# Patient Record
Sex: Male | Born: 1935 | Race: White | Hispanic: No | Marital: Married | Smoking: Former smoker
Health system: Southern US, Community
[De-identification: ages and names within clinical notes are randomized; demographics above are authoritative.]

## PROBLEM LIST (undated history)

## (undated) DIAGNOSIS — R053 Chronic cough: Secondary | ICD-10-CM

## (undated) DIAGNOSIS — E7800 Pure hypercholesterolemia, unspecified: Secondary | ICD-10-CM

## (undated) DIAGNOSIS — K5730 Diverticulosis of large intestine without perforation or abscess without bleeding: Secondary | ICD-10-CM

## (undated) DIAGNOSIS — Z85828 Personal history of other malignant neoplasm of skin: Secondary | ICD-10-CM

## (undated) DIAGNOSIS — Z8673 Personal history of transient ischemic attack (TIA), and cerebral infarction without residual deficits: Secondary | ICD-10-CM

## (undated) DIAGNOSIS — Z860100 Personal history of colon polyps, unspecified: Secondary | ICD-10-CM

## (undated) DIAGNOSIS — Z973 Presence of spectacles and contact lenses: Secondary | ICD-10-CM

## (undated) DIAGNOSIS — C679 Malignant neoplasm of bladder, unspecified: Secondary | ICD-10-CM

## (undated) DIAGNOSIS — R05 Cough: Secondary | ICD-10-CM

## (undated) DIAGNOSIS — I10 Essential (primary) hypertension: Secondary | ICD-10-CM

## (undated) DIAGNOSIS — G4733 Obstructive sleep apnea (adult) (pediatric): Secondary | ICD-10-CM

## (undated) DIAGNOSIS — C8590 Non-Hodgkin lymphoma, unspecified, unspecified site: Secondary | ICD-10-CM

## (undated) DIAGNOSIS — M1990 Unspecified osteoarthritis, unspecified site: Secondary | ICD-10-CM

## (undated) DIAGNOSIS — I6523 Occlusion and stenosis of bilateral carotid arteries: Secondary | ICD-10-CM

## (undated) DIAGNOSIS — D696 Thrombocytopenia, unspecified: Secondary | ICD-10-CM

## (undated) DIAGNOSIS — I2510 Atherosclerotic heart disease of native coronary artery without angina pectoris: Secondary | ICD-10-CM

## (undated) DIAGNOSIS — N402 Nodular prostate without lower urinary tract symptoms: Secondary | ICD-10-CM

## (undated) DIAGNOSIS — Z860101 Personal history of adenomatous and serrated colon polyps: Secondary | ICD-10-CM

## (undated) HISTORY — PX: MOHS SURGERY: SUR867

## (undated) HISTORY — PX: CARDIOVASCULAR STRESS TEST: SHX262

## (undated) HISTORY — DX: Personal history of colonic polyps: Z86.010

## (undated) HISTORY — DX: Essential (primary) hypertension: I10

## (undated) HISTORY — DX: Personal history of colon polyps, unspecified: Z86.0100

## (undated) HISTORY — PX: CORONARY ANGIOPLASTY WITH STENT PLACEMENT: SHX49

## (undated) HISTORY — PX: TONSILLECTOMY: SUR1361

## (undated) HISTORY — DX: Thrombocytopenia, unspecified: D69.6

## (undated) HISTORY — DX: Diverticulosis of large intestine without perforation or abscess without bleeding: K57.30

## (undated) HISTORY — DX: Pure hypercholesterolemia, unspecified: E78.00

---

## 1993-12-01 HISTORY — PX: CORONARY ANGIOPLASTY: SHX604

## 2002-01-05 ENCOUNTER — Ambulatory Visit (HOSPITAL_COMMUNITY): Admission: RE | Admit: 2002-01-05 | Discharge: 2002-01-06 | Payer: Self-pay | Admitting: Cardiovascular Disease

## 2002-03-03 ENCOUNTER — Encounter: Payer: Self-pay | Admitting: Specialist

## 2002-03-07 ENCOUNTER — Inpatient Hospital Stay (HOSPITAL_COMMUNITY): Admission: RE | Admit: 2002-03-07 | Discharge: 2002-03-10 | Payer: Self-pay | Admitting: Specialist

## 2002-03-07 HISTORY — PX: TOTAL KNEE ARTHROPLASTY: SHX125

## 2002-03-10 ENCOUNTER — Inpatient Hospital Stay (HOSPITAL_COMMUNITY)
Admission: AD | Admit: 2002-03-10 | Discharge: 2002-03-17 | Payer: Self-pay | Admitting: Physical Medicine & Rehabilitation

## 2002-11-29 ENCOUNTER — Encounter: Payer: Self-pay | Admitting: Gastroenterology

## 2002-11-29 ENCOUNTER — Ambulatory Visit (HOSPITAL_COMMUNITY): Admission: RE | Admit: 2002-11-29 | Discharge: 2002-11-29 | Payer: Self-pay | Admitting: Gastroenterology

## 2002-12-19 ENCOUNTER — Encounter (INDEPENDENT_AMBULATORY_CARE_PROVIDER_SITE_OTHER): Payer: Self-pay | Admitting: Specialist

## 2002-12-19 ENCOUNTER — Ambulatory Visit (HOSPITAL_COMMUNITY): Admission: RE | Admit: 2002-12-19 | Discharge: 2002-12-19 | Payer: Self-pay | Admitting: Oncology

## 2002-12-19 ENCOUNTER — Encounter: Payer: Self-pay | Admitting: Oncology

## 2003-01-10 ENCOUNTER — Encounter: Payer: Self-pay | Admitting: Oncology

## 2003-01-10 ENCOUNTER — Ambulatory Visit (HOSPITAL_COMMUNITY): Admission: RE | Admit: 2003-01-10 | Discharge: 2003-01-10 | Payer: Self-pay | Admitting: Oncology

## 2003-01-20 ENCOUNTER — Encounter: Payer: Self-pay | Admitting: Oncology

## 2003-01-20 ENCOUNTER — Encounter (INDEPENDENT_AMBULATORY_CARE_PROVIDER_SITE_OTHER): Payer: Self-pay | Admitting: Specialist

## 2003-01-20 ENCOUNTER — Ambulatory Visit (HOSPITAL_COMMUNITY): Admission: RE | Admit: 2003-01-20 | Discharge: 2003-01-20 | Payer: Self-pay | Admitting: Oncology

## 2003-02-01 ENCOUNTER — Encounter: Payer: Self-pay | Admitting: Oncology

## 2003-02-01 ENCOUNTER — Ambulatory Visit (HOSPITAL_COMMUNITY): Admission: RE | Admit: 2003-02-01 | Discharge: 2003-02-01 | Payer: Self-pay | Admitting: Oncology

## 2003-02-02 ENCOUNTER — Encounter (INDEPENDENT_AMBULATORY_CARE_PROVIDER_SITE_OTHER): Payer: Self-pay

## 2003-02-02 ENCOUNTER — Other Ambulatory Visit: Admission: RE | Admit: 2003-02-02 | Discharge: 2003-02-02 | Payer: Self-pay | Admitting: Oncology

## 2003-04-10 ENCOUNTER — Encounter: Payer: Self-pay | Admitting: Oncology

## 2003-04-10 ENCOUNTER — Ambulatory Visit (HOSPITAL_COMMUNITY): Admission: RE | Admit: 2003-04-10 | Discharge: 2003-04-10 | Payer: Self-pay | Admitting: Oncology

## 2003-07-29 ENCOUNTER — Emergency Department (HOSPITAL_COMMUNITY): Admission: EM | Admit: 2003-07-29 | Discharge: 2003-07-29 | Payer: Self-pay | Admitting: Emergency Medicine

## 2003-10-02 ENCOUNTER — Ambulatory Visit (HOSPITAL_COMMUNITY): Admission: RE | Admit: 2003-10-02 | Discharge: 2003-10-02 | Payer: Self-pay | Admitting: Oncology

## 2004-01-08 ENCOUNTER — Ambulatory Visit (HOSPITAL_COMMUNITY): Admission: RE | Admit: 2004-01-08 | Discharge: 2004-01-08 | Payer: Self-pay | Admitting: Oncology

## 2004-07-08 ENCOUNTER — Ambulatory Visit (HOSPITAL_COMMUNITY): Admission: RE | Admit: 2004-07-08 | Discharge: 2004-07-08 | Payer: Self-pay | Admitting: Oncology

## 2004-10-04 ENCOUNTER — Ambulatory Visit: Payer: Self-pay | Admitting: Oncology

## 2005-01-09 ENCOUNTER — Ambulatory Visit: Payer: Self-pay | Admitting: Oncology

## 2005-04-30 ENCOUNTER — Inpatient Hospital Stay (HOSPITAL_COMMUNITY): Admission: RE | Admit: 2005-04-30 | Discharge: 2005-05-01 | Payer: Self-pay | Admitting: Orthopedic Surgery

## 2005-04-30 HISTORY — PX: TOTAL SHOULDER ARTHROPLASTY: SHX126

## 2005-06-10 ENCOUNTER — Ambulatory Visit: Payer: Self-pay | Admitting: Oncology

## 2005-06-13 ENCOUNTER — Ambulatory Visit (HOSPITAL_COMMUNITY): Admission: RE | Admit: 2005-06-13 | Discharge: 2005-06-13 | Payer: Self-pay | Admitting: Oncology

## 2005-09-15 ENCOUNTER — Ambulatory Visit: Payer: Self-pay | Admitting: Pulmonary Disease

## 2005-11-19 ENCOUNTER — Ambulatory Visit: Payer: Self-pay | Admitting: Pulmonary Disease

## 2005-12-17 ENCOUNTER — Ambulatory Visit: Payer: Self-pay | Admitting: Oncology

## 2005-12-17 ENCOUNTER — Ambulatory Visit: Payer: Self-pay | Admitting: Gastroenterology

## 2005-12-22 ENCOUNTER — Ambulatory Visit: Payer: Self-pay | Admitting: Pulmonary Disease

## 2006-01-09 ENCOUNTER — Ambulatory Visit: Payer: Self-pay | Admitting: Cardiovascular Disease

## 2006-01-28 ENCOUNTER — Ambulatory Visit: Payer: Self-pay | Admitting: Gastroenterology

## 2006-02-03 ENCOUNTER — Encounter (INDEPENDENT_AMBULATORY_CARE_PROVIDER_SITE_OTHER): Payer: Self-pay | Admitting: *Deleted

## 2006-02-03 ENCOUNTER — Ambulatory Visit: Payer: Self-pay | Admitting: Gastroenterology

## 2006-02-04 ENCOUNTER — Ambulatory Visit (HOSPITAL_COMMUNITY): Admission: RE | Admit: 2006-02-04 | Discharge: 2006-02-04 | Payer: Self-pay | Admitting: Gastroenterology

## 2006-04-02 ENCOUNTER — Ambulatory Visit: Payer: Self-pay | Admitting: Oncology

## 2006-04-06 LAB — CBC WITH DIFFERENTIAL/PLATELET
Basophils Absolute: 0 10*3/uL (ref 0.0–0.1)
Eosinophils Absolute: 0.2 10*3/uL (ref 0.0–0.5)
HCT: 43.2 % (ref 38.7–49.9)
HGB: 15.1 g/dL (ref 13.0–17.1)
LYMPH%: 18.2 % (ref 14.0–48.0)
MCV: 97.5 fL (ref 81.6–98.0)
MONO#: 0.7 10*3/uL (ref 0.1–0.9)
MONO%: 8.4 % (ref 0.0–13.0)
NEUT#: 6.2 10*3/uL (ref 1.5–6.5)
NEUT%: 70.7 % (ref 40.0–75.0)
Platelets: 156 10*3/uL (ref 145–400)
RBC: 4.44 10*6/uL (ref 4.20–5.71)
WBC: 8.7 10*3/uL (ref 4.0–10.0)

## 2006-04-06 LAB — COMPREHENSIVE METABOLIC PANEL
ALT: 23 U/L (ref 0–40)
BUN: 18 mg/dL (ref 6–23)
CO2: 27 mEq/L (ref 19–32)
Creatinine, Ser: 1.2 mg/dL (ref 0.4–1.5)
Total Bilirubin: 0.8 mg/dL (ref 0.3–1.2)

## 2006-04-06 LAB — LACTATE DEHYDROGENASE: LDH: 176 U/L (ref 94–250)

## 2006-04-09 ENCOUNTER — Ambulatory Visit (HOSPITAL_COMMUNITY): Admission: RE | Admit: 2006-04-09 | Discharge: 2006-04-09 | Payer: Self-pay | Admitting: Oncology

## 2006-09-14 ENCOUNTER — Ambulatory Visit: Payer: Self-pay | Admitting: Pulmonary Disease

## 2006-10-08 ENCOUNTER — Ambulatory Visit: Payer: Self-pay | Admitting: Oncology

## 2006-10-12 LAB — CBC WITH DIFFERENTIAL/PLATELET
Basophils Absolute: 0 10*3/uL (ref 0.0–0.1)
Eosinophils Absolute: 0.2 10*3/uL (ref 0.0–0.5)
HGB: 15.3 g/dL (ref 13.0–17.1)
MCV: 97.9 fL (ref 81.6–98.0)
MONO#: 0.7 10*3/uL (ref 0.1–0.9)
MONO%: 9.5 % (ref 0.0–13.0)
NEUT#: 5.2 10*3/uL (ref 1.5–6.5)
Platelets: 145 10*3/uL (ref 145–400)
RDW: 13.4 % (ref 11.2–14.6)
WBC: 7.7 10*3/uL (ref 4.0–10.0)

## 2007-01-06 ENCOUNTER — Ambulatory Visit: Payer: Self-pay | Admitting: Oncology

## 2007-04-02 ENCOUNTER — Ambulatory Visit: Payer: Self-pay | Admitting: Oncology

## 2007-04-05 ENCOUNTER — Ambulatory Visit (HOSPITAL_COMMUNITY): Admission: RE | Admit: 2007-04-05 | Discharge: 2007-04-05 | Payer: Self-pay | Admitting: Oncology

## 2007-04-27 ENCOUNTER — Ambulatory Visit: Payer: Self-pay | Admitting: Pulmonary Disease

## 2007-04-27 LAB — CONVERTED CEMR LAB
ALT: 23 units/L (ref 0–40)
AST: 29 units/L (ref 0–37)
BUN: 18 mg/dL (ref 6–23)
Basophils Absolute: 0 10*3/uL (ref 0.0–0.1)
Bilirubin, Direct: 0.2 mg/dL (ref 0.0–0.3)
Calcium: 9.1 mg/dL (ref 8.4–10.5)
Chloride: 105 meq/L (ref 96–112)
Creatinine, Ser: 1.1 mg/dL (ref 0.4–1.5)
HCT: 44.5 % (ref 39.0–52.0)
HDL: 45.3 mg/dL (ref >39.0)
LDL Cholesterol: 63 mg/dL (ref 0–99)
Lymphocytes Relative: 23.9 % (ref 12.0–46.0)
MCHC: 34 g/dL (ref 30.0–36.0)
Monocytes Relative: 9.2 % (ref 3.0–11.0)
PSA: 0.76 ng/mL (ref 0.10–4.00)
Platelets: 126 10*3/uL — ABNORMAL LOW (ref 150–400)
Potassium: 4.1 meq/L (ref 3.5–5.1)
Sodium: 140 meq/L (ref 135–145)
TSH: 1.6 microintl units/mL (ref 0.35–5.50)
VLDL: 17 mg/dL (ref 0–40)
WBC: 7 10*3/uL (ref 4.5–10.5)

## 2007-05-03 ENCOUNTER — Ambulatory Visit: Payer: Self-pay | Admitting: Pulmonary Disease

## 2007-10-01 ENCOUNTER — Ambulatory Visit: Payer: Self-pay | Admitting: Oncology

## 2007-10-05 LAB — CBC WITH DIFFERENTIAL/PLATELET
Basophils Absolute: 0.1 10*3/uL (ref 0.0–0.1)
Eosinophils Absolute: 0.3 10*3/uL (ref 0.0–0.5)
HCT: 42.9 % (ref 38.7–49.9)
HGB: 15 g/dL (ref 13.0–17.1)
MCH: 34 pg — ABNORMAL HIGH (ref 28.0–33.4)
MONO#: 1 10*3/uL — ABNORMAL HIGH (ref 0.1–0.9)
NEUT#: 7.8 10*3/uL — ABNORMAL HIGH (ref 1.5–6.5)
NEUT%: 72.7 % (ref 40.0–75.0)
RDW: 13.6 % (ref 11.2–14.6)
WBC: 10.7 10*3/uL — ABNORMAL HIGH (ref 4.0–10.0)
lymph#: 1.6 10*3/uL (ref 0.9–3.3)

## 2008-03-08 ENCOUNTER — Encounter: Payer: Self-pay | Admitting: Pulmonary Disease

## 2008-04-07 ENCOUNTER — Ambulatory Visit: Payer: Self-pay | Admitting: Oncology

## 2008-04-11 ENCOUNTER — Encounter: Payer: Self-pay | Admitting: Pulmonary Disease

## 2008-04-11 LAB — CBC WITH DIFFERENTIAL/PLATELET
Basophils Absolute: 0.1 10*3/uL (ref 0.0–0.1)
EOS%: 2.4 % (ref 0.0–7.0)
Eosinophils Absolute: 0.2 10*3/uL (ref 0.0–0.5)
HGB: 15.1 g/dL (ref 13.0–17.1)
NEUT#: 5.5 10*3/uL (ref 1.5–6.5)
RBC: 4.58 10*6/uL (ref 4.20–5.71)
RDW: 11.8 % (ref 11.2–14.6)
lymph#: 1.8 10*3/uL (ref 0.9–3.3)

## 2008-06-26 DIAGNOSIS — K219 Gastro-esophageal reflux disease without esophagitis: Secondary | ICD-10-CM | POA: Insufficient documentation

## 2008-06-26 DIAGNOSIS — I872 Venous insufficiency (chronic) (peripheral): Secondary | ICD-10-CM | POA: Insufficient documentation

## 2008-06-26 DIAGNOSIS — M1990 Unspecified osteoarthritis, unspecified site: Secondary | ICD-10-CM | POA: Insufficient documentation

## 2008-06-26 DIAGNOSIS — D126 Benign neoplasm of colon, unspecified: Secondary | ICD-10-CM | POA: Insufficient documentation

## 2008-06-26 DIAGNOSIS — E7800 Pure hypercholesterolemia, unspecified: Secondary | ICD-10-CM | POA: Insufficient documentation

## 2008-06-26 DIAGNOSIS — I2510 Atherosclerotic heart disease of native coronary artery without angina pectoris: Secondary | ICD-10-CM | POA: Insufficient documentation

## 2008-06-26 DIAGNOSIS — I10 Essential (primary) hypertension: Secondary | ICD-10-CM | POA: Insufficient documentation

## 2008-06-27 ENCOUNTER — Ambulatory Visit: Payer: Self-pay | Admitting: Pulmonary Disease

## 2008-06-27 DIAGNOSIS — J309 Allergic rhinitis, unspecified: Secondary | ICD-10-CM | POA: Insufficient documentation

## 2008-07-04 ENCOUNTER — Ambulatory Visit: Payer: Self-pay | Admitting: Pulmonary Disease

## 2008-07-04 LAB — CONVERTED CEMR LAB
Fecal Occult Blood: NEGATIVE
OCCULT 1: NEGATIVE
OCCULT 2: NEGATIVE
OCCULT 4: NEGATIVE

## 2008-07-09 DIAGNOSIS — D696 Thrombocytopenia, unspecified: Secondary | ICD-10-CM | POA: Insufficient documentation

## 2008-07-09 DIAGNOSIS — K5730 Diverticulosis of large intestine without perforation or abscess without bleeding: Secondary | ICD-10-CM | POA: Insufficient documentation

## 2008-07-09 DIAGNOSIS — C8589 Other specified types of non-Hodgkin lymphoma, extranodal and solid organ sites: Secondary | ICD-10-CM | POA: Insufficient documentation

## 2008-09-06 ENCOUNTER — Ambulatory Visit: Payer: Self-pay | Admitting: Pulmonary Disease

## 2008-10-10 ENCOUNTER — Ambulatory Visit: Payer: Self-pay | Admitting: Oncology

## 2008-10-12 ENCOUNTER — Encounter: Payer: Self-pay | Admitting: Pulmonary Disease

## 2008-10-12 LAB — CBC WITH DIFFERENTIAL/PLATELET
BASO%: 0.4 % (ref 0.0–2.0)
EOS%: 3.5 % (ref 0.0–7.0)
MCH: 34.1 pg — ABNORMAL HIGH (ref 28.0–33.4)
MCHC: 35 g/dL (ref 32.0–35.9)
RBC: 4.5 10*6/uL (ref 4.20–5.71)
RDW: 13 % (ref 11.2–14.6)
lymph#: 1.4 10*3/uL (ref 0.9–3.3)

## 2008-12-21 ENCOUNTER — Ambulatory Visit: Payer: Self-pay | Admitting: Oncology

## 2008-12-21 ENCOUNTER — Ambulatory Visit (HOSPITAL_COMMUNITY): Admission: RE | Admit: 2008-12-21 | Discharge: 2008-12-21 | Payer: Self-pay | Admitting: Oncology

## 2008-12-25 ENCOUNTER — Encounter: Payer: Self-pay | Admitting: Pulmonary Disease

## 2009-04-17 ENCOUNTER — Encounter: Payer: Self-pay | Admitting: Pulmonary Disease

## 2009-07-27 ENCOUNTER — Encounter: Payer: Self-pay | Admitting: Pulmonary Disease

## 2009-08-14 ENCOUNTER — Encounter: Admission: RE | Admit: 2009-08-14 | Discharge: 2009-09-26 | Payer: Self-pay | Admitting: Orthopedic Surgery

## 2009-08-16 ENCOUNTER — Encounter: Payer: Self-pay | Admitting: Pulmonary Disease

## 2009-08-21 ENCOUNTER — Ambulatory Visit: Payer: Self-pay | Admitting: Oncology

## 2009-08-23 ENCOUNTER — Encounter: Payer: Self-pay | Admitting: Pulmonary Disease

## 2009-08-23 LAB — CBC WITH DIFFERENTIAL/PLATELET
BASO%: 0.5 % (ref 0.0–2.0)
EOS%: 3.3 % (ref 0.0–7.0)
HCT: 43.3 % (ref 38.4–49.9)
LYMPH%: 22.4 % (ref 14.0–49.0)
MCH: 34.3 pg — ABNORMAL HIGH (ref 27.2–33.4)
MCHC: 35.1 g/dL (ref 32.0–36.0)
MCV: 97.7 fL (ref 79.3–98.0)
MONO%: 9.8 % (ref 0.0–14.0)
NEUT%: 64 % (ref 39.0–75.0)
Platelets: 135 10*3/uL — ABNORMAL LOW (ref 140–400)
lymph#: 1.6 10*3/uL (ref 0.9–3.3)

## 2009-08-29 ENCOUNTER — Encounter: Payer: Self-pay | Admitting: Pulmonary Disease

## 2009-10-19 ENCOUNTER — Ambulatory Visit: Payer: Self-pay | Admitting: Oncology

## 2009-10-23 ENCOUNTER — Encounter: Payer: Self-pay | Admitting: Pulmonary Disease

## 2010-02-12 ENCOUNTER — Encounter: Payer: Self-pay | Admitting: Pulmonary Disease

## 2010-02-15 ENCOUNTER — Ambulatory Visit: Payer: Self-pay | Admitting: Oncology

## 2010-02-20 ENCOUNTER — Ambulatory Visit: Payer: Self-pay | Admitting: Vascular Surgery

## 2010-02-20 ENCOUNTER — Encounter: Payer: Self-pay | Admitting: Oncology

## 2010-02-20 ENCOUNTER — Ambulatory Visit: Admission: RE | Admit: 2010-02-20 | Discharge: 2010-02-20 | Payer: Self-pay | Admitting: Oncology

## 2010-03-26 ENCOUNTER — Encounter: Payer: Self-pay | Admitting: Pulmonary Disease

## 2010-05-14 ENCOUNTER — Encounter: Payer: Self-pay | Admitting: Pulmonary Disease

## 2010-05-23 ENCOUNTER — Encounter: Payer: Self-pay | Admitting: Pulmonary Disease

## 2010-08-20 ENCOUNTER — Ambulatory Visit: Payer: Self-pay | Admitting: Oncology

## 2010-08-22 ENCOUNTER — Encounter: Payer: Self-pay | Admitting: Pulmonary Disease

## 2010-10-28 ENCOUNTER — Encounter: Payer: Self-pay | Admitting: Pulmonary Disease

## 2010-10-31 ENCOUNTER — Encounter: Payer: Self-pay | Admitting: Pulmonary Disease

## 2010-12-06 ENCOUNTER — Encounter: Payer: Self-pay | Admitting: Pulmonary Disease

## 2010-12-21 ENCOUNTER — Encounter: Payer: Self-pay | Admitting: Oncology

## 2010-12-31 NOTE — Letter (Signed)
Summary: [facility]   Imported By: Sherian Rein 04/17/2010 14:25:14  _____________________________________________________________________  External Attachment:    Type:   Image     Comment:   External Document

## 2010-12-31 NOTE — Letter (Signed)
Summary: [facility]   Imported By: Sherian Rein 09/23/2010 14:19:30  _____________________________________________________________________  External Attachment:    Type:   Image     Comment:   External Document

## 2010-12-31 NOTE — Letter (Signed)
Summary: [facility]  [facility]   Imported By: Lester  06/13/2010 09:34:19  _____________________________________________________________________  External Attachment:    Type:   Image     Comment:   External Document

## 2010-12-31 NOTE — Letter (Signed)
[facility]   Imported By: Sherian Rein 03/11/2010 13:43:05  _____________________________________________________________________  External Attachment:    Type:   Image     Comment:   External Document

## 2010-12-31 NOTE — Letter (Signed)
Summary: [facility]   Imported By: Lester E[place]ka 11/04/2010 08:38:18  _____________________________________________________________________  External Attachment:    Type:   Image     Comment:   External Document

## 2010-12-31 NOTE — Letter (Signed)
Summary: [facility]   Imported By: Sherian Rein 09/03/2010 10:57:39  _____________________________________________________________________  External Attachment:    Type:   Image     Comment:   External Document

## 2011-01-02 NOTE — Letter (Signed)
Summary: [facility]  [facility]   Imported By: Lester Parral 11/21/2010 11:32:59  _____________________________________________________________________  External Attachment:    Type:   Image     Comment:   External Document

## 2011-01-03 NOTE — Letter (Signed)
Summary: Ophthamology/[facility]  Ophthamology/[facility]   Imported By: Sherian Rein 06/06/2010 10:36:37  _____________________________________________________________________  External Attachment:    Type:   Image     Comment:   External Document

## 2011-01-16 NOTE — Letter (Signed)
[facility]   Imported By: Lennie Odor 01/06/2011 10:28:27  _____________________________________________________________________  External Attachment:    Type:   Image     Comment:   External Document

## 2011-02-24 ENCOUNTER — Encounter (HOSPITAL_BASED_OUTPATIENT_CLINIC_OR_DEPARTMENT_OTHER): Payer: PRIVATE HEALTH INSURANCE | Admitting: Oncology

## 2011-02-24 ENCOUNTER — Other Ambulatory Visit: Payer: Self-pay | Admitting: Oncology

## 2011-02-24 DIAGNOSIS — D696 Thrombocytopenia, unspecified: Secondary | ICD-10-CM

## 2011-02-24 DIAGNOSIS — C8589 Other specified types of non-Hodgkin lymphoma, extranodal and solid organ sites: Secondary | ICD-10-CM

## 2011-02-24 DIAGNOSIS — K219 Gastro-esophageal reflux disease without esophagitis: Secondary | ICD-10-CM

## 2011-02-24 DIAGNOSIS — Z23 Encounter for immunization: Secondary | ICD-10-CM

## 2011-02-24 LAB — CBC WITH DIFFERENTIAL/PLATELET
Basophils Absolute: 0 10*3/uL (ref 0.0–0.1)
EOS%: 4 % (ref 0.0–7.0)
Eosinophils Absolute: 0.3 10*3/uL (ref 0.0–0.5)
HCT: 42.6 % (ref 38.4–49.9)
HGB: 15.2 g/dL (ref 13.0–17.1)
MCHC: 35.7 g/dL (ref 32.0–36.0)
MONO#: 0.9 10*3/uL (ref 0.1–0.9)
MONO%: 11.7 % (ref 0.0–14.0)
NEUT#: 5 10*3/uL (ref 1.5–6.5)
NEUT%: 64.1 % (ref 39.0–75.0)
lymph#: 1.5 10*3/uL (ref 0.9–3.3)

## 2011-03-31 ENCOUNTER — Encounter: Payer: Self-pay | Admitting: Pulmonary Disease

## 2011-04-02 ENCOUNTER — Ambulatory Visit (INDEPENDENT_AMBULATORY_CARE_PROVIDER_SITE_OTHER)
Admission: RE | Admit: 2011-04-02 | Discharge: 2011-04-02 | Disposition: A | Discharge status: Home / Self Care | Payer: PRIVATE HEALTH INSURANCE | Source: Ambulatory Visit | Attending: Pulmonary Disease | Admitting: Pulmonary Disease

## 2011-04-02 ENCOUNTER — Encounter: Payer: Self-pay | Admitting: Pulmonary Disease

## 2011-04-02 ENCOUNTER — Ambulatory Visit (INDEPENDENT_AMBULATORY_CARE_PROVIDER_SITE_OTHER): Payer: PRIVATE HEALTH INSURANCE | Admitting: Pulmonary Disease

## 2011-04-02 VITALS — BP 126/80 | HR 57 | Temp 96.8°F | Ht 69.0 in | Wt 181.2 lb

## 2011-04-02 DIAGNOSIS — I10 Essential (primary) hypertension: Secondary | ICD-10-CM

## 2011-04-02 DIAGNOSIS — E7800 Pure hypercholesterolemia, unspecified: Secondary | ICD-10-CM

## 2011-04-02 DIAGNOSIS — I2510 Atherosclerotic heart disease of native coronary artery without angina pectoris: Secondary | ICD-10-CM

## 2011-04-02 DIAGNOSIS — I739 Peripheral vascular disease, unspecified: Secondary | ICD-10-CM

## 2011-04-02 DIAGNOSIS — M1990 Unspecified osteoarthritis, unspecified site: Secondary | ICD-10-CM

## 2011-04-02 DIAGNOSIS — C8589 Other specified types of non-Hodgkin lymphoma, extranodal and solid organ sites: Secondary | ICD-10-CM

## 2011-04-02 DIAGNOSIS — K219 Gastro-esophageal reflux disease without esophagitis: Secondary | ICD-10-CM

## 2011-04-02 DIAGNOSIS — J309 Allergic rhinitis, unspecified: Secondary | ICD-10-CM

## 2011-04-02 MED ORDER — OMEPRAZOLE 20 MG PO CPDR: DELAYED_RELEASE_CAPSULE | ORAL | Status: DC

## 2011-04-02 MED ORDER — TRIAMCINOLONE ACETONIDE(NASAL) 55 MCG/ACT NA INHA: 2.0000 | Freq: Every day | NASAL | Status: DC

## 2011-04-02 NOTE — Patient Instructions (Signed)
Today we updated your meds in our EPIC system...    We gave you samples of NEXIUM to take one daily- 30 min before the evening meal for 3 weeks...    Then plan to change to the [facility] OTC- one tab taken the same way...    (If your symptoms don't resolve on the Rx then we will send you to the Gastroenterologist for further eval)...  Today we did your follow up CXR...    Please call the PHONE TREE in a few days for your results...    Dial [id number] & when prompted enter your patient number followed by the # symbol...    Your patient number is:  [id number]  Please collect the stool cards and mail them back to [place] so we can check for hidden blood... Don't forget to have [facility] check your PSA & Thyroid function at your next lab draw... Call for any problems.Marland KitchenMarland Kitchen

## 2011-04-08 ENCOUNTER — Other Ambulatory Visit: Payer: PRIVATE HEALTH INSURANCE

## 2011-04-08 ENCOUNTER — Other Ambulatory Visit: Payer: Self-pay | Admitting: Pulmonary Disease

## 2011-04-08 DIAGNOSIS — Z1289 Encounter for screening for malignant neoplasm of other sites: Secondary | ICD-10-CM

## 2011-04-08 LAB — HEMOCCULT SLIDES (X 3 CARDS)
Fecal Occult Blood: NEGATIVE
OCCULT 2: NEGATIVE

## 2011-04-13 ENCOUNTER — Encounter: Payer: Self-pay | Admitting: Pulmonary Disease

## 2011-04-13 NOTE — Progress Notes (Signed)
Subjective:    Patient ID: GIBSON LAD, male    DOB: 1923-12-29, 77 y.o.   MRN: [id number]  HPI 77 y/o WM here for a follow up visit... he has multiple medical problems as noted below...   ~  he is followed regularly by [facility] for SEHV> his notes & blood work are reviewed> Hx CAD- s/p rotational atherectomy & stent... DrKelly was to do a sleep study for OSA symptoms- pt declined this test due to the cost involved... he also planned CDopplers- we don't have these results either but pt tells me they were "OK"... Prev we tried to switch him to Simvastatin from VYTORIN to save him $$$, but he indicates that [facility] wants him on the Vytorin instead...  ~  he is followed by DrSherrill for Oncology w/ hx of non-hodgkins lymphoma dx in 1996, his notes are reviewed as well> chemotherapy finished in 2004 & he has a chronic abdominal/ mesenteric mass, and chronic mild thrombocytopenia w/ Plat count ~ 100K...  ~  Apr 02, 2011:  I last saw [name] 7/09 for a yearly check up at that time w/ complaints of sinus drainage treated w/ antihist, Flonase & Astepro at that time... As noted- he is well cared for by [facility] & DrSherrill and they do all his labs etc...  Today he has a few specific requests> wants Rx for NASACORT AQ;  Wants a follow up CXR;  And he's complaining of ~27mo hx of incr reflux symptoms (known HH on prev EGD see below)> & we discussed PPI therapy...  We also reviewed his labs done by [facility] 11/11, and he wants [place] to give him stool cards to chewck for hidden blood...         Problem List:  OPHTHALMOLOGIC PROBLEM>  He was referred by DrStoneburner to DrMartin [facility]> prob retinal vascular occlusion (?noted blurred vision after cat surg)... ~  CDopplers done 5/11 showed mild plaque w/ 50-69% right ICA stenosis  ALLERGIC RHINITIS (ICD-477.9) - c/o nasal drainage- we discussed strategies for control including:  OTC Antihist prn,  Saline mist every 1-2H during the day,  NASACORT AQ 2 sp in each  nostril Qhs...  HYPERTENSION (ICD-401.9) - on ATENOLOL 50mg - taking 1/2 Bid,  LISINOPRIL 10mg /d,  & CALAN 240mg /d...  ~  7/09:  BP = 126/70 and denies HA, fatigue, visual changes, CP, palipit, dizziness, syncope, dyspnea, edema, etc... ~  1/11:  Labs from [facility] w/ normal renal function etc... ~  5/12:  BP= 126/80 & he remains asymptomatic;  CXR today shows left shoulder arthroplasty, advanced arthritic changes in right shoulder, sl eventration right hemidiaph, normal heart size & clear lungs...  ATHEROSCLEROTIC HEART DISEASE (ICD-414.00) - on ASA 81mg Bid,  & IMDUR 60mg /d...  ~  1995:  Found to have a total RCA occlusion w/ collaterals... ~  2003:  He had a complex intervention w/ rotatioal atherectomy of his entire LAD system at mult sites w/ 2 stents... ~  baseline EKG shows NSR, sm infer Q's, otherw NAD... ~  2DEcho 9/06 showed mild asymmetric LVH, septal thickening, mild infer HK, mild TR & mild plm HTN w/ RVsys est ~40 ~  NuclearStressTest 4/08 showed sm defect inferiorly from his known RCA occlusion, normal perfusion anteriorly and laterally where prev LAD rotational atherectomy and stent were done... ~  Repeat nuclear study 12/10 was the same> old scar, no ischemia, & EF= 58%  PERIPHERAL VASCULAR DISEASE (ICD-443.9)  HYPERCHOLESTEROLEMIA (ICD-272.0) - DrKelly switched his Vytorin to CRESTOR 10mg /d, and he follows  the labs. ~  [facility] 3/09 [facility] showed TChol 144, TG 122, HDL 58, LDL 63... ~  FLP 11/11 from [facility] showed TChol 141, TG 125, HDL 55, LDL 61  GERD (ICD-530.81) - last EGD 3/07 by DrSam showed HH, ?stricture- dilated... ~  5/12:  Pt c/o incr GERD symptoms but was only using the Prilosec prn;  Given Nexium samples for 3weeks, then switch back to PRILOSEC 20mg /d...  DIVERTICULOSIS OF COLON (ICD-562.10) & COLONIC POLYPS (ICD-211.3) - last colonoscopy 3/07 showed divertics, hems, and 4mm polyp = tubular adenoma... f/u planned 78yrs...  DEGENERATIVE JOINT DISEASE (ICD-715.90)  - he is s/p left total knee replacement surgery (DrCarter 4/03) and left shoulder arthroplasty (DrNorris 5/06) as well... ~  8/10:  He had full CSpine eval by DrBrooks at [facility] w/ multilevel DDD found...  NON-HODGKIN'S LYMPHOMA (ICD-202.80) & THROMBOCYTOPENIA (ICD-287.5) - Eval and Rx by DrSherrill> on observation since 2004 & stable... ~  last CT Abd 5/08 w/ irreg calcified mesenteric lesion c/w treated lymphoma; calcif in AbdAo, divertics... ~  No change in this abn on follow up scan 1/10 by oncology...  Health Maintenance -  full routine labs from [facility] 3/09 showed PSA= 0.94... ~  Hx skin cancer removed via Moh's...   Past Surgical History  Procedure Date  . Total knee arthroplasty 03-2002    Dr. Montez Morita  . Total shoulder arthroplasty 03-2005    Dr. Ranell Patrick    Outpatient Encounter Prescriptions as of 04/02/2011  Medication Sig Dispense Refill  . Ascorbic Acid (VITAMIN C) 500 MG tablet Take 500 mg by mouth daily.        Marland Kitchen aspirin 81 MG tablet Take 2 tablets by mouth daily      . atenolol (TENORMIN) 50 MG tablet Take 1/2 tablet by mouth two times daily      . B Complex-C-Calcium (B-COMPLEX/VITAMIN C) TABS Take one tablet by mouth every 3 days       . fish oil-omega-3 fatty acids 1000 MG capsule Take one capsule by mouth every 3 days       . isosorbide mononitrate (IMDUR) 60 MG 24 hr tablet Take 60 mg by mouth daily.        Marland Kitchen lisinopril (PRINIVIL,ZESTRIL) 10 MG tablet Take 10 mg by mouth daily.        . Multiple Vitamins-Minerals (MULTIVITAMIN WITH MINERALS) tablet Take one tablet by mouth every 3 days      . rosuvastatin (CRESTOR) 10 MG tablet Take 10 mg by mouth daily.        . verapamil (CALAN-SR) 240 MG CR tablet Take 240 mg by mouth at bedtime.        Marland Kitchen azelastine (ASTELIN) 137 MCG/SPRAY nasal spray 2 sprays by Nasal route 2 (two) times daily. Use in each nostril as directed       . cetirizine (ZYRTEC) 10 MG tablet Take 10 mg by mouth daily.        Marland Kitchen ezetimibe-simvastatin  (VYTORIN) 10-20 MG per tablet Take 1 tablet by mouth at bedtime.        . fluticasone (FLONASE) 50 MCG/ACT nasal spray 2 sprays by Nasal route daily.        Marland Kitchen omeprazole (PRILOSEC) 20 MG capsule Take one tablet by mouth 30 minutes prior to dinner  30 capsule  11  . triamcinolone (NASACORT) 55 MCG/ACT nasal inhaler 2 sprays by Nasal route at bedtime.  1 Inhaler  11    Allergies  Allergen Reactions  . Penicillins  Did not help with strep throat    Current Medications, Allergies, Past Medical History, Past Surgical History, Family History, and Social History were reviewed in [employer] record.   Review of Systems    See HPI - all other systems neg except as noted... The patient denies anorexia, fever, weight loss, weight gain, vision loss, decreased hearing, hoarseness, chest pain, syncope, dyspnea on exertion, peripheral edema, prolonged cough, headaches, hemoptysis, abdominal pain, melena, hematochezia, severe indigestion/heartburn, hematuria, incontinence, muscle weakness, suspicious skin lesions, transient blindness, difficulty walking, depression, unusual weight change, abnormal bleeding, enlarged lymph nodes, and angioedema.     Objective:   Physical Exam    WD, WN, 77 y/o WM in NAD... GENERAL:  Alert & oriented; pleasant & cooperative... HEENT:  [place]/AT, EOM-wnl, PERRLA, EACs-clear, TMs-wnl, NOSE-clear, THROAT-clear & wnl. NECK:  Supple w/ fairROM; no JVD; normal carotid impulses w/o bruits; no thyromegaly or nodules palpated; no lymphadenopathy. CHEST:  Clear to P & A; without wheezes/ rales/ or rhonchi heard...  HEART:  Regular Rhythm;  faint gr1/6 SEM, without rubs or gallops heard... ABDOMEN:  Soft & nontender; normal bowel sounds; no organomegaly, vague fullness mid-abd above umbilicus/ non-tender... EXT: without deformities, mod arthritic changes; no varicose veins/ +venous insuffic/ no edema. NEURO:  CN's intact;  no focal neuro deficits... DERM:   No lesions noted; no rash etc...   Assessment & Plan:   AR>  He requests NASACORT AQ- OK...  HBP>  Controlled on meds, continue same...  ASHD>  Well tended by [facility] & stable on the ASA, & IMDUR w/o angina, etc...  ASPVD>  On ASA bid & DrKelly following CDopplers etc... Retinal vasc occlusion, but everything is stable now...  CHOL>  FLP looks good on the [place]...  GI>  He is c/o incr GERD & we discussed Nexium samples x 3wks the switch to [facility] OTC dailt & stay on this...  LYMPHOMA>  Followed by DrSherrill & stable as noted.Marland KitchenMarland Kitchen

## 2011-04-18 NOTE — H&P (Signed)
NAMELONN, IM NO.:  [id number]   MEDICAL RECORD NO.:  [id number]           PATIENT TYPE:   LOCATION:                                 FACILITY:   PHYSICIAN:  Almedia Balls. Ranell Patrick, M.D. DATE OF BIRTH:  Dec 14, 1923   DATE OF ADMISSION:  04/30/2005  DATE OF DISCHARGE:                                HISTORY & PHYSICAL   CHIEF COMPLAINT:  Left shoulder pain.   HISTORY OF PRESENT ILLNESS:  The patient is a 77 year old male who comes in  complaining about left shoulder pain that has been going on for several  years.  It has been refractory to conservative treatment.  The patient has  had multiple cortisone injections and is having continued stiffness in that  left shoulder and has elected to have a left total shoulder replacement on  04/30/2005,.   ALLERGIES:  The patient has an allergy to PENICILLIN.   MEDICATIONS:  1.  Verapamil 240 mg one and one-half tablets q day.  2.  Atenolol 50 mg q day.  3.  Vytorin 10/20 mg q day.  4.  Lisinopril 10 mg q day.  5.  Isosorbide mononitrate 60 mg q day.  6.  Aspirin 81 mg two q day.   PAST MEDICAL HISTORY:  1.  Hypertension.  2.  Coronary artery disease.  3.  GERD.  4.  Bronchitis.   FAMILY MEDICAL HISTORY:  1.  Rheumatoid arthritis.  2.  Pancreatic cancer.   REVIEW OF SYSTEMS:  Is negative, except for pain with left arm movement  above 90 degrees and he is unable to get it above his head.   PHYSICAL EXAMINATION:  VITAL SIGNS:  Pulse 88, respirations 18, blood  pressure 128/82.  GENERAL:  The patient is a 77 year old healthy appearing male in no acute  distress.  Pleasant mood and affect.  Alert and oriented x3 here in the  office today.  HEAD AND NECK:  Exam shows cranial nerves II-XII  are grossly intact.  Neck  has a full range of motion without any tenderness or difficulty with  movement.  CHEST:  Active breath sounds bilaterally with no wheezes, rhonchi or rales.  HEART:  Shows a regular rate and rhythm.   No murmur.  ABDOMEN:  Active bowel sounds, non-tender, nondistended with a small lipoma  in the epigastric region that is well known to him.  EXTREMITIES:  Shows moderate tenderness to the left shoulder with any type  of forward flexion past 80 degrees or abduction past 70 degrees.  Neurovascularly, he is intact.  SKIN:  Intact bilateral upper and lower extremities.  Skin shows no rashes  or cellulitis.   X-rays shows left shoulder osteoarthritis, severe.   IMPRESSION:  Left shoulder osteoarthritis, severe.   PLAN:  A left shoulder replacement on 04/30/2005 by Almedia Balls. Ranell Patrick, M.D.   Dictated by Donnie Coffin Ferne Coe. for [employer]. Ranell Patrick, M.D.      TBD/MEDQ  D:  04/17/2005  T:  04/17/2005  Job:  [id number]

## 2011-04-18 NOTE — Discharge Summary (Signed)
[place]  Patient:    MAHKI, SPIKES Visit Number: [id number] MRN: [id number]          Service Type: Attending:  Faith Rogue, M.D. Dictated by:   Junie Bame, P.A. Adm. Date:  03/10/02 Disc. Date: 03/17/02   CC:         [name], M.D. [facility]  Lennette Bihari, M.D., [facility]  Jillyn Hidden B. Truett Perna, M.D.   Discharge Summary  DISCHARGE DIAGNOSES: 1. Left total knee arthroplasty secondary to degenerative joint disease. 2. History of cardiovascular disease. 3. History of hypercholesterolemia.  HISTORY OF PRESENT ILLNESS:  The patient is a 77 year old white male with a history of left known knee and failed conservative measures, and elected to undergo a left total knee arthroplasty secondary to DJD on March 07, 2002 by Dr. Montez Morita.  The patient is presently on Coumadin for DVT prophylaxis.  PT reports at this time that the patient is ambulating minimal assist 50 feet with a rolling walker, and transfers from sit to stand with minimal assistance.   Postoperative complications include hemarthrosis and aspiration this A.M.  The patient is transferred to the [facility] Department.  PAST MEDICAL HISTORY:  Significant for cardiovascular disease/MI, increased lipids, hypertension, bronchitis, GERD, and non-Hodgkins lymphoma.  PRIMARY CARE Fredi Geiler:  Lennette Bihari, M.D.  Oncologist, Leighton Roach. Truett Perna, M.D.  PAST SURGICAL HISTORY:  Significant for tonsillectomy, cardiac cath times two, angioplasty in February 2003 and _____________________.  FAMILY HISTORY:  Noncontributory.  ALLERGIES:  PENICILLIN.  SOCIAL HISTORY:  The patient is married, lives in a one-story home with three steps to entry, has ramp.  Wife can assist.  He has three children.  HOSPITAL COURSE:  The patient was admitted to [facility] Department on March 10, 2002 for comprehensive inpatient rehabilitation.  He received more than three hours of PT and OT daily.   Overall he made good progress while in rehab.  The patient had no major significant medical issues that occurred while in rehab.  He remained on Trinsicon p.o. b.i.d. for anemia.  His admission hemoglobin was 10.3.  Repeat hemoglobin was performed on March 17, 2002 and was 12.0, therefore Trinsicon was discontinued.  He remained on Coumadin through his entire stay for DVT prophylaxis.  He received atenolol, Imdur and verapamil for cardiovascular disease.  The patient was on Zocor 20 mg q.h.s. for a history of hypercholesterolemia.  Latest labs indicated his hemoglobin was 12.0, hematocrit 35.2 and latest white blood cell count was 9.4, and platelet count 115,000.  Latest sodium level was 134, potassium 3.8, chloride 101, CO2 27, glucose 104, BUN 19, and creatinine 1.0.  AST 25, ALT 21.  Urine culture; no growth times one day; this was performed on March 10, 2002.  At the time of discharge PT report indicated the patient was ambulating approximately 150 feet with standing walker modified independently.  He could transfer stand to sit and sit to stand modified independently.  Stand to pivot modified independently.  He had bed mobility modified independently.  He had approximately 65 degrees of flexion in his left knee per the CPM machine.  At the time of discharge all vitals were stable.  The patient was discharged home with his family.  Surgical incision demonstrated no significant signs of infection.  DISCHARGE MEDICATIONS: 1. Zocor 20 mg at night. 2. Verapamil 240 mg q.d. 3. Imdur ER 60 MG q.d. 4. Atenolol 50 mg 1/2 tablet q.a.m. and 1 tablet q.p.m. 5. Resume vitamins  E, C, multivitamins and Zantac. 6. Coumadin 2 mg in the afternoon until Apr 06, 2002.  No aspirin, Aleve    or ibuprofen while in Coumadin. 7. Oxycodone 5 mg 1-2 tablets q4-6. hours p.r.n. pain.  DISCHARGE INSTRUCTIONS:  The patient will have a home CPM machine and use walker.  No drinking.  The patient may shower.   Diet restrictions include low-salt.  [facility] for PT, OT and monitor INR; first draw will be March 21, 2002.  FOLLOW-UP:  Dr. Montez Morita within two weeks.  Follow up with Dr. Faith Rogue as needed.  Follow up with primary care Laira Penninger, Dr. Nicki Guadalajara, within four to six weeks. Dictated by:   Junie Bame, P.A. Attending:  Faith Rogue, M.D. DD:  05/18/02 TD:  05/19/02 Job: [id number]

## 2011-04-18 NOTE — Discharge Summary (Signed)
[facility]  Patient:    Derrick Rivera, Derrick Rivera Visit Number: [id number] MRN: [id number]          Service Type: SUR Location: 4W [id number] 01 Attending Physician:  Derrick Rivera Dictated by:   Derrick Rivera, P.A.-C. Admit Date:  03/07/2002 Disc. Date: 03/10/02   CC:         Derrick Rivera. Derrick Rivera, M.D. [facility]  Derrick Rivera, M.D.  Derrick Rivera, M.D.  Derrick Rivera, M.D.   Discharge Summary  ADMITTING DIAGNOSES:  1. End-stage osteoarthritis, left knee.  2. Coronary arterial disease.  3. History of myocardial infarction.  4. Status post cardiac catheterization with angioplasty and stenting February     2003.  5. Status post cardiac catheterization with angioplasty in 1995.  6. Cardiac ejection fraction of 57%.  7. Hyperlipidemia.  8. Hypertension.  9. Bronchitis. 10. Reflux disease. 11. History of non-Hodgkins lymphoma.  DISCHARGE DIAGNOSES:  1. Severe degenerative arthritis with varus deformity, left knee, status post     left total knee replacement arthroplasty.  2. Mild postoperative hemorrhagic anemia.  3. Coronary arterial disease.  4. History of myocardial infarction.  5. Status post cardiac catheterization with angioplasty and stenting February     2003.  6. Status post cardiac catheterization with angioplasty in 1995.  7. Cardiac ejection fraction of 57%.  8. Hyperlipidemia.  9. Hypertension. 10. Bronchitis. 11. Reflux disease. 12. History of non-Hodgkins lymphoma.  PROCEDURE:  The patient was taken to the operating room on March 07, 2002, and underwent a left total knee replacement arthroplasty, surgeon Derrick Rivera, M.D., assistant Derrick Rivera, M.D.  Components used were Osteonics, all cemented, a posterior cruciate-sacrificing knee with a size 11 femur, size 9 tibia, a 28 mm recessed patella, and a 10 mm thick polyethylene insert.  Surgery done under general anesthesia.  CONSULTATIONS:  Rehabilitation  services, Derrick Rivera, M.D.  HISTORY OF PRESENT ILLNESS:  The patient is a 77 year old male who has been evaluated for left knee pain.  The pain has been chronic in nature for some time now.  He was originally scheduled for a total knee back in February 2003, when he underwent preoperative Cardiolite study, which was found to be positive.  He had to undergo cardiac catheterization and placement of cardiac stents.  Since that time he has been rescheduled and cleared from a cardiac standpoint.  He is brought in to undergo a left total knee replacement arthroplasty.  LABORATORY DATA:  CBC on admission:  Hemoglobin 14.4, hematocrit 41.2, white cell count 11.0, red cell count 4.45.  Serial H&Hs were followed throughout hospital course.  Postop hemoglobin 12.3, hematocrit 35.0, last noted H&H was 10.8 and 30.5.  The admission CBC differential showed neutrophils 76, lymphs 14, monos 8, eos 2, basos 0.  PT, PTT on admission were 14.2 and 31, respectively, with an INR of 1.0.  Serial protimes followed per Coumadin protocol.  Last noted PT and INR at time of transfer were 19.2 and 1.9, respectively.  Chemistry panel on admission:  Only minimally elevated BUN of 24, mini-chemistry panel all within normal limits.  Urinalysis on admission showed a trace of ketones, otherwise negative.  Blood group type is O positive.  EKG dated January 21, 2002:  Sinus rhythm of 69 beats per minute, was a confirmed EKG by, it looks like, Derrick Rivera, M.D.  Preoperative chest x-ray dated December 08, 2001:  No change and no active disease.  Knee films on March 03, 2002,  showed three-compartmental degenerative arthritic change, mostly severe medially, loose bodies in the suprapatellar bursa and posterior knee joint space.  HOSPITAL COURSE:  The patient was admitted to [facility] and was taken to the operating room and underwent the above-stated procedure without complications.  The patient tolerated  the procedure well, was later transferred to the recovery room and then to the orthopedic floor to continue postop care.  Vital signs were followed, and they may be found in the graphic section of this chart.  The patient was started on PCA analgesics for pain control following surgery.  He was later weaned over to p.o. analgesics. Daily H&Hs were followed for his hemoglobin.  He was placed on 20% partial weightbearing.  Dressing changes were initiated on postop day 2.  He was weaned off his PCA analgesics over to p.o. analgesics.  Incision was healing well.  He was noted to have a fair amount of swelling postop.  He was slow to progress in physical therapy, only ambulating approximately 10 feet by postop day 2 and only 20 feet by postop day 3.  He was seen by rehab services, and Dr. Riley Rivera felt he would be an appropriate inpatient rehab candidate.  He was slow to progress in physical therapy; therefore, it was decided the patient would be transferred to rehab once there was a bed available.  It was noted on postop day 3, the patient had a fair amount of swelling and was seen by Dr. Ronnell Rivera.  He underwent a bedside procedure utilizing anesthetic and sterile technique.  He underwent aspiration of approximately 60-80 cc of hemarthrosis.  He did improve following the aspiration.  It was noted that a bed became available later that day on March 10, 2002.  The patient was ready for transfer.  DISCHARGE PLAN:  The patient is transferred to the [facility] rehab unit.  DISCHARGE DIAGNOSES:  Please see above.  DISCHARGE MEDICATIONS:  1. Colace 100 mg p.o. b.i.d.  2. Trinsicon one p.o. t.i.d.  3. Coumadin protocol.  4. Atenolol 50 mg p.o. q.d. and 25 mg p.o. q.h.s.  5. Zocor 20 mg p.o. q.d.  6. Imdur 60 mg p.o. q.d.   7. Verapamil SR 240 mg p.o. q.d.  8. Laxative of choice, enema of choice.  9. Reglan 10 mg p.o. q.8h. p.r.n. nausea. 10. Skelaxin 400 mg one or two every six hours as  needed for pain. 11. Restoril 15 mg p.o. q.h.s. p.r.n. sleep. 12. Percocet one or two every four to six hours as needed for pain. 13. Sublingual nitroglycerin 0.4 mg sublingual p.r.n. chest pain.  DISCHARGE INSTRUCTIONS:  Diet:  Cardiac diet.  Activity:  20% partial weightbearing to the left lower extremity.  Continue gait training, ambulation, and PT/OT for ambulation and ADLs while on rehab service.  Further activities:  Range of motion per CPM and therapy.  Dressing change daily.  FOLLOW-UP:  Two weeks from surgery with Dr. Montez Rivera or following the release from the rehab services.  DISPOSITION:  [facility].  CONDITION UPON DISCHARGE:  Improving. Dictated by:   Alexzandrew L. Rivera, P.A.-C. Attending Physician:  Derrick Rivera DD:  03/10/02 TD:  03/10/02 Job: [id number]

## 2011-04-18 NOTE — Cardiovascular Report (Signed)
[place]  Patient:    AUDREY, ELLER Visit Number: [id number] MRN: [id number]          Service Type: CAT Location: 6500 [id number] Attending Physician:  Virgina Evener Dictated by:   Lennette Bihari, M.D. Proc. Date: 01/05/02 Admit Date:  01/05/2002 Discharge Date: 01/06/2002   CC:         [facility], [address] Venia Minks, R.N.             Cardiac Catheterization Laboratory             [facility]. Montez Morita, M.D.                        Cardiac Catheterization  PROCEDURES PERFORMED: Complex percutaneous coronary intervention.  INDICATIONS: The patient is a 77 year old gentleman, with know CAD, and old total right coronary artery occlusion documented by 1995 collaterals. At that time, he underwent an angioplasty of his second marginal vessel of his circumflex. The patient has a history of hypertension and hyperlipidemia. He recently underwent diagnostic cardiac catheterization preoperatively prior to planned surgery after a Cardiolite study suggested scar in the inferior wall with superimposed ischemia in the inferior to inferolateral, ______ segment, as well as mild anterior and lateral to inferolateral ischemia. He underwent diagnostic catheterization on December 31, 2001, at the [facility]. This showed a calcified LAD with 30% proximal narrowing. There was a 40-50% narrowing in the LAD of the diagonal vessel with new 80-85% stenosis after the second diagonal vessel and a previously noted 90-95% apical LAD stenosis. There was no re-stenosis in the prior circumflex marginal dilatation site, and the right coronary artery was occluded with left to right collaterals. Ejection fraction was 57%. He is now referred for high-speed rotational atherectomy of his LAD.  DESCRIPTION OF PROCEDURE: After premedication with Valium intravenously,  the patient was prepped and draped in the usual fashion. The right femoral artery and right femoral vein were punctured anteriorly and a 6 French venous sheath and 8 French arterial sheath were inserted. An FL4 guide was used for the rotational atherectomy procedure. Scout angiography showed 60% proximal LAD after the diagonal vessel with the previously noted 80-85% mid LAD stenosis and a 95% distal LAD stenosis in the calcified segment. A total of 4300 units of weight-adjusted heparin was administered and the patient received Integrilin bolus, and was begun on infusion. A rotafloppy wire was advanced down the LAD. A 1.5 bur was inserted with initial plans to do rotational atherectomy of the mid LAD and distal LAD. However, it became apparent that the proximal site was more narrowed than had been appreciated, then there was difficulty in the bur traversing the proximal segment. For this reason, rotational atherectomy was performed proximally after the diagonal vessel, as well as in the mid and distal LAD segment. This led to significant debulking. The Dyna-Glide was used for bur exchange and a 2.0 bur was then inserted. Rotational atherectomy was performed both proximally and in the mid LAD segment with a 2.0 bur. Balloon dilatation was done and the entire LAD segment with the 2.5 x 20 mm CrossSail balloon. Initially, this was dilated in the apical LAD segment, which was not felt to be stentable, and dilatation was done at  low atmospheres following rotational atherectomy. The balloon was then moved proximally to provide low level inflation at the sites of atherectomy. It was felt that the mid LAD and proximal LAD would be best treated with stenting. A 3.0 x 12 mm S7 stent was then successfully deployed and dilated to 13 atmospheres in the mid LAD segment. A 3.0 x 5 mm S7 stent was successfully deployed proximally in the LAD with careful attention to insert the stent just beyond the large  diagonal vessel. Low level inflation at 1 atmospheres was then made within the stented segments, again at the site of atherectomy. At the completion of the procedure, scout angiography revealed an excellent angiographic result in the entire LAD system. Specifically, the 60% proximal LAD stenosis after diagonal vessel was not reduced to 0%. There was a smooth appearance of the unstented segment between the proximal and mid stent, and the prior 85% mid stenosis was reduced to 0%. The remaining portion of the mid distal LAD was very smooth without any residual disease following debulking and low level balloon inflation. Specifically, the 95% stenosis was reduced to 0%. There was no evidence for dissection. There was TIMI-3 flow.  IMPRESSION: Complex, but successful high-speed rotational atherectomy of the proximal mid and distal left anterior descending, percutaneous transluminal coronary angioplasty of the left anterior descending, and stenting of the proximal and mid left anterior descending segments with the percent diameter stenoses being reduced to 0% at all sites, done with Integrilin and weight-adjusted heparinization. Dictated by:   Lennette Bihari, M.D. Attending Physician:  Virgina Evener DD:  01/05/02 TD:  01/06/02 Job: [id number]

## 2011-04-18 NOTE — Discharge Summary (Signed)
[place]. [facility]  Patient:    Derrick Rivera, Derrick Rivera Visit Number: [id number] MRN: [id number]          Service Type: CAT Location: 6500 [id number] Attending Physician:  Virgina Evener Dictated by:   Darcella Gasman. [place], F.N.P.C. Admit Date:  01/05/2002 Discharge Date: 01/06/2002   CC:         [place] [facility]. Montez Morita, M.D.  [name], M.D. [facility]   Discharge Summary  DISCHARGE DIAGNOSES: 1. Abnormal Cardiolite with surgical clearance. 2. Coronary artery disease.    a. Percutaneous transluminal coronary angioplasty and stent x3 sites in       the left anterior descending.    b. Diffuse coronary disease with ejection fraction of 57%. 3. Hyperlipidemia. 4. Hypertension, controlled.  DISCHARGE CONDITION:  Improved and stable.  PROCEDURES:  January 05, 2002, PTCA and stent x3 sites to the LAD by Lennette Bihari, M.D.  DISCHARGE MEDICATIONS: 1. Plavix 75 mg one daily with food for one month. 2. Enteric coated aspirin 325 mg daily. 3. Calan SR 240 mg daily. 4. Imdur 60 mg daily. 5. Celebrex 200 mg daily, hold for two days. 6. Zocor 20 mg every evening. 7. Atenolol 50 mg in the morning, 25 in the evening. 8. Vitamin C and vitamin E as before. 9. Nitroglycerin sublingual p.r.n. chest pain.  DISCHARGE INSTRUCTIONS: 1. No strenuous activity, no sexual activity, no lifting over 10 pounds for    three days; then resume regular activity. 2. Low fat, low salt diet. 3. Wash cath site with soap and water. Call with any bleeding, swelling or    drainage. 4. No surgery until the middle of March.  Have blood work done Monday,    January 10, 2002. 5. Follow-up with Dr. Tresa Endo on January 21, 2002, at 2 p.m.  HISTORY OF PRESENT ILLNESS:  A 77 year old patient with known history of coronary disease including cath in 1995 which revealed a total RCA occlusion with PTCA of the second marginal branch of his left circumflex artery.  He also had some mild disease in the  LAD at that time.  He has a history of hypertension and hyperlipidemia, no chest tightness or angina symptoms recently.  The patient was seen by Dr. Tresa Endo for preoperative assessment for total knee replacement.  Persantine Cardiolite revealed scar extending from the base into the distal mid ventricle.  There was suggestion of mild anterior wall lateral to inferior lateral ischemia, EF 60%.  The patient then underwent cardiac catheterization at [facility] and was found to have total RCA as before with diffuse disease of the LAD system and mild disease of the circumflex small vessels. EF was 57%.  Plans were made for intervention at [facility]. [facility] of the LAD.  The patient was outpatient admission on January 03, 2002, for complex PCI of the LAD.  He had rotational atherectomy of the proximal, mid, and distal LAD and angioplasty of the distal LAD and low level angioplasty of the atherectomy site and stents were placed in proximal and mid positions.  The patient was placed on 6500 for overnight observation.  PAST MEDICAL HISTORY:  Coronary disease as stated.  Hypertension and hyperlipidemia.  For family history, social history, and review of systems, see H&P.  PHYSICAL EXAMINATION AT DISCHARGE:  GENERAL APPEARANCE:  The patient is an alert and oriented white male in no acute distress.  VITAL SIGNS:  Blood pressure 104/64, pulse 68, respiratory rate 20, temperature 97, room air  oxygen saturation 94%.  LUNGS:  Clear.  CARDIOVASCULAR:  S1 and S2 regular rate and rhythm.  EXTREMITIES:  Right groin cath site stable.  LABORATORY DATA:  Hemoglobin 14.3, hematocrit 40.7, platelets were 126 on Integrelin and wbc 8.0.  Sodium 140, potassium 4.4, BUN 13, creatinine 1, and glucose 97.  Precath labs were similar to these.  INR was 1.1 and PTT 28.  TSH was 1.32.  UA was clear.  EKG post procedure revealed sinus rhythm without acute changes.  HOSPITAL COURSE:   The patient was stable post procedure, placed on 6500. Sheath was pulled without complications.  Platelet count dropped to a low of 126 on Integrelin.  Integrelin was discontinued at 8 a.m. on the morning of January 06, 2002. The patient was ambulated without problems.  Vital signs stable.  Seen by Pearletha Furl. Alanda Amass, M.D., and Dr. Tresa Endo discussed with him knee replacement plans for mid March.  He will see Dr. Tresa Endo back in two weeks.  Please see discharge instruction sheet. Dictated by:   Darcella Gasman [place], F.N.P.C. Attending Physician:  Virgina Evener DD:  01/06/02 TD:  01/07/02 Job: [id number]

## 2011-04-18 NOTE — H&P (Signed)
[facility]  Patient:    Derrick Rivera, SR., Derrick R. Visit Number: [id number] MRN: [id number]          Service Type: Attending:  [place] J. Montez Morita, M.D. Dictated by:   Alexzandrew L. Perkins, P.A.-C. Adm. Date:  03/07/02   CC:         [name], M.D. [facility]  Jillyn Hidden B. Truett Perna, M.D.  Lennette Bihari, M.D.   History and Physical  CHIEF COMPLAINT:  Left knee pain.  HISTORY OF PRESENT ILLNESS:  The patient is a 77 year old male who has been seen and evaluated for left knee pain in the past.  He has a history of chronic left knee pain and originally was scheduled for a total knee replacement back in February 2003.  However, under his preoperative workup he underwent a Cardiolite study which was positive and had to undergo cardiac catheterization.  He apparently had angioplasty and had two to three stents placed.  He has since been cleared and now comes back in for scheduling of his left knee.  He does have end-stage osteoarthritis.  The pain has been progressive and has interfered with his daily activities, and it is felt he would benefit from undergoing a total knee replacement.  The risks and benefits have been discussed, and the patient elects to proceed with surgery.  ALLERGIES:  No known drug allergies.  INTOLERANCES:  PENICILLIN made him "very sick."  CURRENT MEDICATIONS: 1. Sublingual nitroglycerin p.Riveran. 2. Atenolol 50 mg p.o. q.a.m. and 25 mg p.o. q.p.m. 3. Zocor 20 mg p.o. q.d. 4. Celebrex 200 mg p.o. q.d., stopped prior to surgery. 5. Imdur 60 mg p.o. q.d. 6. Calan SR 250 mg p.o. q.d. 7. Aspirin 325 mg p.o. q.d., stopped 1-1/2 weeks before surgery. 8. Plavix 75 mg p.o. q.d., stopped 10 days after catheterization, which is    approximately three weeks ago from the time of dictation.  PAST MEDICAL HISTORY: 1. Coronary artery disease, cardiac ejection fraction of 57%. 2. Hyperlipidemia. 3. Hypertension. 4. Bronchitis. 5. Myocardial  infarction. 6. History of reflux disease. 7. History of non-Hodgkins lymphoma.  PAST SURGICAL HISTORY: 1. Tonsillectomy in 1935. 2. Left knee cartilage surgery in January 1946. 3. Left salivary gland surgery March 1996. 4. Cardiac catheterization x2, both with angioplasty:  In 1995, and then again    with angioplasty and stenting February 2003.  FAMILY HISTORY:  Father deceased, age 76, with a history of heart disease and arthritis.  Mother deceased, age 53, with history of pancreatic cancer.  SOCIAL HISTORY:  He is married.  He is retired.  Denies use of tobacco products or alcohol products.  Has three children.  His wife, Gardiner Ramus, will be assisting him in his postoperative care.  He has a one-story home with no stairs leading into the home.  REVIEW OF SYSTEMS:  GENERAL:  No fevers, chills, or night sweats.  NEUROLOGIC: No seizures, syncope, or paralysis.  RESPIRATORY:  No shortness of breath, productive cough, or hemoptysis.  CARDIOVASCULAR:  Extensive cardiac history with MI and CAD; however, he denies any chest pain or angina or orthopnea at this time.  GASTROINTESTINAL:  The patient does have some reflux.  Denies any nausea, vomiting, diarrhea, or constipation.  No blood or mucous in the stool. GENITOURINARY:  No dysuria, hematuria, or discharge.  MUSCULOSKELETAL: Pertinent to left knee found in history of present illness.  PHYSICAL EXAMINATION:  VITAL SIGNS:  Pulse 68, respirations 14, blood pressure 126/82.  GENERAL:  The patient is a  77 year old male, well-nourished, well-developed. Appears to be in no acute distress.  He is accompanied by his wife.  He is very pleasant at the time of exam.  He appears to be an excellent historian.  HEENT:  Normocephalic, atraumatic.  Pupils are round and reactive.  Oropharynx clear.  EOMs are intact.  NECK:  Supple.  No carotid bruits are appreciated.  CHEST:  Clear to auscultation in anterior and posterior chest walls.   No rhonchi, rales, or wheezing appreciated.  HEART:  The patient appears to have a regular rate and rhythm with an occasional ectopic versus skipped beat.  S1, S2 noted, and this is only an occasional auscultated skipped beat.  ABDOMEN:  Soft, slightly round.  Bowel sounds are present.  No rebound or guarding.  RECTAL, BREASTS, GENITOURINARY:  Not done, not pertinent to present illness.  EXTREMITIES:  Limited to that of left lower extremity.  The patient does have a valgus malalignment in his left knee.  His range of motion is approximately lacking 20 degrees to full extension, and flexion up to 90 degrees.  Painful crepitus, passive range of motion of the left knee.  Motor function is intact. Moving foot and toes well on exam.  IMPRESSION:  1. End-stage osteoarthritis left knee.  2. Coronary artery disease.  3. History of myocardial infarction.  4. Status post cardiac catheterization with angioplasty and stenting February     ______.  5. Status post cardiac catheterization with angioplasty in 1995.  6. Cardiac ejection fraction of 57%.  7. Hyperlipidemia.  8. Hypertension.  9. Bronchitis. 10. Reflux disease. 11. History of non-Hodgkins lymphoma.  PLAN:  The patient will be admitted to [facility] to undergo a left total knee replacement arthroplasty.  His primary physician, Dr. Kriste Basque, and his cardiac physician, Dr. Tresa Endo, will both be notified of the room number on admission and will be consulted if needed for any medical or cardiac assistance with this patient in his postoperative course. Dictated by:   Alexzandrew L. Perkins, P.A.-C. Attending:  [place] J. Montez Morita, M.D. DD:  03/04/02 TD:  03/05/02 Job: [id number]

## 2011-04-18 NOTE — Op Note (Signed)
NAMELENNIS, KORB NO.:  [id number]   MEDICAL RECORD NO.:  [id number]          PATIENT TYPE:  INP   LOCATION:  2899                         FACILITY:  [facility]   PHYSICIAN:  Almedia Balls. Ranell Patrick, M.D. DATE OF BIRTH:  05-15-1924   DATE OF PROCEDURE:  04/30/2005  DATE OF DISCHARGE:                                 OPERATIVE REPORT   PREOPERATIVE DIAGNOSIS:  End-stage arthritis left shoulder.   POSTOPERATIVE DIAGNOSIS:  End-stage arthritis left shoulder.   PROCEDURE PERFORMED:  Left total shoulder arthroplasty using [employer]  system.   ATTENDING SURGEON:  Almedia Balls. Ranell Patrick, M.D.   ASSISTANT:  Donnie Coffin. Dixon, P.A.-C.   ANESTHESIA:  General anesthesia plus interscalene block anesthesia was used.   ESTIMATED BLOOD LOSS:  200 cc.   FLUID REPLACEMENT:  1,700 cc crystalloid.   INSTRUMENT COUNTS:  Correct.   There were no complications.   URINE OUTPUT:  350 cc.   INDICATIONS:  The patient is a 77 year old male who presents with end-stage  osteoarthritis left shoulder.  The patient has failed an extensive  conservative treatment regimen consisting of antiinflammatory medications,  injections, activity restrictions, and therapy.  The patient presents now  for operative total shoulder arthroplasty to relieve pain and improve  function.  Informed consent was obtained.   DESCRIPTION OF THE PROCEDURE:  After an adequate level of anesthesia was  achieved, the patient was positioned in the modified beach chair position.  The shoulder was examined under anesthesia, forward elevation to 120,  abduction to 60 degrees, and external rotation out to about 40-50 degrees.  The shoulder was then sterilely prepped and draped in the usual manner.  A  deltopectoral approach was utilized starting at the coracoid extending to  the deltoid insertion on the anterior shoulder.  Dissection was carried  sharply down through subcutaneous tissues.  Deltopectoral interval was  identified.  Cephalic vein taken laterally with the deltoid.  The proximal 1  cm of pectoralis was released.  The subscapularis was identified after  identifying the clavipectoral fascia and retracting the conjoint tendon  medially.  We identified the axillary nerve with the finger and kept it safe  during surgery.  We next took the subscapularis down off the lesser  tuberosity with a needle-tip Bovie.  Dissection was carried into the joint  capsule.  There was noted to be significant synovial fluid from the toes  expressed.  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  Multiple loose bodies were removed,  approximately 30-50 throughout the procedure.  We removed anterior inferior  capsule and performed an anterior capsular release all the way down to the 6  o'clock position.  The patient's rotator cuff was intact, and again the  subscapularis looked good.  We placed two #2 fiber wire sutures in a  modified Mason-Allen suture technique and then retracted the subscapularis.  We freed up the subscapularis with a full rotator release and released off  the coracoid process such that it had a nice bounce to it and was fully  mobile.  Next, we identified the osteophytes anteriorly inferiorly and  removed those such that it would help [place] without head resection.  We placed  the curved Crego elevator that came on the [employer] set, around  underneath the rotator cuff, placed the elbow at the patient's side, then  externally rotated about 25 degrees to provide [place] 25 degrees of  retroversion.  We made our cut based on a cutting guide such that it would  enter as low as possible without compromising the rotator cuff.  We made our  cut.  At this point, we used the 8 mm reamer to find the patient's canal.  This was very far laterally out near the biceps groove.  The biceps was  intact.  We utilized sequential reamers up to a size 12, and we were  able to  get the size 12 down, and then took the sequential broaches up to a size 12  broach.  We had a nice angle on our cut, and nice retroversion present.  With the size 12 broach in place, we posteriorly translated the humerus, and  that allowed [place] to get all the synovitis and the synovial chondromatosis out  of the posterior shoulder.  We did remove a little bit of posterior shoulder  capsule.  It was extensively involved with the chondromatosis, forming a  large loose body.  Once we did this, there was appropriate release of the  posterior capsule off of the posterior glenoid.  The surgeon was able to  fully take his finger all the way down the anterior glenoid neck and  scapular neck, which was imperative to obtaining appropriate version for our  component.  We sized our glenoid component, and with the large anterior  osteophyte and anterior inferior osteophyte removed and glenoid, we felt  like we had a good handle on the size of the glenoid.  Thus, we selected a  size 48 glenoid, and utilizing initially the __________ glenoid guide, we  placed single drill holes through the center of the 48 guide.  We then  placed the __________ glenoid guide into that central hole which allowed [place]  to make superior, anterior inferior, and anterior posterior holes.  We  placed antirotation pins in place.  We were very happy with things until we  drilled the posterior inferior peg hole, and that was felt to go out the  back of what was felt to be an osteophytic posterior glenoid.  Thus, because  we did not have fully bony support of the posterior peg, and as well there  did appear to be a slight cortical breach at the posterior aspect of the  central peg for the __________ glenoid.  We decided to go ahead and change  to a keeled 48 glenoid.  I went ahead and placed the keeled guide on there,  made our two additional drill holes in a vertical orientation, placing the prosthesis right up against the  anterior lip of the glenoid neck and the  scapular neck.  We used the keel impacter to impact for the 48 and then  placed a trial 48 Keel in with nice stability and nice version.  We were  very happy with that.  Next, we then trialed a 48 mm head.  We got a little  bit better coverage with the 48 eccentric, placing the arrow in the  eccentricity superiorly to fill up the rotator cuff and provide appropriate  attention for that.  That gave [place] perfect coverage.  We selected a 21,  giving [place] the best soft tissue tension.  With the posterior capsular  release, I was able to take the humerus posteriorly about one  glenoid  diameter.  We were not able to dislocate it, but it did perch on the  posterior ledge.  This was felt to be appropriate posterior release, and we  felt like when we repaired the subscapularis in the rotator interval that  this would provide excellent soft tissue balancing.  At this time, we went  ahead and removed trial components.  We went ahead and cemented in the  glenoid after vacuum mixing and then hand packing the cement around the keel  of the glenoid and underneath the glenoid, and then impacting that glenoid  into place and holding it there until the cement was hardened.  We had good  bony support all the way around on the glenoid component, and then we next  thoroughly irrigated, inspected our joint to make sure we were free of loose  bodies, and we were, and then we went ahead and removed our computer  retractor and again inspected the humerus.  We irrigated and dried that and  then placed our size 12 Global shoulder prosthesis, pressfit into the  humerus, obtaining perfect fit.  We placed available bone grafter into the  medial aspect of the bone defect such that the bone was halfway down.  We  placed the bone graft in and then fully seated it.  We had a nice tight fit  in good position and good retroversion.  At this point, we began trial with  the 48 eccentric  head with a 21 mm thickness.  This gave [place] appropriate soft  tissue tension which was exactly as we had trialed, and then we selected the  real eccentric head and packed it into place again pretty much at 12  o'clock, maybe a little bit toward, rotated posteriorly, but had perfect  head coverage.  At this point, we went ahead and repaired subscapularis  through drill holes in the bone and the lesser tuberosity.  Biceps was left  along, as that was in good condition, and going to the supralabrum, which is  component.  We left that alone.  This would give [place] additional control of  the upper humerus and humeral head depression.  At this point, we thoroughly  irrigated the wound.  We went ahead and placed a drain deep to the deltoid  and inspected the axillary nerve as we had at multiple points during the  surgery, and it was noted to be intact,  palpable all the way across its course, and then we went ahead and closed the deltopectoral interval with interrupted Vicryl and then 2-0 subcutaneous  Vicryl, 4-0 Monocryl, Steri-Strips, sterile dressing, and a shoulder sling  immobilizer.  The patient tolerated surgery well and was taken to the  recovery room in stable condition.      SRN/MEDQ  D:  04/30/2005  T:  04/30/2005  Job:  [id number]

## 2011-04-18 NOTE — Op Note (Signed)
[facility]  Patient:    KARAS, PICKERILL Visit Number: [id number] MRN: [id number]          Service Type: Attending:  Philips J. Montez Morita, M.D. Dictated by:   [employer] Montez Morita, M.D. Proc. Date: 03/07/02                             Operative Report  SURGEON:  Philips J. Montez Morita, M.D.  ASSISTANT:  Javier Docker, M.D.  PREOPERATIVE DIAGNOSIS:  Severe degenerative arthritis with varus deformity of left knee.  POSTOPERATIVE DIAGNOSIS:  Severe degenerative arthritis with varus deformity of left knee.  OPERATIVE PROCEDURE:  Left total knee replacement arthroplasty using an [employer] totally cemented system, posterior cruciate sacrificing with a size 11 femur, a 9 tibia, a 28 mm recessed patella and a 10 mm poly tibial tray.  DESCRIPTION OF PROCEDURE:  After suitable spinal anesthesia, the left knee is prepped and draped routinely and after exsanguination, an upper thigh tourniquet is inflated to 350 mmHg. A midline incision is made extending a little bit towards the lateral side over the patella because of the previous medial meniscectomy scar. Minimal skin flaps as thick as possible are taken and anteromedial standard exposure with significant stripping of the medial tibia is done. The remaining posterior cruciate and anterior portion of the lateral meniscus are incised. The osteophytes are removed. The drill is placed through the distal femur and 10 mm of distal femur are resected. Following this, the tibial spines are removed, the tibia subluxed forward and the spines are removed. The distal femur is then sized, it looks like it would take, ideally, an 11 mm looking at the trial implant versus the size on the AP view. The new guide is brought in. The epicondyles are marked and the rotational guide is set at about 3.5 to 4 degrees of external rotation, using this guide rather than the old guide and we were also able to drop it down 2 mm to get a more  flush cut off of the top of the femur. Holes were marked and appropriate cutting guide at size 11 was brought in. The anterior and posterior and chamfering cuts were made on the femur. The tibia was then subluxed forward. A size 11 at first was thought to be the ideal size, central hole was drilled, and elected to take the first cut down to the tibial defect which was a little bit bigger than 10 mm off the lateral defect. Following that cut, the _______ of the femur were cleared of tissue and loose bodies and a trial femur was then put on. Realizing it was already too tight, we took two more off the tibia at a 0 cut angle which allowed [place] to get an 8 mm tibial tray and elected then to take two more off the femur which allowed [place] to get a 10 mm tray in with good stability in mediolateral and no lift-off on the AP and realized then that we did not have to take a 5 degree slope on the tibia. The rotation was marked and then the patella was reamed to accept a 28 recessed patella, then brought the tibia forward and cut the slots for the fins. The wound was irrigated and debrided. The cement was mixed. Cementing was done with the tibia first, femur second, the patella third and held until set. Final trial revealed the 10 mm to be the ideal situation. The patella seemed to  ride nicely without the need of lateral release. There was a little bit of catching over the lateral side which seemed to ease as it was not with the patella but with the soft tissues riding over the edge of the lateral femur, but once the patella was closed, this seemed to stop. The tourniquet was let down prior to putting in the final tibial component to get good hemostasis and the wound was then closed with the tourniquet down. Tourniquet was up for about an hour and 40 minutes. Routine wound closure with #1 PDS in the medial parapatellar area, 2-0 in the subcutaneous of coated Vicryl, and running Monocryl with Steri-Strips on  the skin. Ice compression dressing. He tolerated the surgery well and goes to recovery in good condition. Dictated by:   [employer]. Montez Morita, M.D. Attending:  Philips J. Montez Morita, M.D. DD:  03/08/02 TD:  03/08/02 Job: [id number]

## 2011-05-21 ENCOUNTER — Encounter: Payer: Self-pay | Admitting: Pulmonary Disease

## 2011-08-22 ENCOUNTER — Encounter (HOSPITAL_BASED_OUTPATIENT_CLINIC_OR_DEPARTMENT_OTHER): Payer: PRIVATE HEALTH INSURANCE | Admitting: Oncology

## 2011-08-22 DIAGNOSIS — D696 Thrombocytopenia, unspecified: Secondary | ICD-10-CM

## 2011-08-22 DIAGNOSIS — C8589 Other specified types of non-Hodgkin lymphoma, extranodal and solid organ sites: Secondary | ICD-10-CM

## 2011-08-22 DIAGNOSIS — Z23 Encounter for immunization: Secondary | ICD-10-CM

## 2011-12-02 HISTORY — PX: CATARACT EXTRACTION W/ INTRAOCULAR LENS  IMPLANT, BILATERAL: SHX1307

## 2012-02-02 ENCOUNTER — Telehealth: Payer: Self-pay | Admitting: Oncology

## 2012-02-02 NOTE — Telephone Encounter (Signed)
callled pts home lmovm for appt on 03/22 and to rtn call to confir appt

## 2012-02-05 ENCOUNTER — Telehealth: Payer: Self-pay | Admitting: Oncology

## 2012-02-05 NOTE — Telephone Encounter (Signed)
pt rtn call and confirmed appt for 03/22

## 2012-02-20 ENCOUNTER — Ambulatory Visit (HOSPITAL_BASED_OUTPATIENT_CLINIC_OR_DEPARTMENT_OTHER): Payer: PRIVATE HEALTH INSURANCE | Admitting: Oncology

## 2012-02-20 ENCOUNTER — Telehealth: Payer: Self-pay | Admitting: Oncology

## 2012-02-20 ENCOUNTER — Other Ambulatory Visit (HOSPITAL_BASED_OUTPATIENT_CLINIC_OR_DEPARTMENT_OTHER): Payer: PRIVATE HEALTH INSURANCE | Admitting: Lab

## 2012-02-20 VITALS — BP 94/54 | HR 71 | Temp 97.3°F | Ht 69.0 in | Wt 176.8 lb

## 2012-02-20 DIAGNOSIS — D696 Thrombocytopenia, unspecified: Secondary | ICD-10-CM

## 2012-02-20 DIAGNOSIS — M542 Cervicalgia: Secondary | ICD-10-CM

## 2012-02-20 DIAGNOSIS — C8589 Other specified types of non-Hodgkin lymphoma, extranodal and solid organ sites: Secondary | ICD-10-CM

## 2012-02-20 DIAGNOSIS — K219 Gastro-esophageal reflux disease without esophagitis: Secondary | ICD-10-CM

## 2012-02-20 LAB — CBC WITH DIFFERENTIAL/PLATELET
Basophils Absolute: 0 10*3/uL (ref 0.0–0.1)
EOS%: 3.3 % (ref 0.0–7.0)
Eosinophils Absolute: 0.2 10*3/uL (ref 0.0–0.5)
HCT: 42.1 % (ref 38.4–49.9)
HGB: 14.5 g/dL (ref 13.0–17.1)
MCH: 34 pg — ABNORMAL HIGH (ref 27.2–33.4)
MCV: 98.3 fL — ABNORMAL HIGH (ref 79.3–98.0)
MONO%: 9.5 % (ref 0.0–14.0)
NEUT#: 4.2 10*3/uL (ref 1.5–6.5)
NEUT%: 61.4 % (ref 39.0–75.0)
Platelets: 113 10*3/uL — ABNORMAL LOW (ref 140–400)

## 2012-02-20 NOTE — Progress Notes (Signed)
OFFICE PROGRESS NOTE   INTERVAL HISTORY:   He returns as scheduled. He feels well. No fever, night sweats, or anorexia. No palpable lymph nodes. No abdominal pain.  He continues to undergo plastic surgery for reconstruction of the nose after removal of a basal cell carcinoma.  Objective:  Vital signs in last 24 hours:  Blood pressure 94/54, pulse 71, temperature 97.3 F (36.3 C), temperature source Oral, height 5\' 9"  (1.753 m), weight 176 lb 12.8 oz (80.196 kg).    HEENT: Status post right nose reconstruction, no mass at the neck or left preauricular area Lymphatics: No cervical, supraclavicular, axillary, or inguinal nodes Resp: Lungs clear bilaterally Cardio: Regular rate and rhythm GI: No hepatosplenomegaly, a few fingers superior and to the right side of the umbilicus there is a mass on deep palpation. No other mass. Vascular: No leg edema  Lab Results:  Lab Results  Component Value Date   WBC 6.9 02/20/2012   HGB 14.5 02/20/2012   HCT 42.1 02/20/2012   MCV 98.3* 02/20/2012   PLT 113* 02/20/2012   ANC 4.2    Medications: I have reviewed the patient's current medications.  Assessment/Plan: 1. Non-Hodgkin lymphoma diagnosed in 1996.  He has been maintained off of specific therapy since 2000. a. New lymph nodes in the right neck on exam 08/23/2009. b. Restaging CT evaluation 12/21/2008 revealed a stable mesenteric mass and no evidence for progression of lymphoma. 2. Chronic mild thrombocytopenia. 3. "Mohs" surgery for treatment of a skin cancer at the right side of the nose. a. He has undergone surgery for a local recurrence of the basal cell carcinoma with repeat reconstruction procedures 4. Chronic neck pain.   Disposition:  There is no clinical evidence for progression of the non-Hodgkin's lymphoma. He would like to continue followup at the cancer Center. He will return for an office and lab visit in 9 months.   Lucile Shutters, MD  02/20/2012  5:12  PM

## 2012-02-20 NOTE — Telephone Encounter (Signed)
appts made and printed for pt aom °

## 2012-02-24 ENCOUNTER — Telehealth: Payer: Self-pay | Admitting: Oncology

## 2012-02-24 NOTE — Telephone Encounter (Signed)
called pt lmovm that we added lab appt to 12/03. asked pt to rtn call to confirm

## 2012-03-30 ENCOUNTER — Ambulatory Visit (INDEPENDENT_AMBULATORY_CARE_PROVIDER_SITE_OTHER): Payer: PRIVATE HEALTH INSURANCE | Admitting: Adult Health

## 2012-03-30 ENCOUNTER — Encounter: Payer: Self-pay | Admitting: Adult Health

## 2012-03-30 DIAGNOSIS — B029 Zoster without complications: Secondary | ICD-10-CM | POA: Insufficient documentation

## 2012-03-30 MED ORDER — PREDNISONE 10 MG PO TABS: ORAL_TABLET | ORAL | Status: DC

## 2012-03-30 MED ORDER — VALACYCLOVIR HCL 1 G PO TABS: 1000.0000 mg | ORAL_TABLET | Freq: Three times a day (TID) | ORAL | Status: AC

## 2012-03-30 NOTE — Progress Notes (Signed)
Subjective:    Patient ID: Derrick Rivera, male    DOB: [birth year], 78 y.o.   MRN: [id number]  HPI  78 y/o WM    ~  he is followed regularly by [facility] for SEHV> his notes & blood work are reviewed> Hx CAD- s/p rotational atherectomy & stent... DrKelly was to do a sleep study for OSA symptoms- pt declined this test due to the cost involved... he also planned CDopplers- we don't have these results either but pt tells me they were "OK"... Prev we tried to switch him to Simvastatin from VYTORIN to save him $$$, but he indicates that [facility] wants him on the Vytorin instead...  ~  he is followed by DrSherrill for Oncology w/ hx of non-hodgkins lymphoma dx in 1996, his notes are reviewed as well> chemotherapy finished in 2004 & he has a chronic abdominal/ mesenteric mass, and chronic mild thrombocytopenia w/ Plat count ~ 100K...  ~  Apr 02, 2011:  I last saw Derrick Rivera 7/09 for a yearly check up at that time w/ complaints of sinus drainage treated w/ antihist, Flonase & Astepro at that time... As noted- he is well cared for by [facility] & DrSherrill and they do all his labs etc...  Today he has a few specific requests> wants Rx for NASACORT AQ;  Wants a follow up CXR;  And he's complaining of ~19mo hx of incr reflux symptoms (known HH on prev EGD see below)> & we discussed PPI therapy...  We also reviewed his labs done by [facility] 11/11, and he wants [place] to give him stool cards to chewck for hidden blood...  03/30/2012 Acute OV  Complains of rash on left side of chest, going under the arm and to the back, tender x1day  Mild pain at rash and shoulder.  No drainage , fever or dyspnea. No new meds.  Had chicken pox as child.  No shingles vaccine.             Problem List:  OPHTHALMOLOGIC PROBLEM>  He was referred by DrStoneburner to DrMartin [facility]> prob retinal vascular occlusion (?noted blurred vision after cat surg)... ~  CDopplers done 5/11 showed mild plaque w/ 50-69% right ICA stenosis  ALLERGIC  RHINITIS (ICD-477.9) - c/o nasal drainage- we discussed strategies for control including:  OTC Antihist prn,  Saline mist every 1-2H during the day,  NASACORT AQ 2 sp in each nostril Qhs...  HYPERTENSION (ICD-401.9) - on ATENOLOL 50mg - taking 1/2 Bid,  LISINOPRIL 10mg /d,  & CALAN 240mg /d...  ~  7/09:  BP = 126/70 and denies HA, fatigue, visual changes, CP, palipit, dizziness, syncope, dyspnea, edema, etc... ~  1/11:  Labs from [place] w/ normal renal function etc... ~  5/12:  BP= 126/80 & he remains asymptomatic;  CXR today shows left shoulder arthroplasty, advanced arthritic changes in right shoulder, sl eventration right hemidiaph, normal heart size & clear lungs...  ATHEROSCLEROTIC HEART DISEASE (ICD-414.00) - on ASA 81mg Bid,  & IMDUR 60mg /d...  ~  1995:  Found to have a total RCA occlusion w/ collaterals... ~  2003:  He had a complex intervention w/ rotatioal atherectomy of his entire LAD system at mult sites w/ 2 stents... ~  baseline EKG shows NSR, sm infer Q's, otherw NAD... ~  2DEcho 9/06 showed mild asymmetric LVH, septal thickening, mild infer HK, mild TR & mild plm HTN w/ RVsys est ~40 ~  NuclearStressTest 4/08 showed sm defect inferiorly from his known RCA occlusion, normal perfusion anteriorly and laterally where prev  LAD rotational atherectomy and stent were done... ~  Repeat nuclear study 12/10 was the same> old scar, no ischemia, & EF= 58%  PERIPHERAL VASCULAR DISEASE (ICD-443.9)  HYPERCHOLESTEROLEMIA (ICD-272.0) - DrKelly switched his Vytorin to CRESTOR 10mg /d, and he follows the labs. ~  FLP 3/09 from [facility] showed TChol 144, TG 122, HDL 58, LDL 63... ~  FLP 11/11 from [facility] showed TChol 141, TG 125, HDL 55, LDL 61  GERD (ICD-530.81) - last EGD 3/07 by DrSam showed HH, ?stricture- dilated... ~  5/12:  Pt c/o incr GERD symptoms but was only using the Prilosec prn;  Given Nexium samples for 3weeks, then switch back to PRILOSEC 20mg /d...  DIVERTICULOSIS OF COLON (ICD-562.10)  & COLONIC POLYPS (ICD-211.3) - last colonoscopy 3/07 showed divertics, hems, and 4mm polyp = tubular adenoma... f/u planned 46yrs...  DEGENERATIVE JOINT DISEASE (ICD-715.90) - he is s/p left total knee replacement surgery (DrCarter 4/03) and left shoulder arthroplasty (DrNorris 5/06) as well... ~  8/10:  He had full CSpine eval by DrBrooks at [facility] w/ multilevel DDD found...  NON-HODGKIN'S LYMPHOMA (ICD-202.80) & THROMBOCYTOPENIA (ICD-287.5) - Eval and Rx by DrSherrill> on observation since 2004 & stable... ~  last CT Abd 5/08 w/ irreg calcified mesenteric lesion c/w treated lymphoma; calcif in AbdAo, divertics... ~  No change in this abn on follow up scan 1/10 by oncology...  Health Maintenance -  full routine labs from [facility] 3/09 showed PSA= 0.94... ~  Hx skin cancer removed via Moh's...   Past Surgical History  Procedure Date  . Total knee arthroplasty 03-2002    Dr. Montez Morita  . Total shoulder arthroplasty 03-2005    Dr. Ranell Patrick    Outpatient Encounter Prescriptions as of 03/30/2012  Medication Sig Dispense Refill  . Ascorbic Acid (VITAMIN C) 500 MG tablet Take 500 mg by mouth daily.        Marland Kitchen aspirin 81 MG tablet Take 2 tablets by mouth daily      . atenolol (TENORMIN) 50 MG tablet Take 1/2 tablet by mouth two times daily      . azelastine (ASTELIN) 137 MCG/SPRAY nasal spray 2 sprays by Nasal route 2 (two) times daily. Use in each nostril as directed       . B Complex-C-Calcium (B-COMPLEX/VITAMIN C) TABS Take one tablet by mouth every 3 days       . fish oil-omega-3 fatty acids 1000 MG capsule Take one capsule by mouth every 3 days       . fluticasone (FLONASE) 50 MCG/ACT nasal spray 2 sprays by Nasal route daily.        . isosorbide mononitrate (IMDUR) 60 MG 24 hr tablet Take 60 mg by mouth daily.        Marland Kitchen lisinopril (PRINIVIL,ZESTRIL) 10 MG tablet Take 10 mg by mouth daily.        . Multiple Vitamins-Minerals (MULTIVITAMIN WITH MINERALS) tablet Take one tablet by mouth every 3  days      . omeprazole (PRILOSEC) 20 MG capsule Take one tablet by mouth 30 minutes prior to dinner  30 capsule  11  . rosuvastatin (CRESTOR) 10 MG tablet Take 10 mg by mouth daily.        Marland Kitchen triamcinolone (NASACORT) 55 MCG/ACT nasal inhaler 2 sprays by Nasal route at bedtime.  1 Inhaler  11  . verapamil (CALAN-SR) 240 MG CR tablet Take 240 mg by mouth at bedtime.        Marland Kitchen DISCONTD: cetirizine (ZYRTEC) 10 MG tablet Take 10  mg by mouth daily.          Allergies  Allergen Reactions  . Penicillins     Did not help with strep throat    Current Medications, Allergies, Past Medical History, Past Surgical History, Family History, and Social History were reviewed in [employer] record.   Review of Systems     Constitutional:   No  weight loss, night sweats,  Fevers, chills, fatigue, or  lassitude.  HEENT:   No headaches,  Difficulty swallowing,  Tooth/dental problems, or  Sore throat,                No sneezing, itching, ear ache, nasal congestion, post nasal drip,   CV:  No chest pain,  Orthopnea, PND, swelling in lower extremities, anasarca, dizziness, palpitations, syncope.   GI  No heartburn, indigestion, abdominal pain, nausea, vomiting, diarrhea, change in bowel habits, loss of appetite, bloody stools.   Resp: No shortness of breath with exertion or at rest.  No excess mucus, no productive cough,  No non-productive cough,  No coughing up of blood.  No change in color of mucus.  No wheezing.  No chest wall deformity  Skin:+ rash   GU: no dysuria, change in color of urine, no urgency or frequency.  No flank pain, no hematuria   MS:  No joint pain or swelling.  No decreased range of motion.  No back pain.  Psych:  No change in mood or affect. No depression or anxiety.  No memory loss.       Objective:   Physical Exam     WD, WN, 78 y/o WM in NAD... GENERAL:  Alert & oriented; pleasant & cooperative... HEENT:  [place]/AT,   EACs-clear, TMs-wnl, NOSE-clear,  THROAT-clear & wnl. NECK:  Supple w/ fairROM; no JVD; normal carotid impulses w/o bruits; no thyromegaly or nodules palpated; no lymphadenopathy. CHEST:  Clear to P & A; without wheezes/ rales/ or rhonchi heard...  HEART:  Regular Rhythm;  faint gr1/6 SEM, without rubs or gallops heard... ABDOMEN:  Soft & nontender; normal bowel sounds; no organomegaly,   EXT: without deformities, mod arthritic changes; no varicose veins/ +venous insuffic/ no edema. NEURO:    no focal neuro deficits... DERM:  Scattered patches of vesicles along the left anterior /posterior chest wall in a T7 dermatonal distribution    Assessment & Plan:

## 2012-03-30 NOTE — Patient Instructions (Signed)
Valtrex 1000mg Three times a day  For 7 days  Prednisone taper over next week.  Tylenol As needed  Pain May use Capsacin cream for pain once rash has crusted over.  Please contact office for sooner follow up if symptoms do not improve or worsen or seek emergency care  follow up Dr. Nadel  In 1-2 months for routine follow up .   

## 2012-03-30 NOTE — Assessment & Plan Note (Signed)
Valtrex 1000mg  Three times a day  For 7 days  Prednisone taper over next week.  Tylenol As needed  Pain May use Capsacin cream for pain once rash has crusted over.  Please contact office for sooner follow up if symptoms do not improve or worsen or seek emergency care  follow up Dr. Kriste Basque  In 1-2 months for routine follow up .

## 2012-04-21 ENCOUNTER — Other Ambulatory Visit: Payer: Self-pay | Admitting: Pulmonary Disease

## 2012-04-21 MED ORDER — OMEPRAZOLE 20 MG PO CPDR: DELAYED_RELEASE_CAPSULE | ORAL | Status: DC

## 2012-04-29 HISTORY — PX: TRANSTHORACIC ECHOCARDIOGRAM: SHX275

## 2012-05-26 ENCOUNTER — Encounter: Payer: Self-pay | Admitting: Pulmonary Disease

## 2012-05-26 ENCOUNTER — Ambulatory Visit (INDEPENDENT_AMBULATORY_CARE_PROVIDER_SITE_OTHER): Payer: PRIVATE HEALTH INSURANCE | Admitting: Pulmonary Disease

## 2012-05-26 VITALS — BP 120/62 | HR 67 | Temp 97.6°F | Ht 69.0 in | Wt 177.2 lb

## 2012-05-26 DIAGNOSIS — E7800 Pure hypercholesterolemia, unspecified: Secondary | ICD-10-CM

## 2012-05-26 DIAGNOSIS — J309 Allergic rhinitis, unspecified: Secondary | ICD-10-CM

## 2012-05-26 DIAGNOSIS — D126 Benign neoplasm of colon, unspecified: Secondary | ICD-10-CM

## 2012-05-26 DIAGNOSIS — K5730 Diverticulosis of large intestine without perforation or abscess without bleeding: Secondary | ICD-10-CM

## 2012-05-26 DIAGNOSIS — K219 Gastro-esophageal reflux disease without esophagitis: Secondary | ICD-10-CM

## 2012-05-26 DIAGNOSIS — I739 Peripheral vascular disease, unspecified: Secondary | ICD-10-CM

## 2012-05-26 DIAGNOSIS — M1990 Unspecified osteoarthritis, unspecified site: Secondary | ICD-10-CM

## 2012-05-26 DIAGNOSIS — C8589 Other specified types of non-Hodgkin lymphoma, extranodal and solid organ sites: Secondary | ICD-10-CM

## 2012-05-26 DIAGNOSIS — I2510 Atherosclerotic heart disease of native coronary artery without angina pectoris: Secondary | ICD-10-CM

## 2012-05-26 DIAGNOSIS — I10 Essential (primary) hypertension: Secondary | ICD-10-CM

## 2012-05-26 MED ORDER — FLUTICASONE PROPIONATE 50 MCG/ACT NA SUSP: 2.0000 | Freq: Every day | NASAL | Status: DC

## 2012-05-26 MED ORDER — OMEPRAZOLE 20 MG PO CPDR: DELAYED_RELEASE_CAPSULE | ORAL | Status: DC

## 2012-05-26 MED ORDER — AZELASTINE HCL 0.1 % NA SOLN: 2.0000 | Freq: Two times a day (BID) | NASAL | Status: DC

## 2012-05-26 NOTE — Patient Instructions (Addendum)
Today we updated your med list in our EPIC system...    Continue your current medications the same...  We refilled the meds you requested...  Call for any problems or if we can be of service in any way.Marland KitchenMarland Kitchen

## 2012-05-26 NOTE — Progress Notes (Signed)
Subjective:    Patient ID: Derrick Rivera, male    DOB: 06-02-1924, 78 y.o.   MRN: [id number]  HPI 78 y/o WM here for a follow up visit... he has multiple medical problems as noted below...   ~  he is followed regularly by [facility] for SEHV> his notes & blood work are reviewed> Hx CAD- s/p rotational atherectomy & stent... DrKelly was to do a sleep study for OSA symptoms- pt declined this test due to the cost involved... he also planned CDopplers- we don't have these results either but pt tells me they were "OK"... Prev we tried to switch him to Simvastatin from VYTORIN to save him $$$, but he indicates that [facility] wants him on the Vytorin instead...  ~  he is followed by DrSherrill for Oncology w/ hx of non-hodgkins lymphoma dx in 1996, his notes are reviewed as well> chemotherapy finished in 2004 & he has a chronic abdominal/ mesenteric mass, and chronic mild thrombocytopenia w/ Plat count ~ 100K...  ~  Apr 02, 2011:  I last saw Derrick Rivera 7/09 for a yearly check up at that time w/ complaints of sinus drainage treated w/ antihist, Flonase & Astepro at that time... As noted- he is well cared for by [facility] & DrSherrill and they do all his labs etc...  Today he has a few specific requests> wants Rx for NASACORT AQ;  Wants a follow up CXR;  And he's complaining of ~59mo hx of incr reflux symptoms (known HH on prev EGD see below)> & we discussed PPI therapy...  We also reviewed his labs done by [facility] 11/11, and he wants [place] to give him stool cards to chewck for hidden blood...  ~  May 26, 2012:  78mo ROV & Derrick Rivera had a bout of [place] 5/13 involving the left T9 distrib, treated by TP w/ Valtrex & Pred & resolved swiftly, no residual problem, we discussed Shingles vaccine & he will check w/ his insurance...  He also reports Moh's surg for recurrent skin cancer on nose & subseq mult plastic procedures at [facility], DrMarks...     He saw Howell Rucks [facility] 6/12 (note reviewed) & recently 6/13 (note pending)> known total  occlusion of RCA since 1995, & extensive dis in LAD system w/ complex intervention & stents in 2003, he has done well since then; known mild LVH, norm sys function but mild infer wall HK, AoV sclerosis & mild pulmHTN;  Myoview 6/12 w/ infer scar, no ischemia, EF=58%... Carotid duplex testing ~6/12 reported to show mod heterogen plaque w/ 50-69% right ICA stenosis, no change serially    He saw DrSherrill 3/13 for f/u non-hodgkin's lymphoma diagnosed 1996, off therapy since 2000, new right neck lymph node 9/10 & restaging CT showed stable mesenteric mass & no evid of progressive lymphoma; also followed for mild chronic thrombocytopenia, no change...  We reviewed prob list, meds, xrays and labs> see below>> He tells me that [facility] did full labs recently & we have requested copies for EPIC...          Problem List:  OPHTHALMOLOGIC PROBLEM>  He was referred by DrStoneburner to DrMartin [facility]> prob retinal vascular occlusion (?noted blurred vision after cat surg)... ~  CDopplers done 5/11 showed mild plaque w/ 50-69% right ICA stenosis  ALLERGIC RHINITIS (ICD-477.9) - c/o nasal drainage- we discussed strategies for control including:  OTC Antihist prn,  Saline mist every 1-2H during the day,  FLONASE 2 sp in each nostril Qhs...  HYPERTENSION (ICD-401.9) - on ATENOLOL 50mg -  taking 1/2 Bid,  LISINOPRIL 10mg /d,  & CALAN 240mg /d...  ~  7/09:  BP = 126/70 and denies HA, fatigue, visual changes, CP, palipit, dizziness, syncope, dyspnea, edema, etc... ~  1/11:  Labs from [place] w/ normal renal function etc... ~  5/12:  BP= 126/80 & he remains asymptomatic;  CXR today shows left shoulder arthroplasty, advanced arthritic changes in right shoulder, sl eventration right hemidiaph, normal heart size & clear lungs... ~  6/13:  BP= 120/62 & he remains asymptomatic by his hx...  ATHEROSCLEROTIC HEART DISEASE (ICD-414.00) - on ASA 81mg Bid,  & IMDUR 60mg /d...  ~  1995:  Found to have a total RCA occlusion w/  collaterals... ~  2003:  He had a complex intervention w/ rotatioal atherectomy of his entire LAD system at mult sites w/ 2 stents... ~  baseline EKG shows NSR, sm infer Q's, otherw NAD... ~  2DEcho 9/06 showed mild asymmetric LVH, septal thickening, mild infer HK, mild TR & mild plm HTN w/ RVsys est ~40 ~  NuclearStressTest 4/08 showed sm defect inferiorly from his known RCA occlusion, normal perfusion anteriorly and laterally where prev LAD rotational atherectomy and stent were done... ~  Repeat nuclear study 12/10 was the same> old scar, no ischemia, & EF= 58% ~  He continues to f/u w/ Howell Rucks every 77mo and his notes are reviewed...  PERIPHERAL VASCULAR DISEASE (ICD-443.9) >> known right carotid disease followed by [facility] w/ Doppler Ultrasound exams... ~  Carotid duplex testing ~6/12 reported to show mod heterogen plaque w/ 50-69% right ICA stenosis, no change serially  HYPERCHOLESTEROLEMIA (ICD-272.0) - DrKelly switched his Vytorin to CRESTOR 10mg /d, and he follows the labs. ~  FLP 3/09 from [facility] showed TChol 144, TG 122, HDL 58, LDL 63... ~  FLP 11/11 from [facility] showed TChol 141, TG 125, HDL 55, LDL 61 ~  He continues to have labs monitored by [facility]...  GERD (ICD-530.81) - last EGD 3/07 by DrSam showed HH, ?stricture- dilated... ~  5/12:  Pt c/o incr GERD symptoms but was only using the Prilosec prn;  Given Nexium samples for 3weeks, then switch back to PRILOSEC 20mg /d...  DIVERTICULOSIS OF COLON (ICD-562.10) & COLONIC POLYPS (ICD-211.3) - last colonoscopy 3/07 showed divertics, hems, and 4mm polyp = tubular adenoma... f/u planned 2yrs...  DEGENERATIVE JOINT DISEASE (ICD-715.90) - he is s/p left total knee replacement surgery (DrCarter 4/03) and left shoulder arthroplasty (DrNorris 5/06) as well... ~  8/10:  He had full CSpine eval by DrBrooks at [facility] w/ multilevel DDD found...  NON-HODGKIN'S LYMPHOMA (ICD-202.80) & THROMBOCYTOPENIA (ICD-287.5) - Eval and Rx by DrSherrill>  on observation since 2004 & stable... ~  last CT Abd 5/08 w/ irreg calcified mesenteric lesion c/w treated lymphoma; calcif in AbdAo, divertics... ~  No change in this abn on follow up scan 1/10 by oncology... ~  He continues to f/u w/ DrSherrill once or twice yearly...  Health Maintenance -  full routine labs from [facility] 3/09 showed PSA= 0.94... ~  Hx skin cancer removed right side of nose via Moh's & required plastic surg from [facility] [place]...   Past Surgical History  Procedure Date  . Total knee arthroplasty 03-2002    Dr. Montez Morita  . Total shoulder arthroplasty 03-2005    Dr. Ranell Patrick    Outpatient Encounter Prescriptions as of 05/26/2012  Medication Sig Dispense Refill  . Ascorbic Acid (VITAMIN C) 500 MG tablet Take 500 mg by mouth daily.        Marland Kitchen aspirin 81 MG  tablet Take 2 tablets by mouth daily      . atenolol (TENORMIN) 50 MG tablet Take 1/2 tablet by mouth two times daily      . azelastine (ASTELIN) 137 MCG/SPRAY nasal spray 2 sprays by Nasal route 2 (two) times daily. Use in each nostril as directed       . B Complex-C-Calcium (B-COMPLEX/VITAMIN C) TABS Take one tablet by mouth every 3 days       . fish oil-omega-3 fatty acids 1000 MG capsule Take one capsule by mouth every 3 days       . fluticasone (FLONASE) 50 MCG/ACT nasal spray 2 sprays by Nasal route daily.        . isosorbide mononitrate (IMDUR) 60 MG 24 hr tablet Take 60 mg by mouth daily.        Marland Kitchen lisinopril (PRINIVIL,ZESTRIL) 10 MG tablet Take 10 mg by mouth daily.        . Multiple Vitamins-Minerals (MULTIVITAMIN WITH MINERALS) tablet Take one tablet by mouth every 3 days      . omeprazole (PRILOSEC) 20 MG capsule Take one tablet by mouth 30 minutes prior to dinner  30 capsule  1  . rosuvastatin (CRESTOR) 10 MG tablet Take 10 mg by mouth daily.        . verapamil (CALAN-SR) 240 MG CR tablet Take 240 mg by mouth at bedtime.        . triamcinolone (NASACORT) 55 MCG/ACT nasal inhaler 2 sprays by Nasal route at bedtime.  1  Inhaler  11  . DISCONTD: predniSONE (DELTASONE) 10 MG tablet 4 tabs for 2 days, then 3 tabs for 2 days, 2 tabs for 2 days, then 1 tab for 2 days, then stop  20 tablet  0    Allergies  Allergen Reactions  . Penicillins     Did not help with strep throat    Current Medications, Allergies, Past Medical History, Past Surgical History, Family History, and Social History were reviewed in [employer] record.   Review of Systems    See HPI - all other systems neg except as noted... The patient denies anorexia, fever, weight loss, weight gain, vision loss, decreased hearing, hoarseness, chest pain, syncope, dyspnea on exertion, peripheral edema, prolonged cough, headaches, hemoptysis, abdominal pain, melena, hematochezia, severe indigestion/heartburn, hematuria, incontinence, muscle weakness, suspicious skin lesions, transient blindness, difficulty walking, depression, unusual weight change, abnormal bleeding, enlarged lymph nodes, and angioedema.     Objective:   Physical Exam    WD, WN, 78 y/o WM in NAD... GENERAL:  Alert & oriented; pleasant & cooperative... HEENT:  [place]/AT, EOM-wnl, PERRLA, EACs-clear, TMs-wnl, NOSE-clear, THROAT-clear & wnl. NECK:  Supple w/ fairROM; no JVD; normal carotid impulses w/o bruits; no thyromegaly or nodules palpated; no lymphadenopathy. CHEST:  Clear to P & A; without wheezes/ rales/ or rhonchi heard...  HEART:  Regular Rhythm;  faint gr1/6 SEM, without rubs or gallops heard... ABDOMEN:  Soft & nontender; normal bowel sounds; no organomegaly, vague fullness mid-abd above umbilicus/ non-tender... EXT: without deformities, mod arthritic changes; no varicose veins/ +venous insuffic/ no edema. NEURO:  CN's intact;  no focal neuro deficits... DERM:  No lesions noted; no rash etc...  RADIOLOGY DATA:  Reviewed in the EPIC EMR & discussed w/ the patient...  LABORATORY DATA:  Reviewed in the EPIC EMR & discussed w/ the patient...   Assessment  & Plan:   AR>  He requests NASACORT AQ- vs FLONASE for better insurance coverage......  HBP>  Controlled  on meds, continue same...  ASHD>  Well tended by [facility] & stable on the ASA, & IMDUR w/o angina, etc...  ASPVD>  On ASA bid & DrKelly following CDopplers etc... Retinal vasc occlusion, but everything is stable now...  CHOL>  FLP looks good on the [place]...  GI>  He is c/o incr GERD & we discussed Nexium samples x 3wks the switch to [facility] OTC dailt & stay on this...  LYMPHOMA>  Followed by DrSherrill & stable as noted...   Patient's Medications  New Prescriptions   No medications on file  Previous Medications   ASCORBIC ACID (VITAMIN C) 500 MG TABLET    Take 500 mg by mouth daily.     ASPIRIN 81 MG TABLET    Take 2 tablets by mouth daily   ATENOLOL (TENORMIN) 50 MG TABLET    Take 1/2 tablet by mouth two times daily   B COMPLEX-C-CALCIUM (B-COMPLEX/VITAMIN C) TABS    Take one tablet by mouth every 3 days    FISH OIL-OMEGA-3 FATTY ACIDS 1000 MG CAPSULE    Take one capsule by mouth every 3 days    ISOSORBIDE MONONITRATE (IMDUR) 60 MG 24 HR TABLET    Take 60 mg by mouth daily.     LISINOPRIL (PRINIVIL,ZESTRIL) 10 MG TABLET    Take 10 mg by mouth daily.     MULTIPLE VITAMINS-MINERALS (MULTIVITAMIN WITH MINERALS) TABLET    Take one tablet by mouth every 3 days   ROSUVASTATIN (CRESTOR) 10 MG TABLET    Take 10 mg by mouth daily.     VERAPAMIL (CALAN-SR) 240 MG CR TABLET    Take 240 mg by mouth at bedtime.    Modified Medications   Modified Medication Previous Medication   AZELASTINE (ASTELIN) 137 MCG/SPRAY NASAL SPRAY azelastine (ASTELIN) 137 MCG/SPRAY nasal spray      Place 2 sprays into the nose 2 (two) times daily. Use in each nostril as directed    2 sprays by Nasal route 2 (two) times daily. Use in each nostril as directed    FLUTICASONE (FLONASE) 50 MCG/ACT NASAL SPRAY fluticasone (FLONASE) 50 MCG/ACT nasal spray      Place 2 sprays into the nose daily.    2 sprays by Nasal  route daily.     OMEPRAZOLE (PRILOSEC) 20 MG CAPSULE omeprazole (PRILOSEC) 20 MG capsule      Take one tablet by mouth 30 minutes prior to dinner    Take one tablet by mouth 30 minutes prior to dinner  Discontinued Medications   PREDNISONE (DELTASONE) 10 MG TABLET    4 tabs for 2 days, then 3 tabs for 2 days, 2 tabs for 2 days, then 1 tab for 2 days, then stop   TRIAMCINOLONE (NASACORT) 55 MCG/ACT NASAL INHALER    2 sprays by Nasal route at bedtime.

## 2012-08-24 ENCOUNTER — Telehealth: Payer: Self-pay | Admitting: *Deleted

## 2012-08-24 NOTE — Telephone Encounter (Signed)
Calling to report diarrhea X 3 days. Asking if Dr. Truett Perna needs to see him or should he see PCP? Per Dr. Daphane Shepherd Dr. Kriste Basque access.

## 2012-08-26 ENCOUNTER — Telehealth: Payer: Self-pay | Admitting: Pulmonary Disease

## 2012-08-26 NOTE — Telephone Encounter (Signed)
Pt called back.  Pt stated that he has tried imodium & that hasn't worked.  Pt would like to know if he is going to be worked in for an appt today or what [employer] are.  Please advise.  Antionette Fairy

## 2012-08-26 NOTE — Telephone Encounter (Signed)
Spoke with pt. He is c/o diarrhea off and on x 5 days. Has had occ mild belly pain and gas. Taking kaopectate and immodium with little relief. Denies f/c/s, nausea and vomiting. He is already increasing fluids and sticking with bland diet. Needs further recs from SN. Last ov 05/26/12. None pending. Please advise, thanks! Allergies  Allergen Reactions  . Penicillins     Did not help with strep throat

## 2012-08-26 NOTE — Telephone Encounter (Signed)
Per SN----recs are to cont the immodium 1-2 every 4 hours, add algin once daily, add activia yogurt daily and we can add pt on for tomorrow if he wants to be seen at 8:45 in the morning.  thanks

## 2012-08-26 NOTE — Telephone Encounter (Signed)
Spoke with pt and notified of recs per SN.  Pt verbalized understanding and we have scheduled his ov for tomorrow am at 8:45 He states if doing well will call back and cancel

## 2012-08-27 ENCOUNTER — Encounter: Payer: Self-pay | Admitting: Pulmonary Disease

## 2012-08-27 ENCOUNTER — Ambulatory Visit (INDEPENDENT_AMBULATORY_CARE_PROVIDER_SITE_OTHER): Payer: PRIVATE HEALTH INSURANCE | Admitting: Pulmonary Disease

## 2012-08-27 VITALS — BP 112/70 | HR 64 | Temp 97.1°F | Ht 69.0 in | Wt 173.4 lb

## 2012-08-27 DIAGNOSIS — I2510 Atherosclerotic heart disease of native coronary artery without angina pectoris: Secondary | ICD-10-CM

## 2012-08-27 DIAGNOSIS — R197 Diarrhea, unspecified: Secondary | ICD-10-CM

## 2012-08-27 DIAGNOSIS — K529 Noninfective gastroenteritis and colitis, unspecified: Secondary | ICD-10-CM

## 2012-08-27 DIAGNOSIS — K219 Gastro-esophageal reflux disease without esophagitis: Secondary | ICD-10-CM

## 2012-08-27 DIAGNOSIS — I10 Essential (primary) hypertension: Secondary | ICD-10-CM

## 2012-08-27 DIAGNOSIS — E7800 Pure hypercholesterolemia, unspecified: Secondary | ICD-10-CM

## 2012-08-27 DIAGNOSIS — K5289 Other specified noninfective gastroenteritis and colitis: Secondary | ICD-10-CM

## 2012-08-27 DIAGNOSIS — I739 Peripheral vascular disease, unspecified: Secondary | ICD-10-CM

## 2012-08-27 DIAGNOSIS — Z23 Encounter for immunization: Secondary | ICD-10-CM

## 2012-08-27 DIAGNOSIS — D126 Benign neoplasm of colon, unspecified: Secondary | ICD-10-CM

## 2012-08-27 DIAGNOSIS — K5730 Diverticulosis of large intestine without perforation or abscess without bleeding: Secondary | ICD-10-CM

## 2012-08-27 DIAGNOSIS — C8589 Other specified types of non-Hodgkin lymphoma, extranodal and solid organ sites: Secondary | ICD-10-CM

## 2012-08-27 MED ORDER — METRONIDAZOLE 250 MG PO TABS: 250.0000 mg | ORAL_TABLET | Freq: Four times a day (QID) | ORAL | Status: DC

## 2012-08-27 NOTE — Progress Notes (Signed)
Subjective:    Patient ID: Derrick Rivera, male    DOB: 06/28/1924, 78 y.o.   MRN: [id number]  HPI 78 y/o WM here for a follow up visit... he has multiple medical problems as noted below...   ~  he is followed regularly by [facility] for SEHV> his notes & blood work are reviewed> Hx CAD- s/p rotational atherectomy & stent... DrKelly was to do a sleep study for OSA symptoms- pt declined this test due to the cost involved... he also planned CDopplers- we don't have these results either but pt tells me they were "OK"... Prev we tried to switch him to Simvastatin from VYTORIN to save him $$$, but he indicates that [facility] wants him on the Vytorin instead...  ~  he is followed by DrSherrill for Oncology w/ hx of non-hodgkins lymphoma dx in 1996, his notes are reviewed as well> chemotherapy finished in 2004 & he has a chronic abdominal/ mesenteric mass, and chronic mild thrombocytopenia w/ Plat count ~ 100K...  ~  Apr 02, 2011:  I last saw [name] 7/09 for a yearly check up at that time w/ complaints of sinus drainage treated w/ antihist, Flonase & Astepro at that time... As noted- he is well cared for by [facility] & DrSherrill and they do all his labs etc...  Today he has a few specific requests> wants Rx for NASACORT AQ;  Wants a follow up CXR;  And he's complaining of ~38mo hx of incr reflux symptoms (known HH on prev EGD see below)> & we discussed PPI therapy...  We also reviewed his labs done by [facility] 11/11, and he wants [place] to give him stool cards to chewck for hidden blood...  ~  May 26, 2012:  18mo ROV & [place] had a bout of [place] 5/13 involving the left T9 distrib, treated by TP w/ Valtrex & Pred & resolved swiftly, no residual problem, we discussed Shingles vaccine & he will check w/ his insurance...  He also reports Moh's surg for recurrent skin cancer on nose & subseq mult plastic procedures at [facility], DrMarks...     He saw Howell Rucks [facility] 6/12 (note reviewed) & recently 6/13 (note pending)> known total  occlusion of RCA since 1995, & extensive dis in LAD system w/ complex intervention & stents in 2003, he has done well since then; known mild LVH, norm sys function but mild infer wall HK, AoV sclerosis & mild pulmHTN;  Myoview 6/12 w/ infer scar, no ischemia, EF=58%... Carotid duplex testing ~6/12 reported to show mod heterogen plaque w/ 50-69% right ICA stenosis, no change serially    He saw DrSherrill 3/13 for f/u non-hodgkin's lymphoma diagnosed 1996, off therapy since 2000, new right neck lymph node 9/10 & restaging CT showed stable mesenteric mass & no evid of progressive lymphoma; also followed for mild chronic thrombocytopenia, no change...  We reviewed prob list, meds, xrays and labs> see below>> He tells me that [facility] did full labs recently & we have requested copies for EPIC...  ~  August 27, 2012:  7mo ROV & add-on for diarrhea> pt notes that he went to his wife's 65th high school reunion in [place]; the next day he developed watery diarrhea (some leakage/ soiling), mild abd cramping, & gas;  He denies severe localized pain, n/v, f/c/s, etc;  He was started on Align, Activia, & Immodium w/ sl improvement;  We discussed poss "food poisoning" & rec Flagyl 250mg Qid + his other Rx...     HBP controlled on ;  ASHD  followed by [facility] on Imdur & stable;  Chol controlled on Cres10 & FLP followed by Howell Rucks...    We reviewed prob list, meds, xrays and labs> see below for updates >> OK for flu vaccine today... LABS 5/13 from [facility]:  FLP- ok x TG=169;  Chems- wnl;  CBC- wnl;  TSH=1.70           Problem List:  OPHTHALMOLOGIC PROBLEM>  He was referred by DrStoneburner to DrMartin [facility]> prob retinal vascular occlusion (?noted blurred vision after cat surg)... ~  CDopplers done 5/11 showed mild plaque w/ 50-69% right ICA stenosis  ALLERGIC RHINITIS (ICD-477.9) - c/o nasal drainage- we discussed strategies for control including:  OTC Antihist prn,  Saline mist every 1-2H during the day,   FLONASE 2 sp in each nostril Qhs...  HYPERTENSION (ICD-401.9) - on ATENOLOL 50mg - taking 1/2 Bid,  LISINOPRIL 10mg /d,  & CALAN 240mg /d...  ~  7/09:  BP = 126/70 and denies HA, fatigue, visual changes, CP, palipit, dizziness, syncope, dyspnea, edema, etc... ~  1/11:  Labs from [place] w/ normal renal function etc... ~  5/12:  BP= 126/80 & he remains asymptomatic;  CXR today shows left shoulder arthroplasty, advanced arthritic changes in right shoulder, sl eventration right hemidiaph, normal heart size & clear lungs... ~  6/13:  BP= 120/62 & he remains asymptomatic by his hx... ~  9/13:  BP= 112/70 & he denies CP, palpit, SOB, edema, etc...  ATHEROSCLEROTIC HEART DISEASE (ICD-414.00) - on ASA 81mg Bid,  & IMDUR 60mg /d...  ~  1995:  Found to have a total RCA occlusion w/ collaterals... ~  2003:  He had a complex intervention w/ rotatioal atherectomy of his entire LAD system at mult sites w/ 2 stents... ~  baseline EKG shows NSR, sm infer Q's, otherw NAD... ~  2DEcho 9/06 showed mild asymmetric LVH, septal thickening, mild infer HK, mild TR & mild plm HTN w/ RVsys est ~40 ~  NuclearStressTest 4/08 showed sm defect inferiorly from his known RCA occlusion, normal perfusion anteriorly and laterally where prev LAD rotational atherectomy and stent were done... ~  Repeat nuclear study 12/10 was the same> old scar, no ischemia, & EF= 58% ~  Nuclear stress Test 6/12 showed mod perfusion defect & perinfarct ischemia in inferior regions; EF= 58%; no change from prev... ~  He continues to f/u w/ Howell Rucks every 57mo and his notes are reviewed...  PERIPHERAL VASCULAR DISEASE (ICD-443.9) >> known right carotid disease followed by [facility] w/ Doppler Ultrasound exams... ~  Carotid duplex testing ~6/12 reported to show mod heterogen plaque w/ 50-69% right ICA stenosis, no change serially.  HYPERCHOLESTEROLEMIA (ICD-272.0) - DrKelly switched his Vytorin to CRESTOR 10mg /d, and he follows the labs. ~  FLP 3/09 from  [facility] showed TChol 144, TG 122, HDL 58, LDL 63... ~  FLP 11/11 from [facility] showed TChol 141, TG 125, HDL 55, LDL 61 ~  FLP 5/13 from [facility] on Cres10 showed TChol 150, TG 169, HDL 47, LDL 69 ~  He continues to have labs monitored by [facility]...  GERD (ICD-530.81) - last EGD 3/07 by DrSam showed HH, ?stricture- dilated... ~  5/12:  Pt c/o incr GERD symptoms but was only using the Prilosec prn;  Given Nexium samples for 3weeks, then switch back to PRILOSEC 20mg /d...  DIVERTICULOSIS OF COLON (ICD-562.10) & COLONIC POLYPS (ICD-211.3) - last colonoscopy 3/07 showed divertics, hems, and 4mm polyp = tubular adenoma... f/u planned 50yrs...  DEGENERATIVE JOINT DISEASE (ICD-715.90) - he is s/p left total knee  replacement surgery (DrCarter 4/03) and left shoulder arthroplasty (DrNorris 5/06) as well... ~  8/10:  He had full CSpine eval by DrBrooks at [facility] w/ multilevel DDD found...  NON-HODGKIN'S LYMPHOMA (ICD-202.80) & THROMBOCYTOPENIA (ICD-287.5) - Eval and Rx by DrSherrill> on observation since 2004 & stable... ~  last CT Abd 5/08 w/ irreg calcified mesenteric lesion c/w treated lymphoma; calcif in AbdAo, divertics... ~  No change in this abn on follow up CT Chest/ Abd/ Pelvis 1/10 by oncology> ?incr adipose tissue in left breast area, atherosclerotic calcif in vessels, severe right shoulder arthritis; calcif mesenteric masses c/w treated lymphoma, renal cyst, lumbar spondy, etc... ~  He continues to f/u w/ DrSherrill once or twice yearly...  Health Maintenance -  full routine labs from [facility] 3/09 showed PSA= 0.94... ~  Hx skin cancer removed right side of nose via Moh's & required plastic surg from [place], [place]...   Past Surgical History  Procedure Date  . Total knee arthroplasty 03-2002    Dr. Montez Morita  . Total shoulder arthroplasty 03-2005    Dr. Ranell Patrick    Outpatient Encounter Prescriptions as of 08/27/2012  Medication Sig Dispense Refill  . Ascorbic Acid (VITAMIN C) 500 MG tablet  Take 500 mg by mouth daily.        Marland Kitchen aspirin 81 MG tablet Take 2 tablets by mouth daily      . atenolol (TENORMIN) 50 MG tablet Take 1/2 tablet by mouth two times daily      . azelastine (ASTELIN) 137 MCG/SPRAY nasal spray Place 2 sprays into the nose 2 (two) times daily. Use in each nostril as directed  90 mL  3  . B Complex-C-Calcium (B-COMPLEX/VITAMIN C) TABS Take one tablet by mouth every 3 days       . fish oil-omega-3 fatty acids 1000 MG capsule Take one capsule by mouth daily      . fluticasone (FLONASE) 50 MCG/ACT nasal spray Place 2 sprays into the nose daily.  48 g  3  . isosorbide mononitrate (IMDUR) 60 MG 24 hr tablet Take 60 mg by mouth daily.        Marland Kitchen lisinopril (PRINIVIL,ZESTRIL) 10 MG tablet Take 10 mg by mouth daily.        . Multiple Vitamins-Minerals (MULTIVITAMIN WITH MINERALS) tablet Take one tablet by mouth every 3 days      . omeprazole (PRILOSEC) 20 MG capsule Take one tablet by mouth 30 minutes prior to dinner  90 capsule  3  . rosuvastatin (CRESTOR) 10 MG tablet Take 10 mg by mouth daily.        . verapamil (CALAN-SR) 240 MG CR tablet Take 240 mg by mouth at bedtime.          Allergies  Allergen Reactions  . Penicillins     Did not help with strep throat    Current Medications, Allergies, Past Medical History, Past Surgical History, Family History, and Social History were reviewed in [employer] record.   Review of Systems    See HPI - all other systems neg except as noted... The patient denies anorexia, fever, weight loss, weight gain, vision loss, decreased hearing, hoarseness, chest pain, syncope, dyspnea on exertion, peripheral edema, prolonged cough, headaches, hemoptysis, abdominal pain, melena, hematochezia, severe indigestion/heartburn, hematuria, incontinence, muscle weakness, suspicious skin lesions, transient blindness, difficulty walking, depression, unusual weight change, abnormal bleeding, enlarged lymph nodes, and angioedema.      Objective:   Physical Exam    WD, WN, 78  y/o WM in NAD... GENERAL:  Alert & oriented; pleasant & cooperative... HEENT:  [place]/AT, EOM-wnl, PERRLA, EACs-clear, TMs-wnl, NOSE-clear, THROAT-clear & wnl. NECK:  Supple w/ fairROM; no JVD; normal carotid impulses w/o bruits; no thyromegaly or nodules palpated; no lymphadenopathy. CHEST:  Clear to P & A; without wheezes/ rales/ or rhonchi heard...  HEART:  Regular Rhythm;  faint gr1/6 SEM, without rubs or gallops heard... ABDOMEN:  Soft & nontender; normal bowel sounds; no organomegaly, vague fullness mid-abd above umbilicus/ non-tender... EXT: without deformities, mod arthritic changes; no varicose veins/ +venous insuffic/ no edema. NEURO:  CN's intact;  no focal neuro deficits... DERM:  No lesions noted; no rash etc...  RADIOLOGY DATA:  Reviewed in the EPIC EMR & discussed w/ the patient...  LABORATORY DATA:  Reviewed in the EPIC EMR & discussed w/ the patient...   Assessment & Plan:   DIARRHEA>  Acute onset after attending a HS reunion in [place], no knowledge of others ill; discussed Rx w/ Flagyl, Immodium, Align, Activia, etc...   AR>  He requests NASACORT AQ- vs FLONASE for better insurance coverage......  HBP>  Controlled on meds, continue same...  ASHD>  Well tended by [facility] & stable on the ASA, & IMDUR w/o angina, etc...  ASPVD>  On ASA bid & DrKelly following CDopplers etc... Retinal vasc occlusion, but everything is stable now...  CHOL>  FLP looks good on the Cres10...  GI>  He is c/o incr GERD & we discussed Nexium samples x 3wks the switch to [facility] OTC dailt & stay on this...  LYMPHOMA>  Followed by DrSherrill & stable as noted...   Patient's Medications  New Prescriptions   METRONIDAZOLE (FLAGYL) 250 MG TABLET    Take 1 tablet (250 mg total) by mouth 4 (four) times daily.  Previous Medications   ASCORBIC ACID (VITAMIN C) 500 MG TABLET    Take 500 mg by mouth daily.     ASPIRIN 81 MG TABLET    Take 2  tablets by mouth daily   ATENOLOL (TENORMIN) 50 MG TABLET    Take 1/2 tablet by mouth two times daily   AZELASTINE (ASTELIN) 137 MCG/SPRAY NASAL SPRAY    Place 2 sprays into the nose 2 (two) times daily. Use in each nostril as directed   B COMPLEX-C-CALCIUM (B-COMPLEX/VITAMIN C) TABS    Take one tablet by mouth every 3 days    FISH OIL-OMEGA-3 FATTY ACIDS 1000 MG CAPSULE    Take one capsule by mouth daily   FLUTICASONE (FLONASE) 50 MCG/ACT NASAL SPRAY    Place 2 sprays into the nose daily.   ISOSORBIDE MONONITRATE (IMDUR) 60 MG 24 HR TABLET    Take 60 mg by mouth daily.     LISINOPRIL (PRINIVIL,ZESTRIL) 10 MG TABLET    Take 10 mg by mouth daily.     MULTIPLE VITAMINS-MINERALS (MULTIVITAMIN WITH MINERALS) TABLET    Take one tablet by mouth every 3 days   OMEPRAZOLE (PRILOSEC) 20 MG CAPSULE    Take one tablet by mouth 30 minutes prior to dinner   ROSUVASTATIN (CRESTOR) 10 MG TABLET    Take 10 mg by mouth daily.     VERAPAMIL (CALAN-SR) 240 MG CR TABLET    Take 240 mg by mouth at bedtime.    Modified Medications   No medications on file  Discontinued Medications   No medications on file

## 2012-08-27 NOTE — Patient Instructions (Addendum)
Today we updated your med list in our EPIC system...    Continue your current medications the same...  For your gastroenteritis & diarrhea:    Take the Flagyl (Metronidazole) 250mg  4 times daily...    Continue the Immodium every 4-6H as needed for watery diarrhea...    Continue the [employer] daily...  If the symptoms persist we will refer you to the gastroenterologist for further evaluation & colonoscopy...  Call for any questions...  We gave you the 2013 flu vaccine today.Marland KitchenMarland Kitchen

## 2012-11-02 ENCOUNTER — Other Ambulatory Visit (HOSPITAL_BASED_OUTPATIENT_CLINIC_OR_DEPARTMENT_OTHER): Payer: PRIVATE HEALTH INSURANCE

## 2012-11-02 ENCOUNTER — Telehealth: Payer: Self-pay | Admitting: Oncology

## 2012-11-02 ENCOUNTER — Ambulatory Visit (HOSPITAL_BASED_OUTPATIENT_CLINIC_OR_DEPARTMENT_OTHER): Payer: PRIVATE HEALTH INSURANCE | Admitting: Oncology

## 2012-11-02 VITALS — BP 127/76 | HR 58 | Temp 96.7°F | Resp 20 | Ht 69.0 in | Wt 176.4 lb

## 2012-11-02 DIAGNOSIS — D696 Thrombocytopenia, unspecified: Secondary | ICD-10-CM

## 2012-11-02 DIAGNOSIS — C8589 Other specified types of non-Hodgkin lymphoma, extranodal and solid organ sites: Secondary | ICD-10-CM

## 2012-11-02 LAB — CBC WITH DIFFERENTIAL/PLATELET
BASO%: 0.6 % (ref 0.0–2.0)
EOS%: 3.3 % (ref 0.0–7.0)
HCT: 42.6 % (ref 38.4–49.9)
LYMPH%: 24 % (ref 14.0–49.0)
MCH: 33 pg (ref 27.2–33.4)
MCHC: 35.2 g/dL (ref 32.0–36.0)
MONO#: 0.7 10*3/uL (ref 0.1–0.9)
NEUT%: 61.6 % (ref 39.0–75.0)
Platelets: 115 10*3/uL — ABNORMAL LOW (ref 140–400)
RBC: 4.54 10*6/uL (ref 4.20–5.82)
WBC: 7.1 10*3/uL (ref 4.0–10.3)
lymph#: 1.7 10*3/uL (ref 0.9–3.3)
nRBC: 0 % (ref 0–0)

## 2012-11-02 NOTE — Telephone Encounter (Signed)
gv and printed appt schedule to pt for Dec 2014

## 2012-11-02 NOTE — Progress Notes (Signed)
   [facility]    OFFICE PROGRESS NOTE   INTERVAL HISTORY:   He returns as scheduled. No new complaint. No fever, night sweats, or palpable lymph nodes. No abdominal pain. He does not notice the abdominal mass.  Objective:  Vital signs in last 24 hours:  Blood pressure 127/76, pulse 58, temperature 96.7 F (35.9 C), temperature source Oral, resp. rate 20, height 5\' 9"  (1.753 m), weight 176 lb 6.4 oz (80.015 kg).    HEENT: Slight prominence of the right submandibular gland. Oropharynx without visible mass. Neck without mass. Lymphatics: No cervical, supraclavicular, axillary, or inguinal nodes Resp: Lungs clear bilaterally Cardio: Regular rate and rhythm GI: No hepatosplenomegaly, there is a mobile mass in the mid abdomen superior to the umbilicus. No other mass. Vascular: No leg edema   Lab Results:  Lab Results  Component Value Date   WBC 7.1 11/02/2012   HGB 15.0 11/02/2012   HCT 42.6 11/02/2012   MCV 93.8 11/02/2012   PLT 115* 11/02/2012   ANC 4.4    Medications: I have reviewed the patient's current medications.  Assessment/Plan: 1. Non-Hodgkin lymphoma diagnosed in 1996. He has been maintained off of specific therapy since 2000. a. New lymph nodes in the right neck on exam 08/23/2009. b. Restaging CT evaluation 12/21/2008 revealed a stable mesenteric mass and no evidence for progression of lymphoma. 2. Chronic mild thrombocytopenia. 3. "Mohs" surgery for treatment of a skin cancer at the right side of the nose.                                                                                                           a. He has undergone surgery for a local recurrence of the basal cell carcinoma with repeat reconstruction procedures 4. Chronic neck pain.   Disposition:  He remains in clinical remission from the non-Hodgkin's lymphoma. Mr. Knutzen will return for an office visit in one year. He will contact us in the interim for new  symptoms.   Thornton Papas, MD  11/02/2012  1:19 PM

## 2013-01-20 ENCOUNTER — Encounter: Payer: Self-pay | Admitting: *Deleted

## 2013-01-20 DIAGNOSIS — G4730 Sleep apnea, unspecified: Secondary | ICD-10-CM | POA: Insufficient documentation

## 2013-04-28 ENCOUNTER — Encounter (HOSPITAL_COMMUNITY): Payer: Self-pay | Admitting: Cardiovascular Disease

## 2013-04-29 ENCOUNTER — Other Ambulatory Visit (HOSPITAL_COMMUNITY): Payer: Self-pay | Admitting: Cardiovascular Disease

## 2013-04-29 DIAGNOSIS — E785 Hyperlipidemia, unspecified: Secondary | ICD-10-CM

## 2013-04-29 DIAGNOSIS — I2510 Atherosclerotic heart disease of native coronary artery without angina pectoris: Secondary | ICD-10-CM

## 2013-07-06 ENCOUNTER — Ambulatory Visit (HOSPITAL_COMMUNITY)
Admission: RE | Admit: 2013-07-06 | Discharge: 2013-07-06 | Disposition: A | Discharge status: Home / Self Care | Payer: PRIVATE HEALTH INSURANCE | Source: Ambulatory Visit | Attending: Cardiovascular Disease | Admitting: Cardiovascular Disease

## 2013-07-06 DIAGNOSIS — R0609 Other forms of dyspnea: Secondary | ICD-10-CM | POA: Insufficient documentation

## 2013-07-06 DIAGNOSIS — R42 Dizziness and giddiness: Secondary | ICD-10-CM | POA: Insufficient documentation

## 2013-07-06 DIAGNOSIS — E663 Overweight: Secondary | ICD-10-CM | POA: Insufficient documentation

## 2013-07-06 DIAGNOSIS — Z9861 Coronary angioplasty status: Secondary | ICD-10-CM | POA: Insufficient documentation

## 2013-07-06 DIAGNOSIS — I739 Peripheral vascular disease, unspecified: Secondary | ICD-10-CM | POA: Insufficient documentation

## 2013-07-06 DIAGNOSIS — R0989 Other specified symptoms and signs involving the circulatory and respiratory systems: Secondary | ICD-10-CM | POA: Insufficient documentation

## 2013-07-06 DIAGNOSIS — Z8249 Family history of ischemic heart disease and other diseases of the circulatory system: Secondary | ICD-10-CM | POA: Insufficient documentation

## 2013-07-06 DIAGNOSIS — I2510 Atherosclerotic heart disease of native coronary artery without angina pectoris: Secondary | ICD-10-CM

## 2013-07-06 DIAGNOSIS — Z87891 Personal history of nicotine dependence: Secondary | ICD-10-CM | POA: Insufficient documentation

## 2013-07-06 DIAGNOSIS — E785 Hyperlipidemia, unspecified: Secondary | ICD-10-CM

## 2013-07-06 DIAGNOSIS — I10 Essential (primary) hypertension: Secondary | ICD-10-CM | POA: Insufficient documentation

## 2013-07-06 MED ORDER — TECHNETIUM TC 99M SESTAMIBI GENERIC - CARDIOLITE
30.0000 | Freq: Once | INTRAVENOUS | Status: AC | PRN
  Administered 2013-07-06: 30 via INTRAVENOUS

## 2013-07-06 MED ORDER — TECHNETIUM TC 99M SESTAMIBI GENERIC - CARDIOLITE
10.0000 | Freq: Once | INTRAVENOUS | Status: AC | PRN
  Administered 2013-07-06: 10 via INTRAVENOUS

## 2013-07-06 MED ORDER — REGADENOSON 0.4 MG/5ML IV SOLN
0.4000 mg | Freq: Once | INTRAVENOUS | Status: AC
  Administered 2013-07-06: 0.4 mg via INTRAVENOUS

## 2013-07-06 NOTE — Procedures (Addendum)
[facility] [address] [place] [address] [place] [address] [phone]  Cardiology Nuclear Med Study  Derrick Rivera is a 79 y.o. male     MRN : [id number]     DOB: [birth year]  Procedure Date: 07/06/2013  Nuclear Med Background Indication for Stress Test:  Stent Patency and PTCA Patency History:  CAD;STENT/PTCA--01/05/02 Cardiac Risk Factors: Family History - CAD, History of Smoking, Hypertension, Lipids, Overweight and PVD  Symptoms:  Dizziness, DOE, Light-Headedness and SOB   Nuclear Pre-Procedure Caffeine/Decaff Intake:  7:00pm NPO After: 5:00am   IV Site: R Antecubital  IV 0.9% NS with Angio Cath:  22g  Chest Size (in):  42"  IV Started by: Emmit Pomfret, RN  Height: 5\' 9"  (1.753 m)  Cup Size: n/a  BMI:  Body mass index is 25.83 kg/(m^2). Weight:  175 lb (79.379 kg)   Tech Comments:  N/A    Nuclear Med Study 1 or 2 day study: 1 day  Stress Test Type:  Lexiscan  Order Authorizing Provider:  Nicki Guadalajara, MD   Resting Radionuclide: Technetium 18m Sestamibi  Resting Radionuclide Dose: 10.6 mCi   Stress Radionuclide:  Technetium 7m Sestamibi  Stress Radionuclide Dose: 32.4 mCi           Stress Protocol Rest HR: 51 Stress HR: 54  Rest BP: 143/89 Stress BP: 137/96  Exercise Time (min): n/a METS: n/a   Predicted Max HR: 134 bpm % Max HR: 49.25 bpm Rate Pressure Product: 9438  Dose of Adenosine (mg):  n/a Dose of Lexiscan: 0.4 mg  Dose of Atropine (mg): n/a Dose of Dobutamine: n/a mcg/kg/min (at max HR)  Stress Test Technologist: Esperanza Sheets, CCT Nuclear Technologist: Koren Shiver, CNMT   Rest Procedure:  Myocardial perfusion imaging was performed at rest 45 minutes following the intravenous administration of Technetium 63m Sestamibi. Stress Procedure:  The patient received IV Lexiscan 0.4 mg over 15-seconds.  Technetium 44m Sestamibi injected at 30-seconds.  There were no significant changes with Lexiscan.   Quantitative spect images were obtained after a 45 minute delay.  Transient Ischemic Dilatation (Normal <1.22):  0.99  Lung/Heart Ratio (Normal <0.45):  0.31 QGS EDV:  69 ml QGS ESV:  30 ml LV Ejection Fraction: 57%  Rest ECG: NSR - Normal EKG  Stress ECG: No significant change from baseline ECG  QPS Raw Data Images:  Normal; no motion artifact; normal heart/lung ratio. Stress Images:  There is decreased uptake in the lateral wall.and inferior wall. Rest Images:  There is decreased uptake in the lateral wall.and inferior wall. Subtraction (SDS):  No evidence of ischemia. Suggestive of LCx territory scar.  Impression Exercise Capacity:  Lexiscan with no exercise. BP Response:  Normal blood pressure response. Clinical Symptoms:  There is dyspnea. ECG Impression:  No significant ECG changes with Lexiscan. Comparison with Prior Nuclear Study: No significant change from previous study  Overall Impression:  Low risk stress nuclear study with a fixed inferolateral defect consistent with prior LCx territory scar. There is no reversible ischemia..  LV Wall Motion:  LVEF 57%, mild inferolateral hypokinesis.  Chrystie Nose, MD, [facility] Board Certified in Nuclear Cardiology Attending Cardiologist [facility]  Chrystie Nose, MD  07/06/2013 11:23 AM

## 2013-07-12 ENCOUNTER — Ambulatory Visit (INDEPENDENT_AMBULATORY_CARE_PROVIDER_SITE_OTHER): Payer: PRIVATE HEALTH INSURANCE | Admitting: Cardiovascular Disease

## 2013-07-12 ENCOUNTER — Encounter: Payer: Self-pay | Admitting: Cardiovascular Disease

## 2013-07-12 VITALS — BP 122/88 | HR 68 | Ht 70.0 in | Wt 177.8 lb

## 2013-07-12 DIAGNOSIS — I2510 Atherosclerotic heart disease of native coronary artery without angina pectoris: Secondary | ICD-10-CM

## 2013-07-12 DIAGNOSIS — G4730 Sleep apnea, unspecified: Secondary | ICD-10-CM

## 2013-07-12 DIAGNOSIS — E7800 Pure hypercholesterolemia, unspecified: Secondary | ICD-10-CM

## 2013-07-12 NOTE — Patient Instructions (Addendum)
Your physician has recommended that you have a sleep study. This test records several body functions during sleep, including: brain activity, eye movement, oxygen and carbon dioxide blood levels, heart rate and rhythm, breathing rate and rhythm, the flow of air through your mouth and nose, snoring, body muscle movements, and chest and belly movement.  Your physician has recommended you make the following change in your medication: INCREASE your aspirin to 325 mg daily.   Your physician recommends that you schedule a follow-up appointment in: 2-3 MONTHS.

## 2013-07-15 ENCOUNTER — Encounter: Payer: Self-pay | Admitting: *Deleted

## 2013-07-17 ENCOUNTER — Encounter: Payer: Self-pay | Admitting: Cardiovascular Disease

## 2013-07-17 NOTE — Progress Notes (Signed)
Patient ID: Derrick Rivera, male   DOB: 16-Oct-1924, 79 y.o.   MRN: [id number]     HPI: Derrick Rivera, is a 79 y.o. male who presents for a six-month cardiology followup evaluation.  Derrick Rivera is now 79 years old. 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 In 2003 he underwent complex high-speed rotational atherectomy of his entire very calcified LAD system at multiple sites. At that time, I placed a 3.0x15 mm and 3.0x12 mm S7 stent in the proximal to mid region. His last nuclear perfusion study in June 2012 that showed an ejection fraction of 58% with moderate perfusion defect compatible with inferior scar with minimal peri-infarction ischemia concordant with his known mid RCA occlusion. Since that time, he underwent a 2 year followup nuclear perfusion study on 07/06/2013. This essentially was unchanged and showed an ejection fraction of 57% with mild inferolateral hypokinesis and a small inferolateral defect consistent with his prior abnormality. There was no significant reversible ischemia.  Additional problems include hypertension, hyperlipidemia, lymphoma followed by Derrick Rivera, and he is status post Mohs surgery for skin cancer with reconstructive plastic surgery at [facility].  Over the last 6 months, he has remained relatively stable without chest pain symptoms. He does note some mild shortness of breath appear he states that he has had 2 episodes of short-term memory loss.  Past Medical History  Diagnosis Date  . Allergic rhinitis   . HTN (hypertension)   . Coronary atherosclerosis of unspecified type of vessel, native or graft   . Peripheral vascular disease   . Hypercholesterolemia   . GERD (gastroesophageal reflux disease)   . Diverticulosis of colon   . History of colonic polyps   . DJD (degenerative joint disease)   . Non Hodgkin's lymphoma   . Thrombocytopenia   . Observed sleep apnea   . Sleep apnea 05-26-2010    sleep study on  05-26-2010    Past Surgical History  Procedure Laterality Date  . Total knee arthroplasty  03-2002    Derrick Rivera  . Total shoulder arthroplasty  03-2005    Derrick Rivera  . Cardiac catheterization      Allergies  Allergen Reactions  . Penicillins     Did not help with strep throat    Current Outpatient Prescriptions  Medication Sig Dispense Refill  . Ascorbic Acid (VITAMIN C) 500 MG tablet Take 500 mg by mouth daily.        Marland Kitchen aspirin 81 MG tablet Take 2 tablets by mouth daily      . atenolol (TENORMIN) 50 MG tablet Take 1/2 tablet by mouth two times daily      . azelastine (ASTELIN) 137 MCG/SPRAY nasal spray Place 2 sprays into the nose 2 (two) times daily. Use in each nostril as directed  90 mL  3  . B Complex-C-Calcium (B-COMPLEX/VITAMIN C) TABS Take one tablet by mouth every 3 days       . fish oil-omega-3 fatty acids 1000 MG capsule Take one capsule by mouth daily      . fluticasone (FLONASE) 50 MCG/ACT nasal spray Place 2 sprays into the nose daily.  48 g  3  . isosorbide mononitrate (IMDUR) 60 MG 24 hr tablet Take 60 mg by mouth daily.        Marland Kitchen lisinopril (PRINIVIL,ZESTRIL) 10 MG tablet Take 10 mg by mouth daily.        . Multiple Vitamins-Minerals (MULTIVITAMIN WITH MINERALS) tablet Take  one tablet by mouth every 3 days      . omeprazole (PRILOSEC) 20 MG capsule Take one tablet by mouth 30 minutes prior to dinner  90 capsule  3  . rosuvastatin (CRESTOR) 10 MG tablet Take 10 mg by mouth daily.        . verapamil (CALAN-SR) 240 MG CR tablet Take 240 mg by mouth at bedtime.        . [DISCONTINUED] ezetimibe-simvastatin (VYTORIN) 10-20 MG per tablet Take 1 tablet by mouth at bedtime.         No current facility-administered medications for this visit.    History   Social History  . Marital Status: Married    Spouse Name: Derrick Rivera    Number of Children: 3  . Years of Education: N/A   Occupational History  . retired-sales Art gallery manager    Social History Main Topics  . Smoking  status: Former Smoker -- 2.00 packs/day for 20 years    Types: Cigarettes    Quit date: 12/01/1966  . Smokeless tobacco: Never Used  . Alcohol Use: No     Comment: quit in 1954  . Drug Use: No  . Sexual Activity: Not on file   Other Topics Concern  . Not on file   Social History Narrative  . No narrative on file    Family History  Problem Relation Age of Onset  . Pancreatic cancer Mother   . Heart disease Father    Social history is notable that he is married has 3 children 5 grandchildren. There is a remote tobacco history but he quit over 40 years ago.  ROS is negative for fevers, chills or night sweats.  He is status post plastic surgery for his skin cancer with scarring of his nose. He admits to transient episodes of short-term memory loss. He denies associated paresthesias or paraplegia. He denies chest pain. He denies abdominal pain. At times her some mild shortness of breath. He denies significant leg swelling. He denies bleeding. He does have sleep issues. In 2011 his AHI was 27 suggestive of moderate sleep apnea. Apparently he never went for his titration study. Other system review is negative.  PE BP 122/88  Pulse 68  Ht 5\' 10"  (1.778 m)  Wt 177 lb 12.8 oz (80.65 kg)  BMI 25.51 kg/m2  General: Alert, oriented, no distress.  Skin: normal turgor, no rashes HEENT: Normocephalic, atraumatic. Pupils round and reactive; sclera anicteric;no lid lag.  Nose without nasal septal hypertrophy, scarring secondary to prior nasal surgery. Mouth/Parynx benign; Mallinpatti scale 3 Neck: No JVD, no carotid briuts Lungs: clear to ausculatation and percussion; no wheezing or rales Heart: RRR, s1 s2 normal 1/6 systolic murmur Abdomen: Mild diastases recti. No renal artery bruits. soft, nontender; no hepatosplenomehaly, BS+; abdominal aorta nontender and not dilated by palpation. Pulses 2+ Extremities: no clubbing cyanosis or edema, Homan's sign negative  Neurologic: grossly  nonfocal  ECG: Sinus rhythm at 68 beats per minute. Intervals normal.  LABS:  BMET    Component Value Date/Time   NA 140 04/27/2007 0816   K 4.1 04/27/2007 0816   CL 105 04/27/2007 0816   CO2 29 04/27/2007 0816   GLUCOSE 95 04/27/2007 0816   BUN 18 04/27/2007 0816   CREATININE 1.1 04/27/2007 0816   CALCIUM 9.1 04/27/2007 0816   GFRNONAA 68 04/27/2007 0816   GFRAA 83 04/27/2007 0816     Hepatic Function Panel     Component Value Date/Time   PROT 6.3 04/27/2007 0816  ALBUMIN 3.9 04/27/2007 0816   AST 29 04/27/2007 0816   ALT 23 04/27/2007 0816   ALKPHOS 50 04/27/2007 0816   BILITOT 1.3* 04/27/2007 0816   BILIDIR 0.2 04/27/2007 0816     CBC    Component Value Date/Time   WBC 7.1 11/02/2012 1046   WBC 7.0 04/27/2007 0816   RBC 4.54 11/02/2012 1046   RBC 4.51 04/27/2007 0816   HGB 15.0 11/02/2012 1046   HGB 15.1 04/27/2007 0816   HCT 42.6 11/02/2012 1046   HCT 44.5 04/27/2007 0816   PLT 115* 11/02/2012 1046   PLT 126* 04/27/2007 0816   MCV 93.8 11/02/2012 1046   MCV 98.7 04/27/2007 0816   MCH 33.0 11/02/2012 1046   MCHC 35.2 11/02/2012 1046   MCHC 34.0 04/27/2007 0816   RDW 13.3 11/02/2012 1046   RDW 12.5 04/27/2007 0816   LYMPHSABS 1.7 11/02/2012 1046   MONOABS 0.7 11/02/2012 1046   MONOABS 0.6 04/27/2007 0816   EOSABS 0.2 11/02/2012 1046   EOSABS 0.3 04/27/2007 0816   BASOSABS 0.0 11/02/2012 1046   BASOSABS 0.0 04/27/2007 0816     BNP No results found for this basename: probnp    Lipid Panel     Component Value Date/Time   CHOL 125 04/27/2007 0816   TRIG 83 04/27/2007 0816   HDL 45.3 04/27/2007 0816   CHOLHDL 2.8 CALC 04/27/2007 0816   VLDL 17 04/27/2007 0816   LDLCALC 63 04/27/2007 0816     RADIOLOGY: No results found.    ASSESSMENT AND PLAN: Mr. Lacivita has known RCA occlusion by initial catheterization in 1995. He is now 11 years status post complex high-speed rotational appendectomy and stenting of his proximal and mid LAD. His most recent nuclear study is unchanged from 2  years previously and shows an area of small inferolateral scar. No significant ischemia was demonstrated on the present study. He has experienced 2 episodes of short-term memory loss. I am recommending he increase his aspirin to 325 mg. I did discuss with him the possibility of neurologic evaluation to make certain he this is not representative of TIA. His blood pressure presently is stable at 122/82 when rechecked by me. I reviewed his nuclear perfusion study.   In 2011, Mr. Ishida did undergo a sleep study which confirmed moderate sleep apnea with an HIF 27.0. However, at that time he was noted to have both obstructive and central events but he never followup and underwent his titration of evaluation. I suggested that this be under taken and we'll schedule him for split night protocol. I plan to see him back in sleep clinic for followup evaluation. Otherwise, I will see him in 6 months for followup cardiology evaluation.     Lennette Bihari, MD, [facility]  07/17/2013 1:43 PM

## 2013-08-10 DIAGNOSIS — G4731 Primary central sleep apnea: Secondary | ICD-10-CM

## 2013-08-10 DIAGNOSIS — G4761 Periodic limb movement disorder: Secondary | ICD-10-CM

## 2013-08-10 DIAGNOSIS — G4733 Obstructive sleep apnea (adult) (pediatric): Secondary | ICD-10-CM

## 2013-08-14 ENCOUNTER — Other Ambulatory Visit: Payer: Self-pay | Admitting: *Deleted

## 2013-08-14 DIAGNOSIS — I2510 Atherosclerotic heart disease of native coronary artery without angina pectoris: Secondary | ICD-10-CM

## 2013-08-24 ENCOUNTER — Ambulatory Visit (HOSPITAL_COMMUNITY)
Admission: RE | Admit: 2013-08-24 | Discharge: 2013-08-24 | Disposition: A | Discharge status: Home / Self Care | Payer: PRIVATE HEALTH INSURANCE | Source: Ambulatory Visit | Attending: Cardiovascular Disease | Admitting: Cardiovascular Disease

## 2013-08-24 DIAGNOSIS — I2510 Atherosclerotic heart disease of native coronary artery without angina pectoris: Secondary | ICD-10-CM | POA: Insufficient documentation

## 2013-08-24 DIAGNOSIS — I6529 Occlusion and stenosis of unspecified carotid artery: Secondary | ICD-10-CM

## 2013-08-24 NOTE — Progress Notes (Signed)
Carotid Duplex Completed. Hong Moring, BS, RDMS, RVT  

## 2013-09-02 ENCOUNTER — Encounter: Payer: Self-pay | Admitting: *Deleted

## 2013-09-02 NOTE — Progress Notes (Signed)
Quick Note:  Carotid result note sent to patient. ______

## 2013-09-05 ENCOUNTER — Encounter: Payer: Self-pay | Admitting: Cardiovascular Disease

## 2013-09-12 ENCOUNTER — Ambulatory Visit (INDEPENDENT_AMBULATORY_CARE_PROVIDER_SITE_OTHER): Payer: PRIVATE HEALTH INSURANCE

## 2013-09-12 DIAGNOSIS — Z23 Encounter for immunization: Secondary | ICD-10-CM

## 2013-10-31 ENCOUNTER — Other Ambulatory Visit: Payer: Self-pay | Admitting: Pulmonary Disease

## 2013-11-03 ENCOUNTER — Ambulatory Visit (HOSPITAL_BASED_OUTPATIENT_CLINIC_OR_DEPARTMENT_OTHER): Payer: PRIVATE HEALTH INSURANCE | Admitting: Oncology

## 2013-11-03 ENCOUNTER — Telehealth: Payer: Self-pay | Admitting: Oncology

## 2013-11-03 ENCOUNTER — Encounter (INDEPENDENT_AMBULATORY_CARE_PROVIDER_SITE_OTHER): Payer: Self-pay

## 2013-11-03 ENCOUNTER — Other Ambulatory Visit (HOSPITAL_BASED_OUTPATIENT_CLINIC_OR_DEPARTMENT_OTHER): Payer: PRIVATE HEALTH INSURANCE

## 2013-11-03 VITALS — BP 126/69 | HR 58 | Temp 96.9°F | Resp 20 | Ht 70.0 in | Wt 174.5 lb

## 2013-11-03 DIAGNOSIS — C8589 Other specified types of non-Hodgkin lymphoma, extranodal and solid organ sites: Secondary | ICD-10-CM

## 2013-11-03 DIAGNOSIS — M542 Cervicalgia: Secondary | ICD-10-CM

## 2013-11-03 DIAGNOSIS — D696 Thrombocytopenia, unspecified: Secondary | ICD-10-CM

## 2013-11-03 DIAGNOSIS — C4491 Basal cell carcinoma of skin, unspecified: Secondary | ICD-10-CM

## 2013-11-03 LAB — CBC WITH DIFFERENTIAL/PLATELET
BASO%: 0.5 % (ref 0.0–2.0)
Basophils Absolute: 0 10*3/uL (ref 0.0–0.1)
EOS%: 3.5 % (ref 0.0–7.0)
MCH: 33.6 pg — ABNORMAL HIGH (ref 27.2–33.4)
MCHC: 34.1 g/dL (ref 32.0–36.0)
MCV: 98.7 fL — ABNORMAL HIGH (ref 79.3–98.0)
MONO%: 9.2 % (ref 0.0–14.0)
RBC: 4.65 10*6/uL (ref 4.20–5.82)
RDW: 13.8 % (ref 11.0–14.6)
lymph#: 1.9 10*3/uL (ref 0.9–3.3)

## 2013-11-03 NOTE — Telephone Encounter (Signed)
Dec cal mailed to pt after MW added txs shh °

## 2013-11-03 NOTE — Progress Notes (Signed)
   Derrick Rivera    OFFICE PROGRESS NOTE   INTERVAL HISTORY:   Derrick Rivera returns for scheduled followup of non-Hodgkin's lymphoma. He feels well. He continues to work around his home. No palpable lymph nodes. Occasional soreness in the right submandibular area. He reports removal of multiple skin cancers. No abdominal pain.  Objective:  Vital signs in last 24 hours:  Blood pressure 126/69, pulse 58, temperature 96.9 F (36.1 C), temperature source Oral, resp. rate 20, height 5\' 10"  (1.778 m), weight 174 lb 8 oz (79.153 kg).    HEENT: Neck without mass, oropharynx without visible mass Lymphatics: No cervical, supraclavicular, axillary, or inguinal nodes Resp: Lungs clear bilaterally Cardio: Regular rate and rhythm GI: No hepatosplenomegaly, the tip of a mass is palpable superior and to the right of the umbilicus. The mass is mobile Vascular: No leg edema  Skin: Multiple moles over the face and neck with multiple scars from surgical procedures    Lab Results:  Lab Results  Component Value Date   WBC 8.4 11/03/2013   HGB 15.6 11/03/2013   HCT 45.9 11/03/2013   MCV 98.7* 11/03/2013   PLT 122* 11/03/2013   ANC 5.4   Medications: I have reviewed the patient's current medications.  Assessment/Plan: 1. Non-Hodgkin lymphoma diagnosed in 1996. He has been maintained off of specific therapy since 2000. a. New lymph nodes in the right neck on exam 08/23/2009. b. Restaging CT evaluation 12/21/2008 revealed a stable mesenteric mass and no evidence for progression of lymphoma. 2. Chronic mild thrombocytopenia. 3. "Mohs" surgery for treatment of a skin cancer at the right side of the nose.  a. He has undergone surgery for a local recurrence of the basal cell carcinoma with repeat reconstruction procedures 4. Chronic neck pain.  Disposition:  He remains in clinical remission from non-Hodgkin's lymphoma. He reports receiving an influenza vaccine this year. He will ask Dr.  Kriste Basque when he last had a pneumococcal vaccine. I recommend he continue the pneumococcal vaccine every 5 years.  He would like to continue followup at the [facility]. He will return for an office visit in one year.   Thornton Papas, MD  11/03/2013  10:17 AM

## 2013-11-08 ENCOUNTER — Telehealth: Payer: Self-pay | Admitting: Pulmonary Disease

## 2013-11-08 NOTE — Telephone Encounter (Signed)
I called and spoke with pt. He reports he has had a PNA in the past but does not remember when since it has been a long time. Please advise SN if pt needs a PNA vaccine and if so which type? thanks

## 2013-11-08 NOTE — Telephone Encounter (Signed)
Called and spoke with pt and he is aware that per SN  Ok to get the PNA 23 vaccine.  Pt is aware that we will do this at his next appt with SN in 12-2013.

## 2013-11-21 ENCOUNTER — Institutional Professional Consult (permissible substitution): Payer: PRIVATE HEALTH INSURANCE | Admitting: Internal Medicine

## 2013-12-05 ENCOUNTER — Telehealth: Payer: Self-pay | Admitting: Cardiovascular Disease

## 2013-12-05 NOTE — Telephone Encounter (Signed)
Needs to have all new scripts written for all medications for new mail order company  [facility])  Please call

## 2013-12-06 ENCOUNTER — Telehealth: Payer: Self-pay | Admitting: Cardiovascular Disease

## 2013-12-06 NOTE — Telephone Encounter (Signed)
Mariann Laster was working on this from yesterday.

## 2013-12-06 NOTE — Telephone Encounter (Signed)
His ID number is [id number]- He need the following medicine with 90 days supply  for each OYD:XAJOINOM Tabs 50 mg,Crestot 10 mg,Lisinopril 10 mg,Verapamil HCLER 240 mg, and Isosorbide Mono Er 60 mg.Please fax to [facility]  [phone].

## 2013-12-06 NOTE — Telephone Encounter (Signed)
Pt called again-need all new prescriptions changed to a new mail order company.Please call today if possible.

## 2013-12-07 ENCOUNTER — Other Ambulatory Visit: Payer: Self-pay | Admitting: *Deleted

## 2013-12-07 MED ORDER — ISOSORBIDE MONONITRATE ER 60 MG PO TB24: 60.0000 mg | ORAL_TABLET | Freq: Every day | ORAL | Status: DC

## 2013-12-07 MED ORDER — ROSUVASTATIN CALCIUM 10 MG PO TABS: 10.0000 mg | ORAL_TABLET | Freq: Every day | ORAL | Status: DC

## 2013-12-07 MED ORDER — LISINOPRIL 10 MG PO TABS: 10.0000 mg | ORAL_TABLET | Freq: Every day | ORAL | Status: DC

## 2013-12-07 MED ORDER — VERAPAMIL HCL ER 240 MG PO TBCR: 240.0000 mg | EXTENDED_RELEASE_TABLET | Freq: Every day | ORAL | Status: DC

## 2013-12-07 NOTE — Telephone Encounter (Signed)
Refilled patients prescriptions to [facility] delivery per patient request.

## 2013-12-20 ENCOUNTER — Ambulatory Visit (INDEPENDENT_AMBULATORY_CARE_PROVIDER_SITE_OTHER): Payer: Medicare HMO | Admitting: Pulmonary Disease

## 2013-12-20 ENCOUNTER — Ambulatory Visit (INDEPENDENT_AMBULATORY_CARE_PROVIDER_SITE_OTHER)
Admission: RE | Admit: 2013-12-20 | Discharge: 2013-12-20 | Disposition: A | Discharge status: Home / Self Care | Payer: Medicare HMO | Source: Ambulatory Visit | Attending: Pulmonary Disease | Admitting: Pulmonary Disease

## 2013-12-20 ENCOUNTER — Encounter: Payer: Self-pay | Admitting: Pulmonary Disease

## 2013-12-20 VITALS — BP 118/70 | HR 62 | Temp 98.6°F | Ht 69.0 in | Wt 178.0 lb

## 2013-12-20 DIAGNOSIS — I10 Essential (primary) hypertension: Secondary | ICD-10-CM

## 2013-12-20 DIAGNOSIS — C8589 Other specified types of non-Hodgkin lymphoma, extranodal and solid organ sites: Secondary | ICD-10-CM

## 2013-12-20 DIAGNOSIS — K5730 Diverticulosis of large intestine without perforation or abscess without bleeding: Secondary | ICD-10-CM

## 2013-12-20 DIAGNOSIS — M1990 Unspecified osteoarthritis, unspecified site: Secondary | ICD-10-CM

## 2013-12-20 DIAGNOSIS — E7800 Pure hypercholesterolemia, unspecified: Secondary | ICD-10-CM

## 2013-12-20 DIAGNOSIS — Z23 Encounter for immunization: Secondary | ICD-10-CM

## 2013-12-20 DIAGNOSIS — J309 Allergic rhinitis, unspecified: Secondary | ICD-10-CM

## 2013-12-20 DIAGNOSIS — I739 Peripheral vascular disease, unspecified: Secondary | ICD-10-CM

## 2013-12-20 DIAGNOSIS — K219 Gastro-esophageal reflux disease without esophagitis: Secondary | ICD-10-CM

## 2013-12-20 DIAGNOSIS — I2510 Atherosclerotic heart disease of native coronary artery without angina pectoris: Secondary | ICD-10-CM

## 2013-12-20 DIAGNOSIS — D126 Benign neoplasm of colon, unspecified: Secondary | ICD-10-CM

## 2013-12-20 MED ORDER — HYDROCORTISONE 2.5 % RE CREA: TOPICAL_CREAM | RECTAL | Status: DC

## 2013-12-20 MED ORDER — AZELASTINE HCL 0.1 % NA SOLN: NASAL | Status: DC

## 2013-12-20 NOTE — Patient Instructions (Addendum)
Today we updated your med list in our EPIC system...    Continue your current medications the same...  For the dripping nose>>    Take an OTC antihistamine like Claritin, Zyrtek, or Allegra once daily...    Try the ASTELIN nasal spray- 1-2 sprays in each nostril once or twice daily as needed...  For the chest congestion>>    Today we did a follow up CXR> we will call you w/ the results...    Try the OTC MUCINEX 600mg  tabs- 2 tabs twice daily w/ fluids...    You may also use the OTC DELSYM cough syrup- 2 tsp twice daily as needed for cough...  We also wrote for a hemorrhoid cream- ANUSOL HC to use as needed...    At your convenience check in w/ DrGessner in GI at [phone]...  We gave you the [place] today...  We wrote a prescription for the Shingles vaccine as well...  Call for any questions.Marland KitchenMarland Kitchen

## 2013-12-20 NOTE — Progress Notes (Signed)
Subjective:    Patient ID: Derrick Rivera, male    DOB: 02-27-1924, 80 y.o.   MRN: [id number]  HPI 80 y/o WM here for a follow up visit... he has multiple medical problems as noted below...   ~  he is followed regularly by [facility] for SEHV> his notes & blood work are reviewed> Hx CAD- s/p rotational atherectomy & stent... DrKelly was to do a sleep study for OSA symptoms- pt declined this test due to the cost involved... he also planned CDopplers- we don't have these results either but pt tells me they were "OK"... Prev we tried to switch him to Simvastatin from VYTORIN to save him $$$, but he indicates that [facility] wants him on the Vytorin instead...  ~  he is followed by DrSherrill for Oncology w/ hx of non-hodgkins lymphoma dx in 1996, his notes are reviewed as well> chemotherapy finished in 2004 & he has a chronic abdominal/ mesenteric mass, and chronic mild thrombocytopenia w/ Plat count ~ 100K...  ~  Apr 02, 2011:  I last saw Derrick Rivera 7/09 for a yearly check up at that time w/ complaints of sinus drainage treated w/ antihist, Flonase & Astepro at that time... As noted- he is well cared for by [facility] & DrSherrill and they do all his labs etc...  Today he has a few specific requests> wants Rx for NASACORT AQ;  Wants a follow up CXR;  And he's complaining of ~33mo hx of incr reflux symptoms (known HH on prev EGD see below)> & we discussed PPI therapy...  We also reviewed his labs done by [facility] 11/11, and he wants [place] to give him stool cards to chewck for hidden blood...  ~  May 26, 2012:  80mo Derrick Rivera had a bout of [place] 5/13 involving the left T9 distrib, treated by TP w/ Valtrex & Pred & resolved swiftly, no residual problem, we discussed Shingles vaccine & he will check w/ his insurance...  He also reports Moh's surg for recurrent skin cancer on nose & subseq mult plastic procedures at [facility] [place]...     He saw Quentin Ore [facility] 6/12 (note reviewed) & recently 6/13 (note pending)> known total  occlusion of RCA since 1995, & extensive dis in LAD system w/ complex intervention & stents in 2003, he has done well since then; known mild LVH, norm sys function but mild infer wall HK, AoV sclerosis & mild pulmHTN;  Myoview 6/12 w/ infer scar, no ischemia, EF=58%... Carotid duplex testing ~6/12 reported to show mod heterogen plaque w/ 50-69% right ICA stenosis, no change serially    He saw DrSherrill 3/13 for f/u non-hodgkin's lymphoma diagnosed 1996, off therapy since 2000, new right neck lymph node 9/10 & restaging CT showed stable mesenteric mass & no evid of progressive lymphoma; also followed for mild chronic thrombocytopenia, no change...  We reviewed prob list, meds, xrays and labs> see below>> He tells me that [facility] did full labs recently & we have requested copies for EPIC...  ~  August 27, 2012:  29mo ROV & add-on for diarrhea> pt notes that he went to his wife's 65th high school reunion in [place]; the next day he developed watery diarrhea (some leakage/ soiling), mild abd cramping, & gas;  He denies severe localized pain, n/v, f/c/s, etc;  He was started on Align, Activia, & Immodium w/ sl improvement;  We discussed poss "food poisoning" & rec Flagyl 250mg Qid + his other Rx...     HBP controlled on 49meds;  ASHD  followed by [facility] on Imdur & stable;  Chol controlled on Cres10 & FLP followed by Quentin Ore...    We reviewed prob list, meds, xrays and labs> see below for updates >> OK for flu vaccine today... LABS 5/13 from [facility]:  FLP- ok x TG=169;  Chems- wnl;  CBC- wnl;  TSH=1.70   ~  December 20, 2013:  61mo ROV & [place] is c/o drippy nose & rec to try Antihist & Astepro; c/o cough & phlegm for 19mo (CXR- chr changes, NAD) Rec- Mucinex & fluids; c/o hemorrhoid tags- rec Anusol HC cream...    Allergic Rhinitis> on Flonase or Nasacort; he is rec to add OTC Antihist & Astepro for the dripping...    HBP> on Aten25Bid, VerapSR240, Lisin10; BP= 118/70 & he denies HA, CP, palpit, SOB, edema...     ASHD> on ASA81, Imdur60; stable & followed by [facility] for Cards...     Carotid art dis> on ASA81; he denies cerebral ischemic symptoms...    CHOL> on Cres10; he refuses labs here today & EPIC shows FLP 2/14 w/ TChol 152, TG 96, HDL 50, LDL 83    GI- GERD, Divertics, Polyps> off prev Omep20; he denies abd pain, n/v, c/d, blood seen...    DJD> on OTC analgesics as needed...    Lymphoma & Thrombocytopenia> on Vits; followed by DrSherrill- seen 12/14 & doing satis... We reviewed prob list, meds, xrays and labs> see below for updates >> Needs Prevnar-13 today...  CXR 1/15:  Norm heart size, lungs clear x mild scarring at left base, thor spondylosis, arthritis in right shoulder & left shoulder prosthesis.Marland KitchenMarland Kitchen            Problem List:  OSA eval by Quentin Ore  OPHTHALMOLOGIC PROBLEM>  He was referred by DrStoneburner to DrMartin [facility]> prob retinal vascular occlusion (?noted blurred vision after cat surg)... ~  CDopplers done 5/11 showed mild plaque w/ 50-69% right ICA stenosis  ALLERGIC RHINITIS (ICD-477.9) - c/o nasal drainage- we discussed strategies for control including:  OTC Antihist prn,  Saline mist every 1-2H during the day,  FLONASE 2 sp in each nostril Qhs...  HYPERTENSION (ICD-401.9) - on ATENOLOL 50mg - taking 1/2 Bid,  LISINOPRIL 10mg /d,  & CALAN 240mg /d...  ~  7/09:  BP = 126/70 and denies HA, fatigue, visual changes, CP, palipit, dizziness, syncope, dyspnea, edema, etc... ~  1/11:  Labs from [place] w/ normal renal function etc... ~  5/12:  BP= 126/80 & he remains asymptomatic;  CXR today shows left shoulder arthroplasty, advanced arthritic changes in right shoulder, sl eventration right hemidiaph, normal heart size & clear lungs... ~  6/13:  BP= 120/62 & he remains asymptomatic by his hx... ~  9/13:  BP= 112/70 & he denies CP, palpit, SOB, edema, etc... ~  1/15:  BP= 118/70 & CXR 1/15 showed norm heart size, lungs clear x mild scarring at left base, thor spondylosis, arthritis in right  shoulder & left shoulder prosthesis  ATHEROSCLEROTIC HEART DISEASE (ICD-414.00) - on ASA 81mg Bid,  & IMDUR 60mg /d...  ~  1995:  Found to have a total RCA occlusion w/ collaterals... ~  2003:  He had a complex intervention w/ rotatioal atherectomy of his entire LAD system at mult sites w/ 2 stents... ~  baseline EKG shows NSR, sm infer Q's, otherw NAD... ~  2DEcho 9/06 showed mild asymmetric LVH, septal thickening, mild infer HK, mild TR & mild plm HTN w/ RVsys est ~40 ~  NuclearStressTest 4/08 showed sm defect inferiorly from his known  RCA occlusion, normal perfusion anteriorly and laterally where prev LAD rotational atherectomy and stent were done... ~  Repeat nuclear study 12/10 was the same> old scar, no ischemia, & EF= 58% ~  Nuclear stress Test 6/12 showed mod perfusion defect & perinfarct ischemia in inferior regions; EF= 58%; no change from prev... ~  8/14: he is followed by [facility] for Cards (note reviewed)> stable & they discussed f/u sleep study...  ~  He continues to f/u w/ Quentin Ore every 56mo and his notes are reviewed...  PERIPHERAL VASCULAR DISEASE (ICD-443.9) >> known right carotid disease followed by [facility] w/ Doppler Ultrasound exams... ~  Carotid duplex testing ~6/12 reported to show mod heterogen plaque w/ 50-69% right ICA stenosis, no change serially.  HYPERCHOLESTEROLEMIA (ICD-272.0) - DrKelly switched his Vytorin to CRESTOR 10mg /d, and he follows the labs. ~  [place] 3/09 from [facility] showed TChol 144, TG 122, HDL 58, LDL 63... ~  FLP 11/11 from [facility] showed TChol 141, TG 125, HDL 55, LDL 61 ~  FLP 5/13 from [facility] on Cres10 showed TChol 150, TG 169, HDL 47, LDL 69 ~  He continues to have labs monitored by [facility]...  GERD (ICD-530.81) - last EGD 3/07 by DrSam showed HH, ?stricture- dilated... ~  5/12:  Pt c/o incr GERD symptoms but was only using the Prilosec prn;  Given Nexium samples for 3weeks, then switch back to PRILOSEC 20mg /d...  DIVERTICULOSIS OF COLON  (ICD-562.10) & COLONIC POLYPS (ICD-211.3) - last colonoscopy 3/07 showed divertics, hems, and 67mm polyp = tubular adenoma... f/u planned 40yrs...  DEGENERATIVE JOINT DISEASE (ICD-715.90) - he is s/p left total knee replacement surgery (DrCarter 4/03) and left shoulder arthroplasty (DrNorris 5/06) as well... ~  8/10:  He had full CSpine eval by DrBrooks at [place] w/ multilevel DDD found...  NON-HODGKIN'S LYMPHOMA (ICD-202.80) & THROMBOCYTOPENIA (ICD-287.5) - Eval and Rx by DrSherrill> on observation since 2004 & stable... ~  last CT Abd 5/08 w/ irreg calcified mesenteric lesion c/w treated lymphoma; calcif in AbdAo, divertics... ~  No change in this abn on follow up CT Chest/ Abd/ Pelvis 1/10 by oncology> ?incr adipose tissue in left breast area, atherosclerotic calcif in vessels, severe right shoulder arthritis; calcif mesenteric masses c/w treated lymphoma, renal cyst, lumbar spondy, etc... ~  12/14: he had f/u w/ DrSherrill (note reviewed)> feeling well, no adenopathy, s/p several skin cancers removed, he has stable mesenteric mass (last imaged 2010) & chr mild thrombocytopenia...  ~  He continues to f/u w/ DrSherrill once or twice yearly...  Health Maintenance -  full routine labs from [facility] 3/09 showed PSA= 0.94... ~  Hx skin cancer removed right side of nose via Moh's & required plastic surg from [facility] [place]...   Past Surgical History  Procedure Laterality Date  . Total knee arthroplasty  03-2002    Dr. Eulas Post  . Total shoulder arthroplasty  03-2005    Dr. Veverly Fells  . Cardiac catheterization    . Skin cancer removed  2013    removed from his nose-right side    Outpatient Encounter Prescriptions as of 12/20/2013  Medication Sig  . Ascorbic Acid (VITAMIN C) 500 MG tablet Take 500 mg by mouth daily.    Marland Kitchen aspirin 81 MG tablet Take 324 mg by mouth. Take 4 tablets by mouth daily  . atenolol (TENORMIN) 50 MG tablet Take 1/2 tablet by mouth two times daily  . B Complex-C-Calcium  (B-COMPLEX/VITAMIN C) TABS Take one tablet by mouth every 3 days   . fish  oil-omega-3 fatty acids 1000 MG capsule Take one capsule by mouth daily  . fluticasone (FLONASE) 50 MCG/ACT nasal spray Place 2 sprays into the nose daily.  . isosorbide mononitrate (IMDUR) 60 MG 24 hr tablet Take 1 tablet (60 mg total) by mouth daily.  Marland Kitchen lisinopril (PRINIVIL,ZESTRIL) 10 MG tablet Take 1 tablet (10 mg total) by mouth daily.  . Multiple Vitamins-Minerals (MULTIVITAMIN WITH MINERALS) tablet Take one tablet by mouth every 3 days  . rosuvastatin (CRESTOR) 10 MG tablet Take 1 tablet (10 mg total) by mouth daily.  . verapamil (CALAN-SR) 240 MG CR tablet Take 1 tablet (240 mg total) by mouth at bedtime.  . [DISCONTINUED] azelastine (ASTELIN) 137 MCG/SPRAY nasal spray Place 2 sprays into the nose 2 (two) times daily. Use in each nostril as directed  . [DISCONTINUED] omeprazole (PRILOSEC) 20 MG capsule Take one tablet by mouth 30 minutes prior to dinner    Allergies  Allergen Reactions  . Penicillins     Did not help with strep throat    Current Medications, Allergies, Past Medical History, Past Surgical History, Family History, and Social History were reviewed in [employer] record.   Review of Systems    See HPI - all other systems neg except as noted... The patient denies anorexia, fever, weight loss, weight gain, vision loss, decreased hearing, hoarseness, chest pain, syncope, dyspnea on exertion, peripheral edema, prolonged cough, headaches, hemoptysis, abdominal pain, melena, hematochezia, severe indigestion/heartburn, hematuria, incontinence, muscle weakness, suspicious skin lesions, transient blindness, difficulty walking, depression, unusual weight change, abnormal bleeding, enlarged lymph nodes, and angioedema.     Objective:   Physical Exam    WD, WN, 80 y/o WM in NAD... GENERAL:  Alert & oriented; pleasant & cooperative... HEENT:  [place]/AT, EOM-wnl, PERRLA, EACs-clear,  TMs-wnl, NOSE-clear, THROAT-clear & wnl. NECK:  Supple w/ fairROM; no JVD; normal carotid impulses w/o bruits; no thyromegaly or nodules palpated; no lymphadenopathy. CHEST:  Clear to P & A; without wheezes/ rales/ or rhonchi heard...  HEART:  Regular Rhythm;  faint gr1/6 SEM, without rubs or gallops heard... ABDOMEN:  Soft & nontender; normal bowel sounds; no organomegaly, vague fullness mid-abd above umbilicus/ non-tender... EXT: without deformities, mod arthritic changes; no varicose veins/ +venous insuffic/ no edema. NEURO:  CN's intact;  no focal neuro deficits... DERM:  No lesions noted; no rash etc...  RADIOLOGY DATA:  Reviewed in the EPIC EMR & discussed w/ the patient...  LABORATORY DATA:  Reviewed in the EPIC EMR & discussed w/ the patient...   Assessment & Plan:    AR>  He requests NASACORT AQ- vs FLONASE for better insurance coverage......  HBP>  Controlled on meds, continue same...  ASHD>  Well tended by [facility] & stable on the ASA, & IMDUR w/o angina, etc...  ASPVD>  On ASA bid & DrKelly following CDopplers etc... Retinal vasc occlusion, but everything is stable now...  CHOL>  FLP looks good on the Cres10...  GI>  He is c/o incr GERD & we discussed Nexium samples x 3wks the switch to [facility] OTC dailt & stay on this...  LYMPHOMA>  Followed by DrSherrill & stable as noted.Marland KitchenMarland Kitchen

## 2014-01-25 ENCOUNTER — Other Ambulatory Visit: Payer: Self-pay | Admitting: *Deleted

## 2014-01-25 MED ORDER — ATENOLOL 50 MG PO TABS: 25.0000 mg | ORAL_TABLET | Freq: Two times a day (BID) | ORAL | Status: DC

## 2014-01-25 NOTE — Telephone Encounter (Signed)
Rx was sent to pharmacy electronically. 

## 2014-03-16 ENCOUNTER — Telehealth: Payer: Self-pay | Admitting: Pulmonary Disease

## 2014-03-16 NOTE — Telephone Encounter (Signed)
Pt is not currently on prednisone. We have not sent in prednisone for pt since 2013. I called and made [facility] aware. He reports if pt is needing this then he will advise pt to call our office. Will sign off message

## 2014-05-31 ENCOUNTER — Encounter (HOSPITAL_COMMUNITY): Payer: Self-pay | Admitting: Emergency Medicine

## 2014-05-31 ENCOUNTER — Emergency Department (HOSPITAL_COMMUNITY): Payer: Medicare HMO

## 2014-05-31 ENCOUNTER — Emergency Department (HOSPITAL_COMMUNITY)
Admission: EM | Admit: 2014-05-31 | Discharge: 2014-05-31 | Disposition: A | Discharge status: Home / Self Care | Payer: Medicare HMO | Attending: Emergency Medicine | Admitting: Emergency Medicine

## 2014-05-31 DIAGNOSIS — Z8739 Personal history of other diseases of the musculoskeletal system and connective tissue: Secondary | ICD-10-CM | POA: Insufficient documentation

## 2014-05-31 DIAGNOSIS — S59919A Unspecified injury of unspecified forearm, initial encounter: Secondary | ICD-10-CM

## 2014-05-31 DIAGNOSIS — Y9389 Activity, other specified: Secondary | ICD-10-CM | POA: Insufficient documentation

## 2014-05-31 DIAGNOSIS — Z860100 Personal history of colon polyps, unspecified: Secondary | ICD-10-CM | POA: Insufficient documentation

## 2014-05-31 DIAGNOSIS — Z880 Allergy status to penicillin: Secondary | ICD-10-CM | POA: Insufficient documentation

## 2014-05-31 DIAGNOSIS — Y9289 Other specified places as the place of occurrence of the external cause: Secondary | ICD-10-CM | POA: Insufficient documentation

## 2014-05-31 DIAGNOSIS — S6990XA Unspecified injury of unspecified wrist, hand and finger(s), initial encounter: Secondary | ICD-10-CM | POA: Insufficient documentation

## 2014-05-31 DIAGNOSIS — Z87898 Personal history of other specified conditions: Secondary | ICD-10-CM | POA: Insufficient documentation

## 2014-05-31 DIAGNOSIS — I2510 Atherosclerotic heart disease of native coronary artery without angina pectoris: Secondary | ICD-10-CM | POA: Insufficient documentation

## 2014-05-31 DIAGNOSIS — S0180XA Unspecified open wound of other part of head, initial encounter: Secondary | ICD-10-CM | POA: Insufficient documentation

## 2014-05-31 DIAGNOSIS — E7800 Pure hypercholesterolemia, unspecified: Secondary | ICD-10-CM | POA: Insufficient documentation

## 2014-05-31 DIAGNOSIS — S59909A Unspecified injury of unspecified elbow, initial encounter: Secondary | ICD-10-CM | POA: Insufficient documentation

## 2014-05-31 DIAGNOSIS — Z8719 Personal history of other diseases of the digestive system: Secondary | ICD-10-CM | POA: Insufficient documentation

## 2014-05-31 DIAGNOSIS — Z862 Personal history of diseases of the blood and blood-forming organs and certain disorders involving the immune mechanism: Secondary | ICD-10-CM | POA: Insufficient documentation

## 2014-05-31 DIAGNOSIS — Z87891 Personal history of nicotine dependence: Secondary | ICD-10-CM | POA: Insufficient documentation

## 2014-05-31 DIAGNOSIS — W010XXA Fall on same level from slipping, tripping and stumbling without subsequent striking against object, initial encounter: Secondary | ICD-10-CM | POA: Insufficient documentation

## 2014-05-31 DIAGNOSIS — Z7982 Long term (current) use of aspirin: Secondary | ICD-10-CM | POA: Insufficient documentation

## 2014-05-31 DIAGNOSIS — I10 Essential (primary) hypertension: Secondary | ICD-10-CM | POA: Insufficient documentation

## 2014-05-31 DIAGNOSIS — IMO0002 Reserved for concepts with insufficient information to code with codable children: Secondary | ICD-10-CM

## 2014-05-31 NOTE — ED Provider Notes (Signed)
Medical screening examination/treatment/procedure(s) were conducted as a shared visit with non-physician practitioner(s) and myself.  I personally evaluated the patient during the encounter.   EKG Interpretation None        Malvin Johns, MD 05/31/14 2145

## 2014-05-31 NOTE — ED Provider Notes (Signed)
CSN: [id number]     Arrival date & time 05/31/14  1608 History   First MD Initiated Contact with Patient 05/31/14 1619     Chief Complaint  Patient presents with  . Fall     (Consider location/radiation/quality/duration/timing/severity/associated sxs/prior Treatment) HPI Comments: Patient presents with a laceration. He states he was bending down to pick up a cord and lost his balance, striking his head on the concrete. There is no loss of consciousness. He has a laceration to his forehead. He also has some wounds on his left elbow. He denies any pain anywhere. He denies any neck or back pain. He denies any loss of consciousness. There is no vomiting or diarrhea. He denies any dizziness. He denies any symptoms preceding the fall such as chest pain shortness of breath or dizziness. He denies any recent palpitations. He states his tetanus shot is up-to-date.  Patient is a 80 y.o. male presenting with fall.  Fall Pertinent negatives include no chest pain, no abdominal pain, no headaches and no shortness of breath.    Past Medical History  Diagnosis Date  . Allergic rhinitis   . HTN (hypertension)   . Coronary atherosclerosis of unspecified type of vessel, native or graft   . Peripheral vascular disease   . Hypercholesterolemia   . GERD (gastroesophageal reflux disease)   . Diverticulosis of colon   . History of colonic polyps   . DJD (degenerative joint disease)   . Non Hodgkin's lymphoma   . Thrombocytopenia   . Observed sleep apnea   . Sleep apnea 05-26-2010    sleep study on 05-26-2010   Past Surgical History  Procedure Laterality Date  . Total knee arthroplasty  03-2002    Dr. Eulas Post  . Total shoulder arthroplasty  03-2005    Dr. Veverly Fells  . Cardiac catheterization    . Skin cancer removed  2013    removed from his nose-right side   Family History  Problem Relation Age of Onset  . Pancreatic cancer Mother   . Heart disease Father    History  Substance Use Topics  .  Smoking status: Former Smoker -- 2.00 packs/day for 20 years    Types: Cigarettes    Quit date: 12/01/1966  . Smokeless tobacco: Never Used  . Alcohol Use: No     Comment: quit in 1954    Review of Systems  Constitutional: Negative for fever, chills, diaphoresis and fatigue.  HENT: Negative for congestion, rhinorrhea and sneezing.   Eyes: Negative.   Respiratory: Negative for cough, chest tightness and shortness of breath.   Cardiovascular: Negative for chest pain and leg swelling.  Gastrointestinal: Negative for nausea, vomiting, abdominal pain, diarrhea and blood in stool.  Genitourinary: Negative for frequency, hematuria, flank pain and difficulty urinating.  Musculoskeletal: Negative for arthralgias and back pain.  Skin: Positive for wound. Negative for rash.  Neurological: Negative for dizziness, speech difficulty, weakness, numbness and headaches.      Allergies  Penicillins  Home Medications   Prior to Admission medications   Medication Sig Start Date End Date Taking? Authorizing Provider  Ascorbic Acid (VITAMIN C) 500 MG tablet Take 500 mg by mouth daily.     Yes Historical Provider, MD  aspirin 325 MG tablet Take 325 mg by mouth at bedtime.   Yes Historical Provider, MD  atenolol (TENORMIN) 50 MG tablet Take 0.5 tablets (25 mg total) by mouth 2 (two) times daily. 01/25/14  Yes Troy Sine, MD  B Complex-C-Calcium (B-COMPLEX/VITAMIN C)  TABS Take one tablet by mouth every 3 days    Yes Historical Provider, MD  fish oil-omega-3 fatty acids 1000 MG capsule Take one capsule by mouth daily   Yes Historical Provider, MD  hydrocortisone (ANUSOL-HC) 2.5 % rectal cream Apply as needed for hemorrhoid 12/20/13  Yes Noralee Space, MD  isosorbide mononitrate (IMDUR) 60 MG 24 hr tablet Take 1 tablet (60 mg total) by mouth daily. 12/07/13  Yes Troy Sine, MD  lisinopril (PRINIVIL,ZESTRIL) 10 MG tablet Take 1 tablet (10 mg total) by mouth daily. 12/07/13  Yes Troy Sine, MD   Multiple Vitamins-Minerals (MULTIVITAMIN WITH MINERALS) tablet Take one tablet by mouth every 3 days   Yes Historical Provider, MD  rosuvastatin (CRESTOR) 10 MG tablet Take 1 tablet (10 mg total) by mouth daily. 12/07/13  Yes Troy Sine, MD  verapamil (CALAN-SR) 240 MG CR tablet Take 1 tablet (240 mg total) by mouth at bedtime. 12/07/13  Yes Troy Sine, MD   BP 148/87  Pulse 78  Temp(Src) 98.8 F (37.1 C) (Oral)  Resp 19  SpO2 94% Physical Exam  Constitutional: He is oriented to person, place, and time. He appears well-developed and well-nourished.  HENT:  Head: Normocephalic and atraumatic.  3cm laceration over left eyebrow.  No bony tenderness.  No active bleeding  Eyes: Pupils are equal, round, and reactive to light.  Neck: Normal range of motion. Neck supple.  No pain to c/t/l spine  Cardiovascular: Normal rate, regular rhythm and normal heart sounds.   Pulmonary/Chest: Effort normal and breath sounds normal. No respiratory distress. He has no wheezes. He has no rales. He exhibits no tenderness.  Abdominal: Soft. Bowel sounds are normal. There is no tenderness. There is no rebound and no guarding.  Musculoskeletal: Normal range of motion. He exhibits no edema.  Lymphadenopathy:    He has no cervical adenopathy.  Neurological: He is alert and oriented to person, place, and time. He has normal strength. No cranial nerve deficit or sensory deficit. GCS eye subscore is 4. GCS verbal subscore is 5. GCS motor subscore is 6.  Skin: Skin is warm and dry. No rash noted.  Psychiatric: He has a normal mood and affect.    ED Course  Procedures (including critical care time) Labs Review Labs Reviewed - No data to display  Imaging Review Ct Head Wo Contrast  05/31/2014   CLINICAL DATA:  Fall, head injury  EXAM: CT HEAD WITHOUT CONTRAST  TECHNIQUE: Contiguous axial images were obtained from the base of the skull through the vertex without intravenous contrast.  COMPARISON:  None   FINDINGS: No skull fracture is noted. Paranasal sinuses and mastoid air cells are unremarkable. Atherosclerotic calcifications of carotid siphon. Mild cerebral atrophy. Periventricular and patchy subcortical white matter decreased attenuation probable due to chronic small vessel ischemic changes. No acute cortical infarction. No mass lesion is noted on this unenhanced scan.  IMPRESSION: No acute intracranial abnormality. Mild cerebral atrophy. Periventricular and subcortical white matter decreased attenuation probable due to chronic small vessel ischemic changes.   Electronically Signed   By: Lahoma Crocker M.D.   On: 05/31/2014 18:10     EKG Interpretation None      MDM   Final diagnoses:  Laceration    Pt s/p likely mechanical fall, with laceration to forehead.  No preceeding symptoms that would suggest other cause of fall such as cardiac, vertigo, infectious.  Lac repaired by PA-C.  Head CT without fracture, bleed.  Will  discharge with wound care instructions, f/u with PMD for suture removal    Malvin Johns, MD 05/31/14 1900

## 2014-05-31 NOTE — ED Notes (Signed)
Dressing was changed on elbow

## 2014-05-31 NOTE — ED Notes (Signed)
He states he fell upon tripping on a curb, landing on left elbow and left forehead area, at which he has an open, non-bleeding lac.  He has three sm. Skin tears at post left elbow area.  He is able to compete r.o.m. With his left elbow without pain.  He denies l.o.c. And he denies any neck pain or discomfort.  He is oriented x 4 with clear speech.

## 2014-05-31 NOTE — ED Provider Notes (Signed)
LACERATION REPAIR Performed by: Larene Pickett Authorized by: Larene Pickett Consent: Verbal consent obtained. Risks and benefits: risks, benefits and alternatives were discussed Consent given by: patient Patient identity confirmed: provided demographic data Prepped and Draped in normal sterile fashion Wound explored  Laceration Location: left forehead  Laceration Length: 3 cm  No Foreign Bodies seen or palpated  Anesthesia: local infiltration  Local anesthetic: lidocaine 1% with epinephrine  Anesthetic total: 5 ml  Irrigation method: syringe Amount of cleaning: standard  Skin closure: 4-0 prolene  Number of sutures: 4  Technique: simple interrupted  Patient tolerance: Patient tolerated the procedure well with no immediate complications.  Larene Pickett, PA-C 05/31/14 1943

## 2014-05-31 NOTE — Discharge Instructions (Signed)

## 2014-06-09 ENCOUNTER — Telehealth: Payer: Self-pay | Admitting: Pulmonary Disease

## 2014-06-09 NOTE — Telephone Encounter (Signed)
Called spoke with pt. He is scheduled to come in and see TP on Tuesday at 4 pm. Nothing further needed

## 2014-06-13 ENCOUNTER — Ambulatory Visit (INDEPENDENT_AMBULATORY_CARE_PROVIDER_SITE_OTHER): Payer: Medicare HMO | Admitting: Adult Health

## 2014-06-13 ENCOUNTER — Encounter: Payer: Self-pay | Admitting: Adult Health

## 2014-06-13 VITALS — BP 112/76 | HR 79 | Temp 98.2°F | Wt 177.2 lb

## 2014-06-13 DIAGNOSIS — IMO0002 Reserved for concepts with insufficient information to code with codable children: Secondary | ICD-10-CM

## 2014-06-13 DIAGNOSIS — T148XXA Other injury of unspecified body region, initial encounter: Secondary | ICD-10-CM

## 2014-06-13 NOTE — Progress Notes (Signed)
Subjective:    Patient ID: Derrick Rivera, male    DOB: 31-Jan-1924, 80 y.o.   MRN: [id number]  HPI 87   06/13/2014 ER follow up  Pt was seen in 7/1 for eyebrow laceration . Pt lost his balance bending forward to pickup something outside and hit face on concrete porch.  CT head was neg for acute process.  He denies dizziness, chest pain or syncope/LOC .  No HA.  Wound has healed well with no redness, drainage or fever.             Problem List:  OSA eval by Quentin Ore  OPHTHALMOLOGIC PROBLEM>  He was referred by DrStoneburner to DrMartin [facility]> prob retinal vascular occlusion (?noted blurred vision after cat surg)... ~  CDopplers done 5/11 showed mild plaque w/ 50-69% right ICA stenosis  ALLERGIC RHINITIS (ICD-477.9) - c/o nasal drainage- we discussed strategies for control including:  OTC Antihist prn,  Saline mist every 1-2H during the day,  FLONASE 2 sp in each nostril Qhs...  HYPERTENSION (ICD-401.9) - on ATENOLOL 50mg - taking 1/2 Bid,  LISINOPRIL 10mg /d,  & CALAN 240mg /d...  ~  7/09:  BP = 126/70 and denies HA, fatigue, visual changes, CP, palipit, dizziness, syncope, dyspnea, edema, etc... ~  1/11:  Labs from [place] w/ normal renal function etc... ~  5/12:  BP= 126/80 & he remains asymptomatic;  CXR today shows left shoulder arthroplasty, advanced arthritic changes in right shoulder, sl eventration right hemidiaph, normal heart size & clear lungs... ~  6/13:  BP= 120/62 & he remains asymptomatic by his hx... ~  9/13:  BP= 112/70 & he denies CP, palpit, SOB, edema, etc... ~  1/15:  BP= 118/70 & CXR 1/15 showed norm heart size, lungs clear x mild scarring at left base, thor spondylosis, arthritis in right shoulder & left shoulder prosthesis  ATHEROSCLEROTIC HEART DISEASE (ICD-414.00) - on ASA 81mg Bid,  & IMDUR 60mg /d...  ~  1995:  Found to have a total RCA occlusion w/ collaterals... ~  2003:  He had a complex intervention w/ rotatioal atherectomy of his entire LAD system at mult  sites w/ 2 stents... ~  baseline EKG shows NSR, sm infer Q's, otherw NAD... ~  2DEcho 9/06 showed mild asymmetric LVH, septal thickening, mild infer HK, mild TR & mild plm HTN w/ RVsys est ~40 ~  NuclearStressTest 4/08 showed sm defect inferiorly from his known RCA occlusion, normal perfusion anteriorly and laterally where prev LAD rotational atherectomy and stent were done... ~  Repeat nuclear study 12/10 was the same> old scar, no ischemia, & EF= 58% ~  Nuclear stress Test 6/12 showed mod perfusion defect & perinfarct ischemia in inferior regions; EF= 58%; no change from prev... ~  8/14: he is followed by [facility] for Cards (note reviewed)> stable & they discussed f/u sleep study...  ~  He continues to f/u w/ Quentin Ore every 57mo and his notes are reviewed...  PERIPHERAL VASCULAR DISEASE (ICD-443.9) >> known right carotid disease followed by [facility] w/ Doppler Ultrasound exams... ~  Carotid duplex testing ~6/12 reported to show mod heterogen plaque w/ 50-69% right ICA stenosis, no change serially.  HYPERCHOLESTEROLEMIA (ICD-272.0) - DrKelly switched his Vytorin to CRESTOR 10mg /d, and he follows the labs. ~  [place] 3/09 from [facility] showed TChol 144, TG 122, HDL 58, LDL 63... ~  FLP 11/11 from [facility] showed TChol 141, TG 125, HDL 55, LDL 61 ~  FLP 5/13 from [facility] on [address] showed TChol 150, TG 169, HDL 47, LDL  69 ~  He continues to have labs monitored by [facility]...  GERD (ICD-530.81) - last EGD 3/07 by DrSam showed HH, ?stricture- dilated... ~  5/12:  Pt c/o incr GERD symptoms but was only using the Prilosec prn;  Given Nexium samples for 3weeks, then switch back to PRILOSEC 20mg /d...  DIVERTICULOSIS OF COLON (ICD-562.10) & COLONIC POLYPS (ICD-211.3) - last colonoscopy 3/07 showed divertics, hems, and 75mm polyp = tubular adenoma... f/u planned 22yrs...  DEGENERATIVE JOINT DISEASE (ICD-715.90) - he is s/p left total knee replacement surgery (DrCarter 4/03) and left shoulder arthroplasty (DrNorris  5/06) as well... ~  8/10:  He had full CSpine eval by DrBrooks at [place] w/ multilevel DDD found...  NON-HODGKIN'S LYMPHOMA (ICD-202.80) & THROMBOCYTOPENIA (ICD-287.5) - Eval and Rx by DrSherrill> on observation since 2004 & stable... ~  last CT Abd 5/08 w/ irreg calcified mesenteric lesion c/w treated lymphoma; calcif in AbdAo, divertics... ~  No change in this abn on follow up CT Chest/ Abd/ Pelvis 1/10 by oncology> ?incr adipose tissue in left breast area, atherosclerotic calcif in vessels, severe right shoulder arthritis; calcif mesenteric masses c/w treated lymphoma, renal cyst, lumbar spondy, etc... ~  12/14: he had f/u w/ DrSherrill (note reviewed)> feeling well, no adenopathy, s/p several skin cancers removed, he has stable mesenteric mass (last imaged 2010) & chr mild thrombocytopenia...  ~  He continues to f/u w/ DrSherrill once or twice yearly...  Health Maintenance -  full routine labs from [facility] 3/09 showed PSA= 0.94... ~  Hx skin cancer removed right side of nose via Moh's & required plastic surg from [facility], [place]...    Review of Systems Constitutional:   No  weight loss, night sweats,  Fevers, chills, fatigue, or  lassitude.  HEENT:   No headaches,  Difficulty swallowing,  Tooth/dental problems, or  Sore throat,                No sneezing, itching, ear ache, nasal congestion, post nasal drip,          Objective:   Physical Exam GEN: A/Ox3; pleasant , NAD, elderly   HEENT:  [place]/AT,  EACs-clear, TMs-wnl, NOSE-clear, along left upper eyebrow w/ well healed approximated lac -no redness or drainage noted.  Sutures intact.  Skin: Warm, no lesions or rashes, lac as above.    Procedure:  4 sutures removed without difficulty  Skin care          Assessment & Plan:

## 2014-06-13 NOTE — Assessment & Plan Note (Addendum)
Left eyebrow laceration s/p fall  Well healed with suture.  Sutures removed without difficulty   Plan  Skin care discussed  Watch area closely for redness or drainage.  Wash area gently with soap and water, pat dry  Please contact office for sooner follow up if symptoms do not improve or worsen or seek emergency care  [name] is retiring and we will need to set you up with a new Primary Care provider.  Please let us know if you would like [place] to assist with this referral.

## 2014-06-13 NOTE — Patient Instructions (Signed)
Watch area closely for redness or drainage.  Wash area gently with soap and water, pat dry  Please contact office for sooner follow up if symptoms do not improve or worsen or seek emergency care  [name] is retiring and we will need to set you up with a new Primary Care provider.  Please let us know if you would like [place] to assist with this referral.

## 2014-07-03 ENCOUNTER — Other Ambulatory Visit: Payer: Self-pay | Admitting: *Deleted

## 2014-07-03 DIAGNOSIS — Z79899 Other long term (current) drug therapy: Secondary | ICD-10-CM

## 2014-07-03 DIAGNOSIS — I10 Essential (primary) hypertension: Secondary | ICD-10-CM

## 2014-07-03 DIAGNOSIS — E785 Hyperlipidemia, unspecified: Secondary | ICD-10-CM

## 2014-07-06 ENCOUNTER — Telehealth: Payer: Self-pay | Admitting: Cardiovascular Disease

## 2014-07-06 DIAGNOSIS — Z125 Encounter for screening for malignant neoplasm of prostate: Secondary | ICD-10-CM

## 2014-07-06 NOTE — Telephone Encounter (Signed)
Spoke with pt, aware will place order for psa in the mail to him. Pt agreed with this plan.

## 2014-07-06 NOTE — Telephone Encounter (Signed)
Pt said he received his lab order in the mail. He said he asked you to order a PSA test,he said it was not on it.

## 2014-07-31 ENCOUNTER — Ambulatory Visit (INDEPENDENT_AMBULATORY_CARE_PROVIDER_SITE_OTHER): Payer: Medicare HMO | Admitting: Family Medicine

## 2014-07-31 ENCOUNTER — Encounter: Payer: Self-pay | Admitting: Family Medicine

## 2014-07-31 VITALS — BP 119/70 | HR 66 | Temp 98.2°F | Ht 70.0 in | Wt 178.0 lb

## 2014-07-31 DIAGNOSIS — I2510 Atherosclerotic heart disease of native coronary artery without angina pectoris: Secondary | ICD-10-CM

## 2014-07-31 DIAGNOSIS — G4730 Sleep apnea, unspecified: Secondary | ICD-10-CM

## 2014-07-31 DIAGNOSIS — C8589 Other specified types of non-Hodgkin lymphoma, extranodal and solid organ sites: Secondary | ICD-10-CM

## 2014-07-31 DIAGNOSIS — Z23 Encounter for immunization: Secondary | ICD-10-CM

## 2014-07-31 DIAGNOSIS — E7800 Pure hypercholesterolemia, unspecified: Secondary | ICD-10-CM

## 2014-07-31 DIAGNOSIS — I739 Peripheral vascular disease, unspecified: Secondary | ICD-10-CM

## 2014-07-31 DIAGNOSIS — D696 Thrombocytopenia, unspecified: Secondary | ICD-10-CM

## 2014-07-31 DIAGNOSIS — J309 Allergic rhinitis, unspecified: Secondary | ICD-10-CM

## 2014-07-31 DIAGNOSIS — K5730 Diverticulosis of large intestine without perforation or abscess without bleeding: Secondary | ICD-10-CM

## 2014-07-31 DIAGNOSIS — D126 Benign neoplasm of colon, unspecified: Secondary | ICD-10-CM

## 2014-07-31 DIAGNOSIS — M1990 Unspecified osteoarthritis, unspecified site: Secondary | ICD-10-CM

## 2014-07-31 DIAGNOSIS — I10 Essential (primary) hypertension: Secondary | ICD-10-CM

## 2014-07-31 DIAGNOSIS — C4491 Basal cell carcinoma of skin, unspecified: Secondary | ICD-10-CM

## 2014-07-31 NOTE — Progress Notes (Signed)
Derrick Reddish, MD Phone: [phone]  Subjective:  Patient presents today to establish care with me as their new primary care provider. Patient was formerly a patient of [name] of [facility]. Has cardiologist, dermatologist, and oncologist. History knee and shoulder replacement-[facility]. Occasionally gets sinus infection, does have intermittent runny nose.   Hypertension BP Readings from Last 3 Encounters:  07/31/14 119/70  06/13/14 112/76  05/31/14 145/120  Home BP monitoring-no Compliant with medications-yes without side effects, atenolol, lisinopril, imdur, verapamil.  ROS-Denies any CP, HA, SOB, blurry vision, changes in LE edema.   The following were reviewed and entered/updated in epic: Past Medical History  Diagnosis Date  . Allergic rhinitis   . HTN (hypertension)   . Coronary atherosclerosis of unspecified type of vessel, native or graft   . Peripheral vascular disease   . Hypercholesterolemia   . GERD (gastroesophageal reflux disease)   . Diverticulosis of colon   . History of colonic polyps   . DJD (degenerative joint disease)   . Non Hodgkin's lymphoma   . Thrombocytopenia   . Observed sleep apnea   . Sleep apnea 05-26-2010    sleep study on 05-26-2010  . DIVERTICULOSIS OF COLON 07/09/2008   Patient Active Problem List   Diagnosis Date Noted  . NON-HODGKIN'S LYMPHOMA 07/09/2008    Priority: High  . Coronary artery disease 06/26/2008    Priority: High  . THROMBOCYTOPENIA 07/09/2008    Priority: Medium  . HYPERCHOLESTEROLEMIA 06/26/2008    Priority: Medium  . HYPERTENSION 06/26/2008    Priority: Medium  . Basal cell carcinoma 07/31/2014    Priority: Low  . Laceration 06/13/2014    Priority: Low  . Observed sleep apnea     Priority: Low  . Herpes zoster 03/30/2012    Priority: Low  . ALLERGIC RHINITIS 06/27/2008    Priority: Low  . COLONIC POLYPS 06/26/2008    Priority: Low  . Chronic venous insufficiency 06/26/2008    Priority: Low  . GERD  06/26/2008    Priority: Low  . DEGENERATIVE JOINT DISEASE 06/26/2008    Priority: Low   Past Surgical History  Procedure Laterality Date  . Total knee arthroplasty  03-2002    Dr. Eulas Post, left  . Total shoulder arthroplasty  03-2005    Dr. Veverly Fells, left   . Cardiac catheterization    . Skin cancer removed  2013    removed from his nose-right side    Family History  Problem Relation Age of Onset  . Pancreatic cancer Mother   . Heart disease Father     Medications- reviewed and updated Current Outpatient Prescriptions  Medication Sig Dispense Refill  . Ascorbic Acid (VITAMIN C) 500 MG tablet Take 500 mg by mouth daily.        Marland Kitchen aspirin 81 MG tablet Take 81 mg by mouth daily. Take 4 tablets by mouth daily      . atenolol (TENORMIN) 50 MG tablet Take 0.5 tablets (25 mg total) by mouth 2 (two) times daily.  30 tablet  5  . B Complex-C-Calcium (B-COMPLEX/VITAMIN C) TABS Take one tablet by mouth every 3 days       . fish oil-omega-3 fatty acids 1000 MG capsule Take one capsule by mouth daily      . isosorbide mononitrate (IMDUR) 60 MG 24 hr tablet Take 1 tablet (60 mg total) by mouth daily.  90 tablet  3  . lisinopril (PRINIVIL,ZESTRIL) 10 MG tablet Take 1 tablet (10 mg total) by mouth daily.  90 tablet  3  . Multiple Vitamins-Minerals (MULTIVITAMIN WITH MINERALS) tablet Take one tablet by mouth every 3 days      . rosuvastatin (CRESTOR) 10 MG tablet Take 1 tablet (10 mg total) by mouth daily.  90 tablet  3  . verapamil (CALAN-SR) 240 MG CR tablet Take 1 tablet (240 mg total) by mouth at bedtime.  90 tablet  3  . [DISCONTINUED] ezetimibe-simvastatin (VYTORIN) 10-20 MG per tablet Take 1 tablet by mouth at bedtime.         No current facility-administered medications for this visit.    Allergies-reviewed and updated Allergies  Allergen Reactions  . Penicillins     Did not help with strep throat    History   Social History  . Marital Status: Married    Spouse Name: Turpin     Number of Children: 3  . Years of Education: N/A   Occupational History  . retired-sales Chief Financial Officer    Social History Main Topics  . Smoking status: Former Smoker -- 2.00 packs/day for 20 years    Types: Cigarettes    Quit date: 12/01/1966  . Smokeless tobacco: Never Used  . Alcohol Use: No     Comment: quit in 1954  . Drug Use: No  . Sexual Activity: None   Other Topics Concern  . None   Social History Narrative   Married 61 years in 2015. 3 kids. 5 grandkids.       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 Oropharynx normal. Still has many of his own teeth.  Neck: no thyromegaly, no lymphadenopathy CV: RRR no murmurs rubs or gallops Lungs: CTAB no crackles, wheeze, rhonchi Abdomen: soft/nontender/nondistended/normal bowel sounds.  Ext: trace edema Skin: warm, dry, multiple seborheic keratosis on scalp, right side of nose with previous surgical scar Neuro: grossly normal, moves all extremities, PERRLA  Assessment/Plan:  Updated problem list to reflect current treatments for each medical problem. Fortunately, patient has been very well managed with aid of cardiology, dermatology, and oncology. He has scheduled follow up with each. He is getting lab work with Dr. [name] of cardiology. I advised patient of potential risks of PSA testing but he would like to proceed forward. Consider DRE at future visit. Plan to review Dr. Evette Georges labs at next visit.   HYPERTENSION Well controlled on  atenolol, lisinopril, imdur, verapamil. Continue current medications.    Vaccines given: Orders Placed This Encounter  Procedures  . Pneumococcal polysaccharide vaccine 23-valent greater than or equal to 2yo subcutaneous/IM  . Flu Vaccine QUAD 36+ mos PF IM (Fluarix Quad PF)  Refuses zostavax.

## 2014-07-31 NOTE — Patient Instructions (Signed)
Things look great today.   Let me know if there are any concerns from the labs you have questions about or that Dr. [name] wants for me to talk to you about.   Flu shot today as well as pneumonia vaccine (final pneumonia vaccine). No more shots needed this winter.

## 2014-07-31 NOTE — Assessment & Plan Note (Signed)
Well controlled on atenolol, lisinopril, imdur, verapamil. Continue current medications.

## 2014-08-31 ENCOUNTER — Other Ambulatory Visit: Payer: Self-pay | Admitting: Cardiovascular Disease

## 2014-09-01 NOTE — Telephone Encounter (Signed)
Rx was sent to pharmacy electronically. 

## 2014-09-04 LAB — CBC
HCT: 45.6 % (ref 39.0–52.0)
Hemoglobin: 15.6 g/dL (ref 13.0–17.0)
MCH: 32.8 pg (ref 26.0–34.0)
MCHC: 34.2 g/dL (ref 30.0–36.0)
MCV: 95.8 fL (ref 78.0–100.0)
Platelets: 132 10*3/uL — ABNORMAL LOW (ref 150–400)
RBC: 4.76 MIL/uL (ref 4.22–5.81)
RDW: 13.7 % (ref 11.5–15.5)
WBC: 7.9 10*3/uL (ref 4.0–10.5)

## 2014-09-04 LAB — COMPREHENSIVE METABOLIC PANEL
ALT: 20 U/L (ref 0–53)
AST: 24 U/L (ref 0–37)
Albumin: 4.4 g/dL (ref 3.5–5.2)
Alkaline Phosphatase: 61 U/L (ref 39–117)
BILIRUBIN TOTAL: 0.7 mg/dL (ref 0.2–1.2)
BUN: 15 mg/dL (ref 6–23)
CHLORIDE: 107 meq/L (ref 96–112)
CO2: 28 meq/L (ref 19–32)
Calcium: 9.8 mg/dL (ref 8.4–10.5)
Creat: 1 mg/dL (ref 0.50–1.35)
Glucose, Bld: 89 mg/dL (ref 70–99)
Potassium: 5 mEq/L (ref 3.5–5.3)
SODIUM: 142 meq/L (ref 135–145)
TOTAL PROTEIN: 6.5 g/dL (ref 6.0–8.3)

## 2014-09-04 LAB — LIPID PANEL
CHOLESTEROL: 138 mg/dL (ref 0–200)
HDL: 53 mg/dL (ref >39)
LDL Cholesterol: 61 mg/dL (ref 0–99)
Total CHOL/HDL Ratio: 2.6 Ratio
Triglycerides: 119 mg/dL (ref <150)
VLDL: 24 mg/dL (ref 0–40)

## 2014-09-04 LAB — TSH: TSH: 2.727 u[IU]/mL (ref 0.350–4.500)

## 2014-09-05 LAB — PSA: PSA: 0.99 ng/mL (ref <=4.00)

## 2014-09-15 ENCOUNTER — Ambulatory Visit (INDEPENDENT_AMBULATORY_CARE_PROVIDER_SITE_OTHER): Payer: Medicare HMO | Admitting: Cardiovascular Disease

## 2014-09-15 ENCOUNTER — Encounter: Payer: Self-pay | Admitting: Cardiovascular Disease

## 2014-09-15 VITALS — BP 110/70 | HR 55 | Ht 70.0 in | Wt 179.2 lb

## 2014-09-15 DIAGNOSIS — E785 Hyperlipidemia, unspecified: Secondary | ICD-10-CM

## 2014-09-15 DIAGNOSIS — G4733 Obstructive sleep apnea (adult) (pediatric): Secondary | ICD-10-CM

## 2014-09-15 DIAGNOSIS — E7800 Pure hypercholesterolemia, unspecified: Secondary | ICD-10-CM

## 2014-09-15 DIAGNOSIS — I10 Essential (primary) hypertension: Secondary | ICD-10-CM

## 2014-09-15 DIAGNOSIS — I2510 Atherosclerotic heart disease of native coronary artery without angina pectoris: Secondary | ICD-10-CM

## 2014-09-15 MED ORDER — ATENOLOL 50 MG PO TABS: 25.0000 mg | ORAL_TABLET | Freq: Two times a day (BID) | ORAL | Status: DC

## 2014-09-15 MED ORDER — VERAPAMIL HCL 120 MG PO TABS: 120.0000 mg | ORAL_TABLET | Freq: Once | ORAL | Status: DC

## 2014-09-15 NOTE — Progress Notes (Signed)
Patient ID: Derrick Rivera, male   DOB: Feb 27, 1924, 80 y.o.   MRN: [id number]     HPI: Derrick Rivera is a 80 y.o. male who presents for a 30-month cardiology followup evaluation.  Derrick Rivera has established coronary artery disease and in 1995 he was found to have total occlusion of his right coronary artery. In 2003 he underwent complex Derrick-speed rotational atherectomy of his entire extremely calcified LAD system at multiple sites. At that time, I placed a 3.0x15 mm and 3.0x12 mm S7 stent in the proximal to mid region. A nuclear perfusion study in June 2012 showed an ejection fraction of 58% with moderate perfusion defect compatible with inferior scar with minimal peri-infarction ischemia concordant with his known mid RCA occlusion. He underwent a 2 year followup nuclear perfusion study on 07/06/2013 which was unchanged and showed an ejection fraction of 57% with mild inferolateral hypokinesis and a small inferolateral defect consistent with his prior abnormality. There was no significant reversible ischemia.  Additional problems include hypertension, hyperlipidemia, lymphoma followed by Dr. Ammie Rivera, and he is status Rivera Mohs surgery for skin cancer with reconstructive plastic surgery at [facility].  Over the last year he has remained relatively stable without chest pain symptoms. He does note some mild shortness of breath.  He denies palpitations.  However, he has had some difficulty with equilibrium, and dizziness.  He also notes some swelling in his right ankle.  He is now seeing Dr. Rushie Rivera for primary care since Dr. Leeanne Rivera is no longer available.  He also has noticed some mild  short-term memory loss.  Of note, he has been diagnosed on PSG to have moderate sleep apnea with an AHI of 27.  He never went for his CPAP titration and he does not use CPAP therapy.  He states he is sleeping well.  He wakes up, however, at least 2 times per night.  On a sleep study also was noted to have periodic  leg movement disorder during sleep.  He denies painful restless legs.  Past Medical History  Diagnosis Date  . Allergic rhinitis   . HTN (hypertension)   . Coronary atherosclerosis of unspecified type of vessel, native or graft   . Peripheral vascular disease   . Hypercholesterolemia   . GERD (gastroesophageal reflux disease)   . Diverticulosis of colon   . History of colonic polyps   . DJD (degenerative joint disease)   . Non Hodgkin's lymphoma   . Thrombocytopenia   . Observed sleep apnea   . Sleep apnea 05-26-2010    sleep study on 05-26-2010  . DIVERTICULOSIS OF COLON 07/09/2008    Past Surgical History  Procedure Laterality Date  . Total knee arthroplasty  03-2002    Dr. Eulas Rivera, left  . Total shoulder arthroplasty  03-2005    Dr. Veverly Rivera, left   . Cardiac catheterization    . Skin cancer removed  2013    removed from his nose-right side    Allergies  Allergen Reactions  . Penicillins     Did not help with strep throat    Current Outpatient Prescriptions  Medication Sig Dispense Refill  . Ascorbic Acid (VITAMIN C) 500 MG tablet Take 500 mg by mouth daily.        Marland Kitchen aspirin 81 MG tablet Take 81 mg by mouth daily. Take 4 tablets by mouth daily      . atenolol (TENORMIN) 50 MG tablet Take 0.5 tablets (25 mg total) by mouth 2 (two) times daily. MUST  KEEP APPOINTMENT 09/15/2014 FOR FUTURE REFILLS.  30 tablet  0  . B Complex-C-Calcium (B-COMPLEX/VITAMIN C) TABS Take one tablet by mouth every 3 days       . fish oil-omega-3 fatty acids 1000 MG capsule Take one capsule by mouth daily      . isosorbide mononitrate (IMDUR) 60 MG 24 hr tablet Take 1 tablet (60 mg total) by mouth daily.  90 tablet  3  . lisinopril (PRINIVIL,ZESTRIL) 10 MG tablet Take 1 tablet (10 mg total) by mouth daily.  90 tablet  3  . Multiple Vitamins-Minerals (MULTIVITAMIN WITH MINERALS) tablet Take one tablet by mouth every 3 days      . rosuvastatin (CRESTOR) 10 MG tablet Take 1 tablet (10 mg total) by mouth  daily.  90 tablet  3  . verapamil (CALAN-SR) 240 MG CR tablet Take 1 tablet (240 mg total) by mouth at bedtime.  90 tablet  3  . [DISCONTINUED] ezetimibe-simvastatin (VYTORIN) 10-20 MG per tablet Take 1 tablet by mouth at bedtime.         No current facility-administered medications for this visit.    History   Social History  . Marital Status: Married    Spouse Name: Derrick Rivera    Number of Children: 3  . Years of Education: N/A   Occupational History  . retired-sales Chief Financial Officer    Social History Main Topics  . Smoking status: Former Smoker -- 2.00 packs/day for 20 years    Types: Cigarettes    Quit date: 12/01/1966  . Smokeless tobacco: Never Used  . Alcohol Use: No     Comment: quit in 1954  . Drug Use: No  . Sexual Activity: Not on file   Other Topics Concern  . Not on file   Social History Narrative   Married 61 years in 2015. 3 kids. 5 grandkids.       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 Pancreatic cancer Mother   . Heart disease Father    Social history is notable that he is married has 3 children 5 grandchildren. There is a remote tobacco history but he quit over 40 years ago.   ROS General: Negative; No fevers, chills, or night sweats;  HEENT: Negative; No changes in vision or hearing, sinus congestion, difficulty swallowing Pulmonary: Negative; No cough, wheezing, shortness of breath, hemoptysis Cardiovascular: Negative; No chest pain, presyncope, syncope, palpitations GI: Negative; No nausea, vomiting, diarrhea, or abdominal pain GU: Negative; No dysuria, hematuria, or difficulty voiding Musculoskeletal: Negative; no myalgias, joint pain, or weakness Hematologic/Oncology: Status  Rivera plastic surgery for skin was cancer with scarring of his nose.  History of lymphoma; no easy bruising, bleeding Endocrine: Negative; no heat/cold intolerance; no diabetes Neuro: Negative; no changes in balance, headaches Skin: Negative; No rashes or skin lesions Psychiatric: Negative; No behavioral problems, depression Sleep: Positive for obstructive sleep apnea and periodic limb movement disorder.  No hypnogognic hallucinations, no cataplexy Other comprehensive 14 point system review is negative. PE BP 110/70  Pulse 55  Ht 5\' 10"  (1.778 m)  Wt 179 lb 3.2 oz (81.285 kg)  BMI 25.71 kg/m2  General: Alert, oriented, no distress.  Skin: normal turgor, no  rashes HEENT: Normocephalic, atraumatic. Pupils round and reactive; sclera anicteric;no lid lag.  Nose without nasal septal hypertrophy, scarring secondary to prior nasal surgery. Mouth/Parynx benign; Mallinpatti scale 3 Neck: No JVD, no carotid bruits  Lungs: clear to ausculatation and percussion; no wheezing or rales Chest wall: Nontender to palpation Heart: RRR, s1 s2 normal 1/6 systolic murmur; no diastolic murmur.  No S3 or S4 gallop.  No rubs, thrills, or cc Abdomen: Mild diastases recti. No renal artery bruits. soft, nontender; no hepatosplenomehaly, BS+; abdominal aorta nontender and not dilated by palpation. Back: No CVA tenderness Pulses 2+ Extremities: Mild right ankle swelling: no clubbing cyanosis, Homan's sign negative  Neurologic: grossly nonfocal Psychological: Normal affect and mood   ECG (independently read by me): Sinus bradycardia 55 beats per minute.  Borderline first degree AV block with a PR interval of 202 ms.  No significant ST-T changes. Prior August 2014 ECG: Sinus rhythm at 68 beats per minute. Intervals normal.  LABS:  BMET    Component Value Date/Time   NA 142 09/04/2014 0844   K 5.0 09/04/2014 0844   CL 107 09/04/2014 0844   CO2 28 09/04/2014 0844   GLUCOSE 89 09/04/2014 0844   BUN 15 09/04/2014  0844   CREATININE 1.00 09/04/2014 0844   CREATININE 1.1 04/27/2007 0816   CALCIUM 9.8 09/04/2014 0844   GFRNONAA 68 04/27/2007 0816   GFRAA 83 04/27/2007 0816     Hepatic Function Panel     Component Value Date/Time   PROT 6.5 09/04/2014 0844   ALBUMIN 4.4 09/04/2014 0844   AST 24 09/04/2014 0844   ALT 20 09/04/2014 0844   ALKPHOS 61 09/04/2014 0844   BILITOT 0.7 09/04/2014 0844   BILIDIR 0.2 04/27/2007 0816     CBC    Component Value Date/Time   WBC 7.9 09/04/2014 0844   WBC 8.4 11/03/2013 0932   RBC 4.76 09/04/2014 0844   RBC 4.65 11/03/2013 0932   HGB 15.6 09/04/2014 0844   HGB 15.6 11/03/2013 0932   HCT 45.6 09/04/2014 0844   HCT 45.9 11/03/2013 0932   PLT 132* 09/04/2014 0844   PLT 122* 11/03/2013 0932   MCV 95.8 09/04/2014 0844   MCV 98.7* 11/03/2013 0932   MCH 32.8 09/04/2014 0844   MCH 33.6* 11/03/2013 0932   MCHC 34.2 09/04/2014 0844   MCHC 34.1 11/03/2013 0932   RDW 13.7 09/04/2014 0844   RDW 13.8 11/03/2013 0932   LYMPHSABS 1.9 11/03/2013 0932   MONOABS 0.8 11/03/2013 0932   MONOABS 0.6 04/27/2007 0816   EOSABS 0.3 11/03/2013 0932   EOSABS 0.3 04/27/2007 0816   BASOSABS 0.0 11/03/2013 0932   BASOSABS 0.0 04/27/2007 0816     BNP No results found for this basename: probnp    Lipid Panel     Component Value Date/Time   CHOL 138 09/04/2014 0844   TRIG 119 09/04/2014 0844   HDL 53 09/04/2014 0844   CHOLHDL 2.6 09/04/2014 0844   VLDL 24 09/04/2014 0844   LDLCALC 61 09/04/2014 0844     RADIOLOGY: No results found.    ASSESSMENT AND PLAN: Mr. Borquez has known RCA occlusion by initial catheterization in 1995. He is now 12 years status Rivera complex Derrick-speed rotational appendectomy and stenting of his proximal and mid LAD. His last nuclear study in 2014 was unchanged from 2 years previously and showed an area of small inferolateral scar. No significant ischemia was demonstrated on the present study.  He has developed some equilibrium issues and  also set some difficulty with  dizziness.  He was not orthostatic on exam today.  He does have sinus bradycardia, 55 beats per minute.  Presently, he is taking atenolol 25 mg twice a day, as well as Verapamil SR 240 mg daily in addition to his lisinopril 10 mg for blood pressure control.  I recommended he decrease his verapamil to 120 mg and take this at bedtime.  He is on Crestor for hyperlipidemia.  He is not having any anginal symptoms and continues to tolerate low dose isosorbide mononitrate.  I discussed his sleep apnea.  He feels he is sleeping well and at this point does not wish to pursue CPAP titration.  I will see him in one year for cardiology reevaluation.     Troy Sine, MD, [facility]  09/15/2014 9:55 AM

## 2014-09-15 NOTE — Patient Instructions (Signed)
Your physician has recommended you make the following change in your medication: decrease the verarapramil (calan) down from 240 mg daily to 120 mg daily. Take as directed per Dr. [name] 's instructions until you have used up the 240 mg capsules.  Your physician wants you to follow-up in:1 year or sooner. You will receive a reminder letter in the mail two months in advance. If you don't receive a letter, please call our office to schedule the follow-up appointment.

## 2014-09-16 ENCOUNTER — Encounter: Payer: Self-pay | Admitting: Cardiovascular Disease

## 2014-09-16 DIAGNOSIS — G4733 Obstructive sleep apnea (adult) (pediatric): Secondary | ICD-10-CM | POA: Insufficient documentation

## 2014-09-16 DIAGNOSIS — E785 Hyperlipidemia, unspecified: Secondary | ICD-10-CM | POA: Insufficient documentation

## 2014-10-31 ENCOUNTER — Telehealth: Payer: Self-pay | Admitting: Oncology

## 2014-10-31 ENCOUNTER — Ambulatory Visit (HOSPITAL_BASED_OUTPATIENT_CLINIC_OR_DEPARTMENT_OTHER): Payer: Medicare HMO | Admitting: Oncology

## 2014-10-31 ENCOUNTER — Other Ambulatory Visit (HOSPITAL_BASED_OUTPATIENT_CLINIC_OR_DEPARTMENT_OTHER): Payer: Medicare HMO

## 2014-10-31 VITALS — BP 141/74 | HR 70 | Temp 98.2°F | Resp 18 | Ht 70.0 in | Wt 178.4 lb

## 2014-10-31 DIAGNOSIS — Z8579 Personal history of other malignant neoplasms of lymphoid, hematopoietic and related tissues: Secondary | ICD-10-CM

## 2014-10-31 DIAGNOSIS — Z85828 Personal history of other malignant neoplasm of skin: Secondary | ICD-10-CM

## 2014-10-31 DIAGNOSIS — C8599 Non-Hodgkin lymphoma, unspecified, extranodal and solid organ sites: Secondary | ICD-10-CM

## 2014-10-31 DIAGNOSIS — D696 Thrombocytopenia, unspecified: Secondary | ICD-10-CM

## 2014-10-31 DIAGNOSIS — M542 Cervicalgia: Secondary | ICD-10-CM

## 2014-10-31 LAB — CBC WITH DIFFERENTIAL/PLATELET
BASO%: 0.1 % (ref 0.0–2.0)
Basophils Absolute: 0 10*3/uL (ref 0.0–0.1)
EOS%: 3.7 % (ref 0.0–7.0)
Eosinophils Absolute: 0.3 10*3/uL (ref 0.0–0.5)
HEMATOCRIT: 45.7 % (ref 38.4–49.9)
HGB: 16.1 g/dL (ref 13.0–17.1)
LYMPH#: 2.4 10*3/uL (ref 0.9–3.3)
LYMPH%: 30.6 % (ref 14.0–49.0)
MCH: 33.1 pg (ref 27.2–33.4)
MCHC: 35.2 g/dL (ref 32.0–36.0)
MCV: 93.8 fL (ref 79.3–98.0)
MONO#: 0.7 10*3/uL (ref 0.1–0.9)
MONO%: 8.5 % (ref 0.0–14.0)
NEUT#: 4.5 10*3/uL (ref 1.5–6.5)
NEUT%: 57.1 % (ref 39.0–75.0)
NRBC: 0 % (ref 0–0)
Platelets: 122 10*3/uL — ABNORMAL LOW (ref 140–400)
RBC: 4.87 10*6/uL (ref 4.20–5.82)
RDW: 12.9 % (ref 11.0–14.6)
WBC: 7.8 10*3/uL (ref 4.0–10.3)

## 2014-10-31 NOTE — Telephone Encounter (Signed)
gv and printed appt sched and avs for pt for Dec 2016 °

## 2014-10-31 NOTE — Progress Notes (Signed)
  [place] OFFICE PROGRESS NOTE   Diagnosis: Non-Hodgkin's lymphoma  INTERVAL HISTORY:   Derrick Rivera returns as scheduled. He feels well in general. No palpable lymph nodes. Good appetite. He has noted lighter colored stool recently. He is up-to-date on the influenza and pneumonia vaccines.  Objective:  Vital signs in last 24 hours:  Blood pressure 141/74, pulse 70, temperature 98.2 F (36.8 C), temperature source Oral, resp. rate 18, height 5\' 10"  (1.778 m), weight 178 lb 6.4 oz (80.922 kg).    HEENT: Neck without mass Lymphatics: No cervical, supraclavicular, axillary, or inguinal nodes Resp: Lungs clear bilaterally Cardio: Regular rate and rhythm GI: No hepatomegaly, no mass. Tender in the supra umbilical area. Vascular: No leg edema   Lab Results:  Lab Results  Component Value Date   WBC 7.8 10/31/2014   HGB 16.1 10/31/2014   HCT 45.7 10/31/2014   MCV 93.8 10/31/2014   PLT 122* 10/31/2014   NEUTROABS 4.5 10/31/2014    Medications: I have reviewed the patient's current medications.  Assessment/Plan: 1. Non-Hodgkin lymphoma diagnosed in 1996. He has been maintained off of specific therapy since 2000. 1. New lymph nodes in the right neck on exam 08/23/2009. 2. Restaging CT evaluation 12/21/2008 revealed a stable mesenteric mass and no evidence for progression of lymphoma. 2. Chronic mild thrombocytopenia. 3. "Mohs" surgery for treatment of a skin cancer at the right side of the nose.  1. He has undergone surgery for a local recurrence of the basal cell carcinoma with repeat reconstruction procedures 4. Chronic neck pain.   Disposition:  He remains in clinical remission from non-Hodgkin's lymphoma. He would like to [facility] follow[facility]. Derrick Rivera will return for an office visit in one year. He will contact us in the interim for new symptoms.  Betsy Coder, MD  10/31/2014  10:01 AM

## 2014-11-15 ENCOUNTER — Other Ambulatory Visit: Payer: Self-pay | Admitting: Cardiovascular Disease

## 2014-11-16 NOTE — Telephone Encounter (Signed)
Rx was sent to pharmacy electronically. 

## 2014-12-05 ENCOUNTER — Telehealth: Payer: Self-pay | Admitting: Cardiovascular Disease

## 2014-12-05 MED ORDER — ATORVASTATIN CALCIUM 20 MG PO TABS: 20.0000 mg | ORAL_TABLET | Freq: Every day | ORAL | Status: DC

## 2014-12-05 NOTE — Telephone Encounter (Signed)
Pt called stating that due to plan changes, his cost at the pharmacy has increased x3 for the Crestor.  He would like to be switched to a less expensive statin if Dr. [name] agrees.  Patient is taking Crestor 10mg  daily.

## 2014-12-05 NOTE — Telephone Encounter (Signed)
Can try atorvastatin 20 mg in place of crestor 10 mg.

## 2014-12-05 NOTE — Telephone Encounter (Signed)
Ordered for atorvastatin 20mg , 90 day supply. Left message for pt that Rx available at preferred pharmacy [facility]) and to contact us back w/ any questions.

## 2014-12-05 NOTE — Telephone Encounter (Signed)
Pt need a new prescription for Crestor. Pt says he needs to talk to you before prescription is filled.

## 2015-02-19 ENCOUNTER — Other Ambulatory Visit: Payer: Self-pay | Admitting: Cardiovascular Disease

## 2015-02-19 MED ORDER — ISOSORBIDE MONONITRATE ER 60 MG PO TB24: 60.0000 mg | ORAL_TABLET | Freq: Every day | ORAL | Status: DC

## 2015-02-19 MED ORDER — LISINOPRIL 10 MG PO TABS: 10.0000 mg | ORAL_TABLET | Freq: Every day | ORAL | Status: DC

## 2015-02-19 NOTE — Telephone Encounter (Signed)
Rx(s) sent to pharmacy electronically. Patient notified. 

## 2015-02-19 NOTE — Telephone Encounter (Signed)
°  1. Which medications need to be refilled? Isosorbide, Lisinopril  2. Which pharmacy is medication to be sent to?[facility] in [place]   3. Do they need a 30 day or 90 day supply? 90  4. Would they like a call back once the medication has been sent to the pharmacy? yes

## 2015-03-09 ENCOUNTER — Encounter: Payer: Self-pay | Admitting: Adult Health

## 2015-03-09 ENCOUNTER — Ambulatory Visit (INDEPENDENT_AMBULATORY_CARE_PROVIDER_SITE_OTHER): Payer: Medicare HMO | Admitting: Adult Health

## 2015-03-09 VITALS — BP 110/72 | HR 78 | Temp 98.1°F | Wt 177.0 lb

## 2015-03-09 DIAGNOSIS — J069 Acute upper respiratory infection, unspecified: Secondary | ICD-10-CM | POA: Diagnosis not present

## 2015-03-09 MED ORDER — DOXYCYCLINE HYCLATE 100 MG PO CAPS: 100.0000 mg | ORAL_CAPSULE | Freq: Two times a day (BID) | ORAL | Status: DC

## 2015-03-09 NOTE — Patient Instructions (Addendum)
I sent in a prescription for Doxycycline. Take this medication twice a day for seven days. Continue to use your delsym as needed and also use your nasal sprays at home. If you are not feeling better by Monday, please give me a call. If your symptoms worsen or you have trouble breathing and have a fever, please go to the ER. Upper Respiratory Infection, Adult An upper respiratory infection (URI) is also sometimes known as the common cold. The upper respiratory tract includes the nose, sinuses, throat, trachea, and bronchi. Bronchi are the airways leading to the lungs. Most people improve within 1 week, but symptoms can last up to 2 weeks. A residual cough may last even longer.  CAUSES Many different viruses can infect the tissues lining the upper respiratory tract. The tissues become irritated and inflamed and often become very moist. Mucus production is also common. A cold is contagious. You can easily spread the virus to others by oral contact. This includes kissing, sharing a glass, coughing, or sneezing. Touching your mouth or nose and then touching a surface, which is then touched by another person, can also spread the virus. SYMPTOMS  Symptoms typically develop 1 to 3 days after you come in contact with a cold virus. Symptoms vary from person to person. They may include:  Runny nose.  Sneezing.  Nasal congestion.  Sinus irritation.  Sore throat.  Loss of voice (laryngitis).  Cough.  Fatigue.  Muscle aches.  Loss of appetite.  Headache.  Low-grade fever. DIAGNOSIS  You might diagnose your own cold based on familiar symptoms, since most people get a cold 2 to 3 times a year. Your caregiver can confirm this based on your exam. Most importantly, your caregiver can check that your symptoms are not due to another disease such as strep throat, sinusitis, pneumonia, asthma, or epiglottitis. Blood tests, throat tests, and X-rays are not necessary to diagnose a common cold, but they may  sometimes be helpful in excluding other more serious diseases. Your caregiver will decide if any further tests are required. RISKS AND COMPLICATIONS  You may be at risk for a more severe case of the common cold if you smoke cigarettes, have chronic heart disease (such as heart failure) or lung disease (such as asthma), or if you have a weakened immune system. The very young and very old are also at risk for more serious infections. Bacterial sinusitis, middle ear infections, and bacterial pneumonia can complicate the common cold. The common cold can worsen asthma and chronic obstructive pulmonary disease (COPD). Sometimes, these complications can require emergency medical care and may be life-threatening. PREVENTION  The best way to protect against getting a cold is to practice good hygiene. Avoid oral or hand contact with people with cold symptoms. Wash your hands often if contact occurs. There is no clear evidence that vitamin C, vitamin E, echinacea, or exercise reduces the chance of developing a cold. However, it is always recommended to get plenty of rest and practice good nutrition. TREATMENT  Treatment is directed at relieving symptoms. There is no cure. Antibiotics are not effective, because the infection is caused by a virus, not by bacteria. Treatment may include:  Increased fluid intake. Sports drinks offer valuable electrolytes, sugars, and fluids.  Breathing heated mist or steam (vaporizer or shower).  Eating chicken soup or other clear broths, and maintaining good nutrition.  Getting plenty of rest.  Using gargles or lozenges for comfort.  Controlling fevers with ibuprofen or acetaminophen as directed by your  caregiver.  Increasing usage of your inhaler if you have asthma. Zinc gel and zinc lozenges, taken in the first 24 hours of the common cold, can shorten the duration and lessen the severity of symptoms. Pain medicines may help with fever, muscle aches, and throat pain. A  variety of non-prescription medicines are available to treat congestion and runny nose. Your caregiver can make recommendations and may suggest nasal or lung inhalers for other symptoms.  HOME CARE INSTRUCTIONS   Only take over-the-counter or prescription medicines for pain, discomfort, or fever as directed by your caregiver.  Use a warm mist humidifier or inhale steam from a shower to increase air moisture. This may keep secretions moist and make it easier to breathe.  Drink enough water and fluids to keep your urine clear or pale yellow.  Rest as needed.  Return to work when your temperature has returned to normal or as your caregiver advises. You may need to stay home longer to avoid infecting others. You can also use a face mask and careful hand washing to prevent spread of the virus. SEEK MEDICAL CARE IF:   After the first few days, you feel you are getting worse rather than better.  You need your caregiver's advice about medicines to control symptoms.  You develop chills, worsening shortness of breath, or brown or red sputum. These may be signs of pneumonia.  You develop yellow or brown nasal discharge or pain in the face, especially when you bend forward. These may be signs of sinusitis.  You develop a fever, swollen neck glands, pain with swallowing, or white areas in the back of your throat. These may be signs of strep throat. SEEK IMMEDIATE MEDICAL CARE IF:   You have a fever.  You develop severe or persistent headache, ear pain, sinus pain, or chest pain.  You develop wheezing, a prolonged cough, cough up blood, or have a change in your usual mucus (if you have chronic lung disease).  You develop sore muscles or a stiff neck. Document Released: 05/13/2001 Document Revised: 02/09/2012 Document Reviewed: 02/22/2014 [facility] Patient Information 2015 [place], [place]. This information is not intended to replace advice given to you by your health care provider. Make sure you  discuss any questions you have with your health care provider. Upper Respiratory Infection, Adult An upper respiratory infection (URI) is also sometimes known as the common cold. The upper respiratory tract includes the nose, sinuses, throat, trachea, and bronchi. Bronchi are the airways leading to the lungs. Most people improve within 1 week, but symptoms can last up to 2 weeks. A residual cough may last even longer.  CAUSES Many different viruses can infect the tissues lining the upper respiratory tract. The tissues become irritated and inflamed and often become very moist. Mucus production is also common. A cold is contagious. You can easily spread the virus to others by oral contact. This includes kissing, sharing a glass, coughing, or sneezing. Touching your mouth or nose and then touching a surface, which is then touched by another person, can also spread the virus. SYMPTOMS  Symptoms typically develop 1 to 3 days after you come in contact with a cold virus. Symptoms vary from person to person. They may include:  Runny nose.  Sneezing.  Nasal congestion.  Sinus irritation.  Sore throat.  Loss of voice (laryngitis).  Cough.  Fatigue.  Muscle aches.  Loss of appetite.  Headache.  Low-grade fever. DIAGNOSIS  You might diagnose your own cold based on familiar symptoms, since  most people get a cold 2 to 3 times a year. Your caregiver can confirm this based on your exam. Most importantly, your caregiver can check that your symptoms are not due to another disease such as strep throat, sinusitis, pneumonia, asthma, or epiglottitis. Blood tests, throat tests, and X-rays are not necessary to diagnose a common cold, but they may sometimes be helpful in excluding other more serious diseases. Your caregiver will decide if any further tests are required. RISKS AND COMPLICATIONS  You may be at risk for a more severe case of the common cold if you smoke cigarettes, have chronic heart disease  (such as heart failure) or lung disease (such as asthma), or if you have a weakened immune system. The very young and very old are also at risk for more serious infections. Bacterial sinusitis, middle ear infections, and bacterial pneumonia can complicate the common cold. The common cold can worsen asthma and chronic obstructive pulmonary disease (COPD). Sometimes, these complications can require emergency medical care and may be life-threatening. PREVENTION  The best way to protect against getting a cold is to practice good hygiene. Avoid oral or hand contact with people with cold symptoms. Wash your hands often if contact occurs. There is no clear evidence that vitamin C, vitamin E, echinacea, or exercise reduces the chance of developing a cold. However, it is always recommended to get plenty of rest and practice good nutrition. TREATMENT  Treatment is directed at relieving symptoms. There is no cure. Antibiotics are not effective, because the infection is caused by a virus, not by bacteria. Treatment may include:  Increased fluid intake. Sports drinks offer valuable electrolytes, sugars, and fluids.  Breathing heated mist or steam (vaporizer or shower).  Eating chicken soup or other clear broths, and maintaining good nutrition.  Getting plenty of rest.  Using gargles or lozenges for comfort.  Controlling fevers with ibuprofen or acetaminophen as directed by your caregiver.  Increasing usage of your inhaler if you have asthma. Zinc gel and zinc lozenges, taken in the first 24 hours of the common cold, can shorten the duration and lessen the severity of symptoms. Pain medicines may help with fever, muscle aches, and throat pain. A variety of non-prescription medicines are available to treat congestion and runny nose. Your caregiver can make recommendations and may suggest nasal or lung inhalers for other symptoms.  HOME CARE INSTRUCTIONS   Only take over-the-counter or prescription medicines  for pain, discomfort, or fever as directed by your caregiver.  Use a warm mist humidifier or inhale steam from a shower to increase air moisture. This may keep secretions moist and make it easier to breathe.  Drink enough water and fluids to keep your urine clear or pale yellow.  Rest as needed.  Return to work when your temperature has returned to normal or as your caregiver advises. You may need to stay home longer to avoid infecting others. You can also use a face mask and careful hand washing to prevent spread of the virus. SEEK MEDICAL CARE IF:   After the first few days, you feel you are getting worse rather than better.  You need your caregiver's advice about medicines to control symptoms.  You develop chills, worsening shortness of breath, or brown or red sputum. These may be signs of pneumonia.  You develop yellow or brown nasal discharge or pain in the face, especially when you bend forward. These may be signs of sinusitis.  You develop a fever, swollen neck glands, pain with  swallowing, or white areas in the back of your throat. These may be signs of strep throat. SEEK IMMEDIATE MEDICAL CARE IF:   You have a fever.  You develop severe or persistent headache, ear pain, sinus pain, or chest pain.  You develop wheezing, a prolonged cough, cough up blood, or have a change in your usual mucus (if you have chronic lung disease).  You develop sore muscles or a stiff neck. Document Released: 05/13/2001 Document Revised: 02/09/2012 Document Reviewed: 02/22/2014 [facility] Patient Information 2015 [place], [place]. This information is not intended to replace advice given to you by your health care provider. Make sure you discuss any questions you have with your health care provider.

## 2015-03-09 NOTE — Progress Notes (Signed)
Pre visit review using our clinic review tool, if applicable. No additional management support is needed unless otherwise documented below in the visit note. 

## 2015-03-09 NOTE — Progress Notes (Signed)
   Subjective:    Patient ID: Derrick Rivera, male    DOB: 23-Jan-1924, 81 y.o.   MRN: [id number]  HPI  Pleasant 81 year old gentleman who presents to the clinic today for URI type symptoms. He endorses nasal discharge, sinus pressure, PND as well as a cough with pale yellow mucus. This has been going on for about 4 -5 days. It happens every year and he gets an antibiotic for it - per patient. Denies fever, N/V/D.   Review of Systems  Constitutional: Negative for fever, activity change, appetite change, fatigue and unexpected weight change.  HENT: Positive for congestion, postnasal drip, rhinorrhea, sinus pressure and voice change. Negative for ear discharge, ear pain and hearing loss.   Eyes: Negative for pain, discharge and itching.  Respiratory: Positive for cough and shortness of breath. Negative for wheezing.         Objective:   Physical Exam  Constitutional: He appears well-developed and well-nourished. No distress.  HENT:  Right Ear: External ear normal.  Left Ear: External ear normal.  Mouth/Throat: Oropharynx is clear and moist. No oropharyngeal exudate.  hoarsness  Eyes: Right eye exhibits no discharge. Left eye exhibits no discharge.  Cardiovascular: Normal rate, regular rhythm and normal heart sounds.  Exam reveals no friction rub.   No murmur heard. Pulmonary/Chest: Effort normal and breath sounds normal. No respiratory distress. He has no wheezes. He has no rales.  Lymphadenopathy:    He has no cervical adenopathy.  Neurological: He is alert.  Skin: Skin is warm and dry.       Assessment & Plan:   Due to patient's age and other Rivera-morbidities,prescribed Doxycycline 100mg  BID x 7 days for sinusitis and acute exacerbation of bronchitis.  - Advised to follow up on Monday if no improvement noted.  - advised to go to the ER if he gets increasingly short of breath or has a fever greater than 101.

## 2015-06-29 ENCOUNTER — Telehealth: Payer: Self-pay | Admitting: Cardiovascular Disease

## 2015-06-29 DIAGNOSIS — I10 Essential (primary) hypertension: Secondary | ICD-10-CM

## 2015-06-29 DIAGNOSIS — Z79899 Other long term (current) drug therapy: Secondary | ICD-10-CM

## 2015-06-29 DIAGNOSIS — E785 Hyperlipidemia, unspecified: Secondary | ICD-10-CM

## 2015-06-29 DIAGNOSIS — Z125 Encounter for screening for malignant neoplasm of prostate: Secondary | ICD-10-CM

## 2015-06-29 NOTE — Telephone Encounter (Signed)
Derrick Rivera is asking that a lab order be mailed to him and if a PSA could be ordered . Appt is 09/25/15 at 11am .. Thanks

## 2015-06-29 NOTE — Telephone Encounter (Addendum)
Called patient. Has upcoming appt in October - wanted lab orders sent to him in advance of appt.  CBC, CMET, Lipid panel, TSH & PSA checked in October 2015  Sending to Dr. [name] to verify OK to repeat PSA w/ other tests.   ----- Pt prefers [facility] for draw site.

## 2015-06-30 NOTE — Telephone Encounter (Signed)
agree

## 2015-07-09 NOTE — Telephone Encounter (Signed)
Letter sent with lab orders

## 2015-08-09 ENCOUNTER — Other Ambulatory Visit: Payer: Self-pay | Admitting: Cardiovascular Disease

## 2015-08-10 ENCOUNTER — Ambulatory Visit (INDEPENDENT_AMBULATORY_CARE_PROVIDER_SITE_OTHER): Payer: Medicare HMO | Admitting: Family Medicine

## 2015-08-10 ENCOUNTER — Encounter: Payer: Self-pay | Admitting: Family Medicine

## 2015-08-10 VITALS — BP 120/70 | HR 65 | Temp 98.4°F | Ht 69.0 in | Wt 178.0 lb

## 2015-08-10 DIAGNOSIS — D126 Benign neoplasm of colon, unspecified: Secondary | ICD-10-CM

## 2015-08-10 DIAGNOSIS — Z23 Encounter for immunization: Secondary | ICD-10-CM

## 2015-08-10 DIAGNOSIS — Z0000 Encounter for general adult medical examination without abnormal findings: Secondary | ICD-10-CM | POA: Diagnosis not present

## 2015-08-10 DIAGNOSIS — E785 Hyperlipidemia, unspecified: Secondary | ICD-10-CM

## 2015-08-10 DIAGNOSIS — N402 Nodular prostate without lower urinary tract symptoms: Secondary | ICD-10-CM | POA: Insufficient documentation

## 2015-08-10 DIAGNOSIS — I10 Essential (primary) hypertension: Secondary | ICD-10-CM

## 2015-08-10 NOTE — Assessment & Plan Note (Addendum)
S: Extensive counseling provided about low yield of prostate cancer screening even if prostate cancer was found at his age. Patient still wanted to proceed.  A/P: Rectal exam shows a small nodule in midportion of prostate. We opted to correlate this with PSA and only refer to urology if PSA significantly elevated, otherwise repeat in 1 year- recognizing this could be an early prostate cancer but likely low yield further workup even if it was at patient's age. Patient could also request oncologists opinion on this matter.

## 2015-08-10 NOTE — Assessment & Plan Note (Signed)
S: complains of sinus drainage in back of throat and "old man runny nose". Minimal watery itch eyes A/P: trial flonase or Nasacort OTC for at least 3 weeks

## 2015-08-10 NOTE — Patient Instructions (Addendum)
For drainage- I would try flonase or nasocort or similar non name brand product that you can get over the counter. See me back if you use consistently for over 3 weeks and still have issues  Small nodule in middle of prostate- Would want to correlate this with a PSA. Would hold off on urology unless there is a significant increase in PSA, otherwise repeat in 1 year.   No blood in stools from rectal exam  See you in a year unless you need [place] sooner  Thanks for getting flu shot

## 2015-08-10 NOTE — Assessment & Plan Note (Signed)
S: controlled.  BP Readings from Last 3 Encounters:  08/10/15 120/70  03/09/15 110/72  10/31/14 141/74  A/P:Continue current meds:  Atenolol, lisinopril, imdur, verapamil.

## 2015-08-10 NOTE — Assessment & Plan Note (Signed)
Reviewed last eval in 2007- stated consider FOBT. Did have adenoma at that time. FOBT negative today

## 2015-08-10 NOTE — Assessment & Plan Note (Signed)
S: Last LDL <70 at goal on crestor 10mg  Lab Results  Component Value Date   CHOL 138 09/04/2014   HDL 53 09/04/2014   LDLCALC 61 09/04/2014   TRIG 119 09/04/2014   CHOLHDL 2.6 09/04/2014  A/P: continue crestor

## 2015-08-10 NOTE — Progress Notes (Signed)
[name], MD Phone: [phone]  Subjective:  Patient presents today for their annual physical. Chief complaint-noted.   Doing well. Following with cards, oncology, and dermatology  ROS- full  review of systems was completed and negative except for: does have some nocturia, weak strema. No chest pain, shortness of breath even with 10 minutes of biking  The following were reviewed and entered/updated in epic: Past Medical History  Diagnosis Date  . Allergic rhinitis   . HTN (hypertension)   . Coronary atherosclerosis of unspecified type of vessel, native or graft   . Peripheral vascular disease   . Hypercholesterolemia   . GERD (gastroesophageal reflux disease)   . Diverticulosis of colon   . History of colonic polyps   . DJD (degenerative joint disease)   . Non Hodgkin's lymphoma   . Thrombocytopenia   . Observed sleep apnea   . Sleep apnea 05-26-2010    sleep study on 05-26-2010  . DIVERTICULOSIS OF COLON 07/09/2008   Patient Active Problem List   Diagnosis Date Noted  . Prostate nodule 08/10/2015    Priority: High  . NON-HODGKIN'S LYMPHOMA 07/09/2008    Priority: High  . Coronary artery disease 06/26/2008    Priority: High  . Obstructive sleep apnea hypopnea, moderate 09/16/2014    Priority: Medium  . Hyperlipidemia LDL goal <70 09/16/2014    Priority: Medium  . THROMBOCYTOPENIA 07/09/2008    Priority: Medium  . Essential hypertension 06/26/2008    Priority: Medium  . Basal cell carcinoma 07/31/2014    Priority: Low  . Laceration 06/13/2014    Priority: Low  . Herpes zoster 03/30/2012    Priority: Low  . Allergic rhinitis 06/27/2008    Priority: Low  . COLONIC POLYPS 06/26/2008    Priority: Low  . Chronic venous insufficiency 06/26/2008    Priority: Low  . GERD 06/26/2008    Priority: Low  . DEGENERATIVE JOINT DISEASE 06/26/2008    Priority: Low   Past Surgical History  Procedure Laterality Date  . Total knee arthroplasty  03-2002    Dr. Eulas Post,  left  . Total shoulder arthroplasty  03-2005    Dr. Veverly Fells, left   . Cardiac catheterization    . Skin cancer removed  2013    removed from his nose-right side    Family History  Problem Relation Age of Onset  . Pancreatic cancer Mother   . Heart disease Father     Medications- reviewed and updated Current Outpatient Prescriptions  Medication Sig Dispense Refill  . Ascorbic Acid (VITAMIN C) 500 MG tablet Take 500 mg by mouth daily.      Marland Kitchen aspirin 81 MG tablet Take 162 mg by mouth 2 (two) times daily.     Marland Kitchen atenolol (TENORMIN) 50 MG tablet Take 0.5 tablets (25 mg total) by mouth 2 (two) times daily. 60 tablet 6  . atorvastatin (LIPITOR) 20 MG tablet Take 1 tablet (20 mg total) by mouth daily. 90 tablet 3  . B Complex-C-Calcium (B-COMPLEX/VITAMIN C) TABS Take one tablet by mouth every 3 days     . fish oil-omega-3 fatty acids 1000 MG capsule Take one capsule by mouth daily    . isosorbide mononitrate (IMDUR) 60 MG 24 hr tablet Take 1 tablet (60 mg total) by mouth daily. 90 tablet 2  . lisinopril (PRINIVIL,ZESTRIL) 10 MG tablet Take 1 tablet (10 mg total) by mouth daily. 90 tablet 2  . Multiple Vitamins-Minerals (MULTIVITAMIN WITH MINERALS) tablet Take one tablet by mouth every 3 days    .  verapamil (CALAN) 120 MG tablet TAKE 1 TABLET (120 MG TOTAL) BY MOUTH ONCE DAILY 30 tablet 2  . [DISCONTINUED] ezetimibe-simvastatin (VYTORIN) 10-20 MG per tablet Take 1 tablet by mouth at bedtime.       No current facility-administered medications for this visit.    Allergies-reviewed and updated Allergies  Allergen Reactions  . Penicillins     Did not help with strep throat    Social History   Social History  . Marital Status: Married    Spouse Name: Statistician  . Number of Children: 3  . Years of Education: N/A   Occupational History  . retired-sales Chief Financial Officer    Social History Main Topics  . Smoking status: Former Smoker -- 2.00 packs/day for 20 years    Types: Cigarettes    Quit  date: 12/01/1966  . Smokeless tobacco: Never Used  . Alcohol Use: No     Comment: quit in 1954  . Drug Use: No  . Sexual Activity: Not Asked   Other Topics Concern  . None   Social History Narrative   Married 61 years in 2015. 3 kids. 5 grandkids.       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 Oropharynx normal Neck: no thyromegaly CV: RRR no murmurs rubs or gallops Lungs: CTAB no crackles, wheeze, rhonchi Abdomen: soft/nontender/nondistended/normal bowel sounds. No rebound or guarding.  Rectal: normal tone, diffusely enlarged prostate, in midline has a very small nodule noted. FOBT negative. Has a small rectal tag noted likely from prior hemorrhoid.  Ext: no edema Skin: warm, dry, recent cryotherapy and multiple scarred areas noted Neuro: grossly normal, moves all extremities, PERRLA  Assessment/Plan:  81 y.o. male presenting for annual physical.  Health Maintenance counseling: 1. Anticipatory guidance: Patient counseled regarding regular dental exams, wearing seatbelts, wearing sunscreen 2. Risk factor reduction:  Advised patient of need for regular exercise and diet rich and fruits and vegetables to reduce risk of heart attack and stroke. 10 mins stationary bike in AM.  3. Immunizations/screenings/ancillary studies Health Maintenance Due  Topic Date Due  . INFLUENZA VACCINE - today 07/02/2015  4. Prostate cancer screening- advised against, see note below under nodule 5. Colon cancer screening - 01/2006 did have adenomatous polyp but at age  would not recommend repeat evaluation with colonoscopy 6. Skin cancer screening- sees dermatology yearly  Hyperlipidemia LDL goal <70 S: Last LDL <70 at goal on crestor 10mg  Lab Results  Component Value Date   CHOL 138 09/04/2014   HDL 53 09/04/2014   LDLCALC 61 09/04/2014   TRIG 119 09/04/2014   CHOLHDL 2.6 09/04/2014  A/P: continue crestor   Essential hypertension S: controlled.  BP Readings from Last 3 Encounters:  08/10/15 120/70  03/09/15 110/72  10/31/14 141/74  A/P:Continue current meds:  Atenolol, lisinopril, imdur, verapamil.    Allergic rhinitis S: complains of sinus drainage in back of throat and "old man runny nose". Minimal watery itch eyes A/P: trial flonase or Nasacort OTC for at least 3  weeks    Prostate nodule S: Extensive counseling provided about low yield of prostate cancer screening even if prostate cancer was found at his age. Patient still wanted to proceed.  A/P: Rectal exam shows a small nodule in midportion of prostate. We opted to correlate this with PSA and only refer to urology if PSA significantly elevated, otherwise repeat in 1 year- recognizing this could be an early prostate cancer but likely low yield further workup even if it was at patient's age. Patient could also request oncologists opinion on this matter.   COLONIC POLYPS Reviewed last eval in 2007- stated consider FOBT. Did have adenoma at that time. FOBT negative today  desires 1 year CPE  Orders Placed This Encounter  Procedures  . Flu Vaccine QUAD 36+ mos IM

## 2015-09-14 ENCOUNTER — Other Ambulatory Visit: Payer: Self-pay | Admitting: Cardiovascular Disease

## 2015-09-14 DIAGNOSIS — I6523 Occlusion and stenosis of bilateral carotid arteries: Secondary | ICD-10-CM

## 2015-09-17 DIAGNOSIS — Z125 Encounter for screening for malignant neoplasm of prostate: Secondary | ICD-10-CM | POA: Diagnosis not present

## 2015-09-17 DIAGNOSIS — E785 Hyperlipidemia, unspecified: Secondary | ICD-10-CM | POA: Diagnosis not present

## 2015-09-17 DIAGNOSIS — I10 Essential (primary) hypertension: Secondary | ICD-10-CM | POA: Diagnosis not present

## 2015-09-17 DIAGNOSIS — Z79899 Other long term (current) drug therapy: Secondary | ICD-10-CM | POA: Diagnosis not present

## 2015-09-18 ENCOUNTER — Ambulatory Visit (HOSPITAL_COMMUNITY)
Admission: RE | Admit: 2015-09-18 | Discharge: 2015-09-18 | Disposition: A | Discharge status: Home / Self Care | Payer: Medicare HMO | Source: Ambulatory Visit | Attending: Cardiovascular Disease | Admitting: Cardiovascular Disease

## 2015-09-18 DIAGNOSIS — I10 Essential (primary) hypertension: Secondary | ICD-10-CM | POA: Insufficient documentation

## 2015-09-18 DIAGNOSIS — I6523 Occlusion and stenosis of bilateral carotid arteries: Secondary | ICD-10-CM

## 2015-09-18 DIAGNOSIS — E7800 Pure hypercholesterolemia, unspecified: Secondary | ICD-10-CM | POA: Insufficient documentation

## 2015-09-18 LAB — COMPREHENSIVE METABOLIC PANEL
ALBUMIN: 4.1 g/dL (ref 3.6–5.1)
ALT: 18 U/L (ref 9–46)
AST: 23 U/L (ref 10–35)
Alkaline Phosphatase: 67 U/L (ref 40–115)
BILIRUBIN TOTAL: 1.1 mg/dL (ref 0.2–1.2)
BUN: 14 mg/dL (ref 7–25)
CALCIUM: 9.8 mg/dL (ref 8.6–10.3)
CHLORIDE: 105 mmol/L (ref 98–110)
CO2: 30 mmol/L (ref 20–31)
Creat: 0.88 mg/dL (ref 0.70–1.11)
Glucose, Bld: 94 mg/dL (ref 65–99)
Potassium: 4.9 mmol/L (ref 3.5–5.3)
Sodium: 142 mmol/L (ref 135–146)
Total Protein: 6.4 g/dL (ref 6.1–8.1)

## 2015-09-18 LAB — LIPID PANEL
Cholesterol: 149 mg/dL (ref 125–200)
HDL: 59 mg/dL (ref >=40)
LDL Cholesterol: 66 mg/dL (ref <130)
TRIGLYCERIDES: 120 mg/dL (ref <150)
Total CHOL/HDL Ratio: 2.5 Ratio (ref <=5.0)
VLDL: 24 mg/dL (ref <30)

## 2015-09-18 LAB — CBC
HEMATOCRIT: 46.2 % (ref 39.0–52.0)
Hemoglobin: 16.2 g/dL (ref 13.0–17.0)
MCH: 33.9 pg (ref 26.0–34.0)
MCHC: 35.1 g/dL (ref 30.0–36.0)
MCV: 96.7 fL (ref 78.0–100.0)
MPV: 10.5 fL (ref 8.6–12.4)
Platelets: 137 10*3/uL — ABNORMAL LOW (ref 150–400)
RBC: 4.78 MIL/uL (ref 4.22–5.81)
RDW: 13.7 % (ref 11.5–15.5)
WBC: 8.7 10*3/uL (ref 4.0–10.5)

## 2015-09-18 LAB — PSA: PSA: 1.04 ng/mL (ref <=4.00)

## 2015-09-18 LAB — TSH: TSH: 2.217 u[IU]/mL (ref 0.350–4.500)

## 2015-09-25 ENCOUNTER — Encounter: Payer: Self-pay | Admitting: Cardiovascular Disease

## 2015-09-25 ENCOUNTER — Ambulatory Visit (INDEPENDENT_AMBULATORY_CARE_PROVIDER_SITE_OTHER): Payer: Medicare HMO | Admitting: Cardiovascular Disease

## 2015-09-25 VITALS — BP 104/70 | HR 67 | Ht 70.0 in | Wt 178.0 lb

## 2015-09-25 DIAGNOSIS — I10 Essential (primary) hypertension: Secondary | ICD-10-CM | POA: Diagnosis not present

## 2015-09-25 DIAGNOSIS — I25810 Atherosclerosis of coronary artery bypass graft(s) without angina pectoris: Secondary | ICD-10-CM

## 2015-09-25 DIAGNOSIS — E785 Hyperlipidemia, unspecified: Secondary | ICD-10-CM | POA: Diagnosis not present

## 2015-09-25 DIAGNOSIS — G4733 Obstructive sleep apnea (adult) (pediatric): Secondary | ICD-10-CM

## 2015-09-25 MED ORDER — HYDROCHLOROTHIAZIDE 12.5 MG PO CAPS: ORAL_CAPSULE | ORAL | Status: DC

## 2015-09-25 NOTE — Patient Instructions (Signed)
Your physician has recommended you make the following change in your medication: start new prescription for hydrochlorathiazide as directed on the bottle.a prescription has been sent to your Derrick Rivera.  Your physician recommends that you schedule a follow-up appointment and lexiscan myoview in AUGUST 2017.

## 2015-09-25 NOTE — Progress Notes (Signed)
Patient ID: Derrick Rivera, male   DOB: 1924/08/12, 81 y.o.   MRN: [id number]     HPI: Derrick Rivera is a 81 y.o. male who presents for a one year cardiology followup evaluation.  [name] has established CAD and in 1995 he was found to have total occlusion of his RCA. In 2003 he underwent complex high-speed rotational atherectomy of his entire extremely calcified LAD system at multiple sites. At that time, I placed a 3.0x15 mm and 3.0x12 mm S7 stent in the proximal to mid region. A nuclear perfusion study in June 2012 showed an ejection fraction of 58% with moderate perfusion defect compatible with inferior scar with minimal peri-infarction ischemia concordant with his known mid RCA occlusion. He underwent a 2 year followup nuclear perfusion study on 07/06/2013 which was unchanged and showed an ejection fraction of 57% with mild inferolateral hypokinesis and a small inferolateral defect consistent with his prior abnormality. There was no significant reversible ischemia.  Additional problems include hypertension, hyperlipidemia, lymphoma followed by Dr. Ammie Dalton, and he is status post Mohs surgery for skin cancer with reconstructive plastic surgery at [facility].  He has been diagnosed on PSG to have moderate sleep apnea with an AHI of 27.  He never went for his CPAP titration and he does not use CPAP therapy.  He states he is sleeping well.  He wakes up, however, at least 2 times per night.  On a sleep study also was noted to have periodic leg movement disorder during sleep.  He denies painful restless legs.  Since I saw him one year ago, he denies any episodes of chest pain or shortness of breath.  He has noted some mild ankle edema bilaterally.  At times he also has noticed some vertigo-like symptoms.  He denies associated palpitations.  He denies presyncope or syncope.  He presents for evaluation.  Past Medical History  Diagnosis Date  . Allergic rhinitis   . HTN (hypertension)   . Coronary  atherosclerosis of unspecified type of vessel, native or graft   . Peripheral vascular disease ([place])   . Hypercholesterolemia   . GERD (gastroesophageal reflux disease)   . Diverticulosis of colon   . History of colonic polyps   . DJD (degenerative joint disease)   . Non Hodgkin's lymphoma ([place])   . Thrombocytopenia ([place])   . Observed sleep apnea   . Sleep apnea 05-26-2010    sleep study on 05-26-2010  . DIVERTICULOSIS OF COLON 07/09/2008    Past Surgical History  Procedure Laterality Date  . Total knee arthroplasty  03-2002    Dr. Eulas Post, left  . Total shoulder arthroplasty  03-2005    Dr. Veverly Fells, left   . Cardiac catheterization    . Skin cancer removed  2013    removed from his nose-right side    Allergies  Allergen Reactions  . Penicillins     Did not help with strep throat    Current Outpatient Prescriptions  Medication Sig Dispense Refill  . Ascorbic Acid (VITAMIN C) 500 MG tablet Take 500 mg by mouth daily.      Marland Kitchen aspirin 81 MG tablet Take 325 mg by mouth 2 (two) times daily.     Marland Kitchen atenolol (TENORMIN) 50 MG tablet Take 0.5 tablets (25 mg total) by mouth 2 (two) times daily. 60 tablet 6  . atorvastatin (LIPITOR) 20 MG tablet Take 1 tablet (20 mg total) by mouth daily. 90 tablet 3  . B Complex-C-Calcium (B-COMPLEX/VITAMIN C) TABS Take one  tablet by mouth every 3 days     . fish oil-omega-3 fatty acids 1000 MG capsule Take one capsule by mouth daily    . isosorbide mononitrate (IMDUR) 60 MG 24 hr tablet Take 1 tablet (60 mg total) by mouth daily. 90 tablet 2  . lisinopril (PRINIVIL,ZESTRIL) 10 MG tablet Take 1 tablet (10 mg total) by mouth daily. 90 tablet 2  . Multiple Vitamins-Minerals (MULTIVITAMIN WITH MINERALS) tablet Take one tablet by mouth every 3 days    . verapamil (CALAN) 120 MG tablet TAKE 1 TABLET (120 MG TOTAL) BY MOUTH ONCE DAILY 30 tablet 2  . hydrochlorothiazide (MICROZIDE) 12.5 MG capsule Take 1 tablet daily for 3 days then 1 tablet every other day. 30  capsule 6  . [DISCONTINUED] ezetimibe-simvastatin (VYTORIN) 10-20 MG per tablet Take 1 tablet by mouth at bedtime.       No current facility-administered medications for this visit.    Social History   Social History  . Marital Status: Married    Spouse Name: Statistician  . Number of Children: 3  . Years of Education: N/A   Occupational History  . retired-sales Chief Financial Officer    Social History Main Topics  . Smoking status: Former Smoker -- 2.00 packs/day for 20 years    Types: Cigarettes    Quit date: 12/01/1966  . Smokeless tobacco: Never Used  . Alcohol Use: No     Comment: quit in 1954  . Drug Use: No  . Sexual Activity: Not on file   Other Topics Concern  . Not on file   Social History Narrative   Married 61 years in 2015. 3 kids. 5 grandkids.       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 Pancreatic cancer Mother   . Heart disease Father    Social history is notable that he is married has 3 children 5 grandchildren. There is a remote tobacco history but he quit over 40 years ago.   ROS General: Negative; No fevers, chills, or night sweats;  HEENT: Negative; No changes in vision or hearing, sinus congestion, difficulty swallowing Pulmonary: Negative; No cough, wheezing, shortness of breath, hemoptysis Cardiovascular: Negative; No chest pain, presyncope, syncope, palpitations GI: Negative; No nausea, vomiting, diarrhea, or abdominal pain GU: Negative; No dysuria, hematuria, or difficulty voiding Musculoskeletal: Negative; no myalgias, joint pain, or weakness Hematologic/Oncology: Status post plastic surgery for skin was cancer with scarring of his nose.  History of lymphoma; no easy bruising,  bleeding Endocrine: Negative; no heat/cold intolerance; no diabetes Neuro: Occasional vertigo Skin: Negative; No rashes or skin lesions Psychiatric: Negative; No behavioral problems, depression Sleep: Positive for obstructive sleep apnea and periodic limb movement disorder.  No hypnogognic hallucinations, no cataplexy Other comprehensive 14 point system review is negative.   PE BP 104/70 mmHg  Pulse 67  Ht 5' 10"  (1.778 m)  Wt 178 lb (80.74 kg)  BMI 25.54 kg/m2  Repeat blood pressure 130/70.  Wt Readings from Last 3 Encounters:  09/25/15 178 lb (80.74 kg)  08/10/15 178 lb (80.74 kg)  03/09/15 177 lb (80.287 kg)   General: Alert, oriented, no distress.  Skin: normal turgor, no rashes HEENT: Normocephalic, atraumatic. Pupils  round and reactive; sclera anicteric;no lid lag.  Nose without nasal septal hypertrophy, scarring secondary to prior nasal surgery. Mouth/Parynx benign; Mallinpatti scale 3 Neck: No JVD, no carotid bruits  Lungs: clear to ausculatation and percussion; no wheezing or rales Chest wall: Nontender to palpation Heart: RRR, s1 s2 normal 1/6 systolic murmur; no diastolic murmur.  No S3 or S4 gallop.  No rubs, thrills, or cc Abdomen: Mild diastases recti. No renal artery bruits. soft, nontender; no hepatosplenomehaly, BS+; abdominal aorta nontender and not dilated by palpation. Back: No CVA tenderness Pulses 2+ Extremities: Mild right ankle swelling: no clubbing cyanosis, Homan's sign negative  Neurologic: grossly nonfocal Psychological: Normal affect and mood  ECG (independently read by me): Sinus rhythm with an isolated PVC.  QTC interval 431 ms.  Ventricular rate 67.  October 2015 ECG (independently read by me): Sinus bradycardia 55 beats per minute.  Borderline first degree AV block with a PR interval of 202 ms.  No significant ST-T changes.  Prior August 2014 ECG: Sinus rhythm at 68 beats per minute. Intervals normal.  LABS: BMP Latest Ref Rng 09/17/2015  09/04/2014 04/27/2007  Glucose 65 - 99 mg/dL 94 89 95  BUN 7 - 25 mg/dL 14 15 18   Creatinine 0.70 - 1.11 mg/dL 0.88 1.00 1.1  Sodium 135 - 146 mmol/L 142 142 140  Potassium 3.5 - 5.3 mmol/L 4.9 5.0 4.1  Chloride 98 - 110 mmol/L 105 107 105  CO2 20 - 31 mmol/L 30 28 29   Calcium 8.6 - 10.3 mg/dL 9.8 9.8 9.1   Hepatic Function Latest Ref Rng 09/17/2015 09/04/2014 04/27/2007  Total Protein 6.1 - 8.1 g/dL 6.4 6.5 6.3  Albumin 3.6 - 5.1 g/dL 4.1 4.4 3.9  AST 10 - 35 U/L 23 24 29   ALT 9 - 46 U/L 18 20 23   Alk Phosphatase 40 - 115 U/L 67 61 50  Total Bilirubin 0.2 - 1.2 mg/dL 1.1 0.7 1.3(H)  Bilirubin, Direct 0.0-0.3 mg/dL - - 0.2   CBC Latest Ref Rng 09/17/2015 10/31/2014 09/04/2014  WBC 4.0 - 10.5 K/uL 8.7 7.8 7.9  Hemoglobin 13.0 - 17.0 g/dL 16.2 16.1 15.6  Hematocrit 39.0 - 52.0 % 46.2 45.7 45.6  Platelets 150 - 400 K/uL 137(L) 122(L) 132(L)   Lab Results  Component Value Date   MCV 96.7 09/17/2015   MCV 93.8 10/31/2014   MCV 95.8 09/04/2014   Lab Results  Component Value Date   TSH 2.217 09/17/2015   Lipid Panel     Component Value Date/Time   CHOL 149 09/17/2015 0853   TRIG 120 09/17/2015 0853   HDL 59 09/17/2015 0853   CHOLHDL 2.5 09/17/2015 0853   VLDL 24 09/17/2015 0853   LDLCALC 66 09/17/2015 0853   RADIOLOGY: No results found.    ASSESSMENT AND PLAN: Mr. Grima  is an 81 year old white male who has known RCA occlusion by initial catheterization in 1995. He is 13 years status post complex high-speed rotational appendectomy and stenting of his proximal and mid LAD. His last nuclear study in 2014 was unchanged from 2 years previously and showed an area of small inferolateral scar. No significant ischemia was demonstrated.  Marland Kitchen  He has experienced some mild vertigo-like symptoms.  He was not orthostatic on exam today.  He does have some 1+ ankle edema bilaterally, which at times has been making it difficult for him to walk.  I recommended the addition of HCTZ 12.5 mg to take  for the next 3 days and then he  will reduce this to every other day and then changed to as needed for ankle swelling.  I've also recommended he reduce his aspirin from 325-81 mg daily.  His last nuclear stress test was in August 2014.  In August 2017.  I'm scheduling him for follow-up for a three-year follow-up Lexiscan myocardial perfusion study for further evaluation of his CAD.  I have reviewed with him recent laboratory which was done 1 week ago.  Labs are excellent.  His lipid panel is excellent on his current dose of Lipitor 20 mg daily.  When I had seen him one year ago I had reduced his verapamil.  He is not bradycardic.  Presently, he  is tolerating his current dose regimen.  I also reviewed his recent carotid studies which were done on 09/18/2015.  This revealed stable plaque being 40-59% in the right internal carotid and less than 39% in the left internal carotid.  Hip pain vertebral arteries with antegrade flow and normal subclavian arteries bilaterally.  In August 2017 in follow-up of his nuclear perfusion study and further recommendations will be made at that time.    Time spent: 25 minutes   Troy Sine, MD, [facility]  09/25/2015 12:57 PM

## 2015-10-12 ENCOUNTER — Other Ambulatory Visit: Payer: Self-pay | Admitting: *Deleted

## 2015-10-12 MED ORDER — ATENOLOL 50 MG PO TABS: 25.0000 mg | ORAL_TABLET | Freq: Two times a day (BID) | ORAL | Status: DC

## 2015-11-01 ENCOUNTER — Ambulatory Visit (HOSPITAL_BASED_OUTPATIENT_CLINIC_OR_DEPARTMENT_OTHER): Payer: Medicare HMO | Admitting: Oncology

## 2015-11-01 ENCOUNTER — Other Ambulatory Visit (HOSPITAL_BASED_OUTPATIENT_CLINIC_OR_DEPARTMENT_OTHER): Payer: Medicare HMO

## 2015-11-01 VITALS — BP 147/74 | HR 69 | Temp 97.8°F | Resp 18 | Ht 70.0 in | Wt 179.0 lb

## 2015-11-01 DIAGNOSIS — C8590 Non-Hodgkin lymphoma, unspecified, unspecified site: Secondary | ICD-10-CM

## 2015-11-01 DIAGNOSIS — D696 Thrombocytopenia, unspecified: Secondary | ICD-10-CM

## 2015-11-01 DIAGNOSIS — Z8572 Personal history of non-Hodgkin lymphomas: Secondary | ICD-10-CM | POA: Diagnosis not present

## 2015-11-01 LAB — CBC WITH DIFFERENTIAL/PLATELET
BASO%: 0.4 % (ref 0.0–2.0)
Basophils Absolute: 0 10*3/uL (ref 0.0–0.1)
EOS ABS: 0.4 10*3/uL (ref 0.0–0.5)
EOS%: 4.7 % (ref 0.0–7.0)
HCT: 46.9 % (ref 38.4–49.9)
HGB: 15.8 g/dL (ref 13.0–17.1)
LYMPH%: 29.4 % (ref 14.0–49.0)
MCH: 33 pg (ref 27.2–33.4)
MCHC: 33.6 g/dL (ref 32.0–36.0)
MCV: 98.1 fL — ABNORMAL HIGH (ref 79.3–98.0)
MONO#: 0.8 10*3/uL (ref 0.1–0.9)
MONO%: 8.9 % (ref 0.0–14.0)
NEUT%: 56.6 % (ref 39.0–75.0)
NEUTROS ABS: 5.1 10*3/uL (ref 1.5–6.5)
PLATELETS: 126 10*3/uL — AB (ref 140–400)
RBC: 4.78 10*6/uL (ref 4.20–5.82)
RDW: 13.4 % (ref 11.0–14.6)
WBC: 9.1 10*3/uL (ref 4.0–10.3)
lymph#: 2.7 10*3/uL (ref 0.9–3.3)

## 2015-11-01 NOTE — Progress Notes (Signed)
  [place] OFFICE PROGRESS NOTE   Diagnosis: Non-Hodgkin's lymphoma  INTERVAL HISTORY:   Derrick Rivera returns as scheduled. He feels well. No fever, night sweats, or abdominal pain. His appetite has diminished compared to years ago. He is followed closely by dermatology. He complains of a nonproductive cough in the evening when he changes position in bed. He has noted dyspnea for the past year.  Objective:  Vital signs in last 24 hours:  Blood pressure 147/74, pulse 69, temperature 97.8 F (36.6 C), temperature source Oral, resp. rate 18, height 5\' 10"  (1.778 m), weight 179 lb (81.194 kg), SpO2 98 %.    HEENT: Neck without mass Lymphatics: No cervical, supra-clavicular, axillary, or inguinal nodes Resp: Lungs clear bilaterally, no respiratory distress Cardio: Regular rate and rhythm with premature beats GI: No hepatomegaly, firm fullness with associated tenderness superior to the umbilicus Vascular: No leg edema  Skin: Multiple scars over the face from skin cancer removal, scar at the left preauricular area with a 3-4 mm nodular component     Lab Results:  Lab Results  Component Value Date   WBC 9.1 11/01/2015   HGB 15.8 11/01/2015   HCT 46.9 11/01/2015   MCV 98.1* 11/01/2015   PLT 126* 11/01/2015   NEUTROABS 5.1 11/01/2015     Medications: I have reviewed the patient's current medications.  Assessment/Plan: 1. Non-Hodgkin lymphoma diagnosed in 1996. He has been maintained off of specific therapy since 2000. 1. New lymph nodes in the right neck on exam 08/23/2009. 2. Restaging CT evaluation 12/21/2008 revealed a stable mesenteric mass and no evidence for progression of lymphoma. 2. Chronic mild thrombocytopenia. 3. "Mohs" surgery for treatment of a skin cancer at the right side of the nose.  1. He has undergone surgery for a local recurrence of the basal cell carcinoma with repeat reconstruction procedures 4. Chronic neck  pain.   Disposition:  Derrick Rivera remains in clinical remission from non-Hodgkin's lymphoma. He was discharged from the Oncology clinic today. I am available to see him in the future as needed.  I recommended he stay up-to-date on the influenza and pneumococcal vaccines. I recommended he schedule an appointment with Dr. Yong Channel if the cough/dyspnea persist.  Betsy Coder, MD  11/01/2015  10:19 AM

## 2015-11-19 ENCOUNTER — Other Ambulatory Visit: Payer: Self-pay | Admitting: Cardiovascular Disease

## 2015-11-20 ENCOUNTER — Other Ambulatory Visit: Payer: Self-pay | Admitting: Cardiovascular Disease

## 2015-11-20 NOTE — Telephone Encounter (Signed)
Rx(s) sent to pharmacy electronically.  

## 2015-11-20 NOTE — Telephone Encounter (Signed)
DUPLICATE REFILL

## 2015-11-27 ENCOUNTER — Other Ambulatory Visit: Payer: Self-pay | Admitting: Cardiovascular Disease

## 2015-11-27 NOTE — Telephone Encounter (Signed)
Rx request sent to pharmacy.  

## 2015-12-02 HISTORY — PX: MOHS SURGERY: SUR867

## 2015-12-19 ENCOUNTER — Other Ambulatory Visit: Payer: Self-pay | Admitting: Cardiovascular Disease

## 2015-12-19 NOTE — Telephone Encounter (Signed)
Rx request sent to pharmacy.  

## 2015-12-25 DIAGNOSIS — C4442 Squamous cell carcinoma of skin of scalp and neck: Secondary | ICD-10-CM | POA: Diagnosis not present

## 2015-12-25 DIAGNOSIS — C44311 Basal cell carcinoma of skin of nose: Secondary | ICD-10-CM | POA: Diagnosis not present

## 2015-12-25 DIAGNOSIS — D225 Melanocytic nevi of trunk: Secondary | ICD-10-CM | POA: Diagnosis not present

## 2016-01-10 ENCOUNTER — Other Ambulatory Visit: Payer: Self-pay | Admitting: Cardiovascular Disease

## 2016-01-10 NOTE — Telephone Encounter (Signed)
Rx request sent to pharmacy.  

## 2016-01-22 ENCOUNTER — Ambulatory Visit (INDEPENDENT_AMBULATORY_CARE_PROVIDER_SITE_OTHER): Payer: Medicare HMO | Admitting: Family Medicine

## 2016-01-22 ENCOUNTER — Encounter: Payer: Self-pay | Admitting: Family Medicine

## 2016-01-22 VITALS — BP 128/80 | HR 62 | Temp 98.1°F | Wt 180.0 lb

## 2016-01-22 DIAGNOSIS — R310 Gross hematuria: Secondary | ICD-10-CM | POA: Diagnosis not present

## 2016-01-22 DIAGNOSIS — R319 Hematuria, unspecified: Secondary | ICD-10-CM | POA: Diagnosis not present

## 2016-01-22 DIAGNOSIS — N402 Nodular prostate without lower urinary tract symptoms: Secondary | ICD-10-CM

## 2016-01-22 LAB — POC URINALSYSI DIPSTICK (AUTOMATED)
BILIRUBIN UA: NEGATIVE
Glucose, UA: NEGATIVE
Ketones, UA: NEGATIVE
LEUKOCYTES UA: NEGATIVE
NITRITE UA: NEGATIVE
PH UA: 5.5
PROTEIN UA: NEGATIVE
RBC UA: NEGATIVE
Spec Grav, UA: >=1.03
UROBILINOGEN UA: 0.2

## 2016-01-22 NOTE — Progress Notes (Signed)
Derrick Reddish, MD  Subjective:  Derrick Rivera is a 82 y.o. year old very pleasant male patient who presents for/with See problem oriented charting ROS- no fever, chills, nausea, vomiting. No fatigue or unintentional weight loss. No melena or BRBPR  Past Medical History-  Patient Active Problem List   Diagnosis Date Noted  . Prostate nodule 08/10/2015    Priority: High  . NON-HODGKIN'S LYMPHOMA 07/09/2008    Priority: High  . Coronary artery disease 06/26/2008    Priority: High  . Obstructive sleep apnea hypopnea, moderate 09/16/2014    Priority: Medium  . Hyperlipidemia LDL goal <70 09/16/2014    Priority: Medium  . THROMBOCYTOPENIA 07/09/2008    Priority: Medium  . Essential hypertension 06/26/2008    Priority: Medium  . Basal cell carcinoma 07/31/2014    Priority: Low  . Laceration 06/13/2014    Priority: Low  . Herpes zoster 03/30/2012    Priority: Low  . Allergic rhinitis 06/27/2008    Priority: Low  . COLONIC POLYPS 06/26/2008    Priority: Low  . Chronic venous insufficiency 06/26/2008    Priority: Low  . GERD 06/26/2008    Priority: Low  . DEGENERATIVE JOINT DISEASE 06/26/2008    Priority: Low    Medications- reviewed and updated Current Outpatient Prescriptions  Medication Sig Dispense Refill  . Ascorbic Acid (VITAMIN C) 500 MG tablet Take 500 mg by mouth daily.      Marland Kitchen aspirin 325 MG tablet Take 325 mg by mouth daily.    Marland Kitchen atenolol (TENORMIN) 50 MG tablet Take 0.5 tablets (25 mg total) by mouth 2 (two) times daily. 60 tablet 11  . atorvastatin (LIPITOR) 20 MG tablet TAKE 1 TABLET (20 MG TOTAL) BY MOUTH DAILY. 90 tablet 2  . B Complex-C-Calcium (B-COMPLEX/VITAMIN C) TABS Take one tablet by mouth every other day    . fish oil-omega-3 fatty acids 1000 MG capsule Take one capsule by mouth daily    . isosorbide mononitrate (IMDUR) 60 MG 24 hr tablet TAKE 1 TABLET (60 MG TOTAL) BY MOUTH DAILY. 90 tablet 1  . lisinopril (PRINIVIL,ZESTRIL) 10 MG tablet TAKE 1 TABLET  (10 MG TOTAL) BY MOUTH DAILY. 90 tablet 1  . Multiple Vitamins-Minerals (MULTIVITAMIN WITH MINERALS) tablet Take one tablet by mouth every other day    . verapamil (CALAN) 120 MG tablet TAKE 1 TABLET (120 MG TOTAL) BY MOUTH ONCE DAILY 30 tablet 6  . [DISCONTINUED] ezetimibe-simvastatin (VYTORIN) 10-20 MG per tablet Take 1 tablet by mouth at bedtime.       No current facility-administered medications for this visit.    Objective: BP 128/80 mmHg  Pulse 62  Temp(Src) 98.1 F (36.7 C)  Wt 180 lb (81.647 kg) Gen: NAD, resting comfortably CV: RRR no murmurs rubs or gallops Lungs: CTAB no crackles, wheeze, rhonchi Abdomen: soft/nontender including suprapubic/nondistended/normal bowel sounds.  No CVA tenderness Ext: no edema Skin: warm, dry  Assessment/Plan:  Gross Hematuria/prostate nodule S: 2 weeks ago he was working pretty heavily in his yard and that evening noted a few drops of blood. The next morning felt a sensation in his penis and when he peeded there was a long string of clot that he said if rolled up would be about the size of his thumb. No recurrence since that time. He has never has blood in his urine before. He does take aspirin but no anticoagulants.  A/P: High functioning 82 year old presents with painless gross hematuria in setting of prior detected prostate nodule which  we had been following with normal PSAs (see below). We will refer to urology for further evaluation given former smoker with 40 pack years though quit 1968. Concern would be for urological malignancy. Stone would be possible but had minimal pain. Bleeding from prostate also possible- would ask their opinion on CT, cystoscopy vs monitoring given his age- although as stated he is high functioning for age. Urine was negative for blood today.   Defers prostate exam for urology  Lab Results  Component Value Date   PSA 1.04 09/17/2015   PSA 0.99 09/04/2014   PSA 0.76 04/27/2007   Results for orders placed or  performed in visit on 01/22/16 (from the past 24 hour(s))  POCT Urinalysis Dipstick (Automated)     Status: None   Collection Time: 01/22/16  9:21 AM  Result Value Ref Range   Color, UA yellow    Clarity, UA clear    Glucose, UA neg    Bilirubin, UA neg    Ketones, UA neg    Spec Grav, UA >=1.030    Blood, UA neg    pH, UA 5.5    Protein, UA neg    Urobilinogen, UA 0.2    Nitrite, UA neg    Leukocytes, UA Negative Negative   Return precautions advised.   Orders Placed This Encounter  Procedures  . Ambulatory referral to Urology    Referral Priority:  Routine    Referral Type:  Consultation    Referral Reason:  Specialty Services Required    Requested Specialty:  Urology    Number of Visits Requested:  1  . POCT Urinalysis Dipstick (Automated)

## 2016-01-22 NOTE — Patient Instructions (Signed)
We will call you within a week about your referral to urology. If you do not hear within 2 weeks, give [place] a call. This is not a rush but I do want you to be seen sometime in the next month or so.

## 2016-01-23 DIAGNOSIS — L905 Scar conditions and fibrosis of skin: Secondary | ICD-10-CM | POA: Diagnosis not present

## 2016-01-23 DIAGNOSIS — C4441 Basal cell carcinoma of skin of scalp and neck: Secondary | ICD-10-CM | POA: Diagnosis not present

## 2016-01-23 DIAGNOSIS — C4442 Squamous cell carcinoma of skin of scalp and neck: Secondary | ICD-10-CM | POA: Diagnosis not present

## 2016-01-23 DIAGNOSIS — T8730 Neuroma of amputation stump, unspecified extremity: Secondary | ICD-10-CM | POA: Diagnosis not present

## 2016-02-26 DIAGNOSIS — R310 Gross hematuria: Secondary | ICD-10-CM | POA: Diagnosis not present

## 2016-02-26 DIAGNOSIS — Z0000 Encounter for general adult medical examination without abnormal findings: Secondary | ICD-10-CM | POA: Diagnosis not present

## 2016-03-05 DIAGNOSIS — K5730 Diverticulosis of large intestine without perforation or abscess without bleeding: Secondary | ICD-10-CM | POA: Diagnosis not present

## 2016-03-05 DIAGNOSIS — R310 Gross hematuria: Secondary | ICD-10-CM | POA: Diagnosis not present

## 2016-03-31 HISTORY — PX: REFRACTIVE SURGERY: SHX103

## 2016-04-03 DIAGNOSIS — H43813 Vitreous degeneration, bilateral: Secondary | ICD-10-CM | POA: Diagnosis not present

## 2016-04-03 DIAGNOSIS — H353111 Nonexudative age-related macular degeneration, right eye, early dry stage: Secondary | ICD-10-CM | POA: Diagnosis not present

## 2016-04-03 DIAGNOSIS — Z0100 Encounter for examination of eyes and vision without abnormal findings: Secondary | ICD-10-CM | POA: Diagnosis not present

## 2016-04-03 DIAGNOSIS — H4720 Unspecified optic atrophy: Secondary | ICD-10-CM | POA: Diagnosis not present

## 2016-04-04 DIAGNOSIS — D494 Neoplasm of unspecified behavior of bladder: Secondary | ICD-10-CM | POA: Diagnosis not present

## 2016-04-04 DIAGNOSIS — R310 Gross hematuria: Secondary | ICD-10-CM | POA: Diagnosis not present

## 2016-04-04 DIAGNOSIS — Z0000 Encounter for general adult medical examination without abnormal findings: Secondary | ICD-10-CM | POA: Diagnosis not present

## 2016-04-09 ENCOUNTER — Telehealth: Payer: Self-pay | Admitting: *Deleted

## 2016-04-09 ENCOUNTER — Other Ambulatory Visit: Payer: Self-pay | Admitting: Urology

## 2016-04-09 NOTE — Telephone Encounter (Signed)
[facility] Dr Alyson Ingles  Requesting fax # to send surgical clearance form.

## 2016-04-29 NOTE — Patient Instructions (Addendum)
Derrick Rivera  04/29/2016   Your procedure is scheduled on: 05/05/16  Report to [facility] Main  Entrance take [facility]  elevators to 3rd floor to  [name] at 7:30 AM.  Call this number if you have problems the morning of surgery [phone]   Remember: ONLY 1 PERSON MAY GO WITH YOU TO SHORT STAY TO GET  READY MORNING OF [name]  Do not eat food or drink liquids :After Midnight.     Take these medicines the morning of surgery with A SIP OF WATER: Atenolol, Isosorbide                                              Do not wear jewelry, lotions, powders, deodorant                          Men may shave face and neck.   Do not bring valuables to the hospital. [place].  Contacts, dentures or bridgework may not be worn into surgery.      Patients discharged the day of surgery will not be allowed to drive home.  Name and phone number of your dri  _____________________________________________________________________             [facility] - Preparing for Surgery Before surgery, you can play an important role.  Because skin is not sterile, your skin needs to be as free of germs as possible.  You can reduce the number of germs on your skin by washing with CHG (chlorahexidine gluconate) soap before surgery.  CHG is an antiseptic cleaner which kills germs and bonds with the skin to continue killing germs even after washing. Please DO NOT use if you have an allergy to CHG or antibacterial soaps.  If your skin becomes reddened/irritated stop using the CHG and inform your nurse when you arrive at Short Stay. Do not shave (including legs and underarms) for at least 48 hours prior to the first CHG shower.  You may shave your face/neck. Please follow these instructions carefully:  1.  Shower with CHG Soap the night before surgery and the  morning of Surgery.  2.  If you choose to wash your hair, wash your hair  first as usual with your  normal  shampoo.  3.  After you shampoo, rinse your hair and body thoroughly to remove the  shampoo.                           4.  Use CHG as you would any other liquid soap.  You can apply chg directly  to the skin and wash                       Gently with a scrungie or clean washcloth.  5.  Apply the CHG Soap to your body ONLY FROM THE NECK DOWN.   Do not use on face/ open                           Wound or open sores. Avoid  contact with eyes, ears mouth and genitals (private parts).                       Wash face,  Genitals (private parts) with your normal soap.             6.  Wash thoroughly, paying special attention to the area where your surgery  will be performed.  7.  Thoroughly rinse your body with warm water from the neck down.  8.  DO NOT shower/wash with your normal soap after using and rinsing off  the CHG Soap.                9.  Pat yourself dry with a clean towel.            10.  Wear clean pajamas.            11.  Place clean sheets on your bed the night of your first shower and do not  sleep with pets. Day of Surgery : Do not apply any lotions/deodorants the morning of surgery.  Please wear clean clothes to the hospital/surgery center.  FAILURE TO FOLLOW THESE INSTRUCTIONS MAY RESULT IN THE CANCELLATION OF YOUR SURGERY PATIENT SIGNATURE_________________________________  NURSE SIGNATURE__________________________________  ________________________________________________________________________ [facility] - Preparing for Surgery Before surgery, you can play an important role.  Because skin is not sterile, your skin needs to be as free of germs as possible.  You can reduce the number of germs on your skin by washing with CHG (chlorahexidine gluconate) soap before surgery.  CHG is an antiseptic cleaner which kills germs and bonds with the skin to continue killing germs even after washing. Please DO NOT use if you have an allergy to CHG or  antibacterial soaps.  If your skin becomes reddened/irritated stop using the CHG and inform your nurse when you arrive at Short Stay. Do not shave (including legs and underarms) for at least 48 hours prior to the first CHG shower.  You may shave your face/neck. Please follow these instructions carefully:  1.  Shower with CHG Soap the night before surgery and the  morning of Surgery.  2.  If you choose to wash your hair, wash your hair first as usual with your  normal  shampoo.  3.  After you shampoo, rinse your hair and body thoroughly to remove the  shampoo.                           4.  Use CHG as you would any other liquid soap.  You can apply chg directly  to the skin and wash                       Gently with a scrungie or clean washcloth.  5.  Apply the CHG Soap to your body ONLY FROM THE NECK DOWN.   Do not use on face/ open                           Wound or open sores. Avoid contact with eyes, ears mouth and genitals (private parts).                       Wash face,  Genitals (private parts) with your normal soap.             6.  Wash thoroughly, paying  special attention to the area where your surgery  will be performed.  7.  Thoroughly rinse your body with warm water from the neck down.  8.  DO NOT shower/wash with your normal soap after using and rinsing off  the CHG Soap.                9.  Pat yourself dry with a clean towel.            10.  Wear clean pajamas.            11.  Place clean sheets on your bed the night of your first shower and do not  sleep with pets. Day of Surgery : Do not apply any lotions/deodorants the morning of surgery.  Please wear clean clothes to the hospital/surgery center.  FAILURE TO FOLLOW THESE INSTRUCTIONS MAY RESULT IN THE CANCELLATION OF YOUR SURGERY PATIENT SIGNATURE_________________________________  NURSE SIGNATURE__________________________________  ________________________________________________________________________

## 2016-04-30 ENCOUNTER — Encounter (HOSPITAL_COMMUNITY): Payer: Self-pay

## 2016-04-30 ENCOUNTER — Encounter (HOSPITAL_COMMUNITY)
Admission: RE | Admit: 2016-04-30 | Discharge: 2016-04-30 | Disposition: A | Discharge status: Home / Self Care | Payer: Medicare HMO | Source: Ambulatory Visit | Attending: Urology | Admitting: Urology

## 2016-04-30 DIAGNOSIS — Z01812 Encounter for preprocedural laboratory examination: Secondary | ICD-10-CM | POA: Diagnosis not present

## 2016-04-30 DIAGNOSIS — N329 Bladder disorder, unspecified: Secondary | ICD-10-CM | POA: Diagnosis not present

## 2016-04-30 LAB — BASIC METABOLIC PANEL
ANION GAP: 6 (ref 5–15)
BUN: 21 mg/dL — AB (ref 6–20)
CHLORIDE: 106 mmol/L (ref 101–111)
CO2: 29 mmol/L (ref 22–32)
Calcium: 9.6 mg/dL (ref 8.9–10.3)
Creatinine, Ser: 0.9 mg/dL (ref 0.61–1.24)
Glucose, Bld: 96 mg/dL (ref 65–99)
POTASSIUM: 4.5 mmol/L (ref 3.5–5.1)
SODIUM: 141 mmol/L (ref 135–145)

## 2016-04-30 LAB — CBC
HCT: 45.9 % (ref 39.0–52.0)
HEMOGLOBIN: 15.7 g/dL (ref 13.0–17.0)
MCH: 33 pg (ref 26.0–34.0)
MCHC: 34.2 g/dL (ref 30.0–36.0)
MCV: 96.4 fL (ref 78.0–100.0)
PLATELETS: 129 10*3/uL — AB (ref 150–400)
RBC: 4.76 MIL/uL (ref 4.22–5.81)
RDW: 13.1 % (ref 11.5–15.5)
WBC: 8.9 10*3/uL (ref 4.0–10.5)

## 2016-04-30 NOTE — Pre-Procedure Instructions (Signed)
Lab results routed to Dr. Alyson Ingles

## 2016-04-30 NOTE — Pre-Procedure Instructions (Signed)
EKG in EPIC 

## 2016-05-01 ENCOUNTER — Telehealth: Payer: Self-pay | Admitting: Cardiovascular Disease

## 2016-05-01 DIAGNOSIS — H26492 Other secondary cataract, left eye: Secondary | ICD-10-CM | POA: Diagnosis not present

## 2016-05-01 NOTE — Telephone Encounter (Signed)
Request for surgical clearance:  1. What type of surgery is being performed? Cysto retrograde (bladder tumor)  2. When is this surgery scheduled? 05/05/16  3. Are there any medications that need to be held prior to surgery and how long? Aspirin (supposed to stop on 5/30)  4. Name of physician performing surgery? Mckenzie   5. What is your office phone and fax number? [phone] 6.

## 2016-05-01 NOTE — Telephone Encounter (Signed)
Surgical clearance request routed to Dr. [name].

## 2016-05-02 NOTE — Telephone Encounter (Signed)
Follow-up     The office for the urologist is calling back making sure the pt gets cleared for surgery. They were hopefully thinking they could here back with by Monday.

## 2016-05-02 NOTE — Telephone Encounter (Signed)
Note faxed, urology office contacted.

## 2016-05-02 NOTE — Telephone Encounter (Signed)
[place] for surgery; hold ASA FOR 5 DAYS

## 2016-05-05 ENCOUNTER — Ambulatory Visit (HOSPITAL_COMMUNITY)
Admission: RE | Admit: 2016-05-05 | Discharge: 2016-05-05 | Disposition: A | Discharge status: Home / Self Care | Payer: Medicare HMO | Source: Ambulatory Visit | Attending: Urology | Admitting: Urology

## 2016-05-05 ENCOUNTER — Ambulatory Visit (HOSPITAL_COMMUNITY): Payer: Medicare HMO | Admitting: Anesthesiology

## 2016-05-05 ENCOUNTER — Encounter (HOSPITAL_COMMUNITY)
Admission: RE | Disposition: A | Discharge status: Home / Self Care | Payer: Self-pay | Source: Ambulatory Visit | Attending: Urology

## 2016-05-05 ENCOUNTER — Encounter (HOSPITAL_COMMUNITY): Payer: Self-pay | Admitting: Anesthesiology

## 2016-05-05 DIAGNOSIS — R310 Gross hematuria: Secondary | ICD-10-CM | POA: Diagnosis not present

## 2016-05-05 DIAGNOSIS — Z8572 Personal history of non-Hodgkin lymphomas: Secondary | ICD-10-CM | POA: Diagnosis not present

## 2016-05-05 DIAGNOSIS — E7800 Pure hypercholesterolemia, unspecified: Secondary | ICD-10-CM | POA: Insufficient documentation

## 2016-05-05 DIAGNOSIS — Z96612 Presence of left artificial shoulder joint: Secondary | ICD-10-CM | POA: Diagnosis not present

## 2016-05-05 DIAGNOSIS — C679 Malignant neoplasm of bladder, unspecified: Secondary | ICD-10-CM | POA: Insufficient documentation

## 2016-05-05 DIAGNOSIS — Z79899 Other long term (current) drug therapy: Secondary | ICD-10-CM | POA: Diagnosis not present

## 2016-05-05 DIAGNOSIS — G4730 Sleep apnea, unspecified: Secondary | ICD-10-CM | POA: Diagnosis not present

## 2016-05-05 DIAGNOSIS — Z955 Presence of coronary angioplasty implant and graft: Secondary | ICD-10-CM | POA: Diagnosis not present

## 2016-05-05 DIAGNOSIS — Z951 Presence of aortocoronary bypass graft: Secondary | ICD-10-CM | POA: Diagnosis not present

## 2016-05-05 DIAGNOSIS — Z87891 Personal history of nicotine dependence: Secondary | ICD-10-CM | POA: Insufficient documentation

## 2016-05-05 DIAGNOSIS — I739 Peripheral vascular disease, unspecified: Secondary | ICD-10-CM | POA: Diagnosis not present

## 2016-05-05 DIAGNOSIS — Z96652 Presence of left artificial knee joint: Secondary | ICD-10-CM | POA: Insufficient documentation

## 2016-05-05 DIAGNOSIS — I2510 Atherosclerotic heart disease of native coronary artery without angina pectoris: Secondary | ICD-10-CM | POA: Insufficient documentation

## 2016-05-05 DIAGNOSIS — N3289 Other specified disorders of bladder: Secondary | ICD-10-CM | POA: Diagnosis not present

## 2016-05-05 DIAGNOSIS — Z7982 Long term (current) use of aspirin: Secondary | ICD-10-CM | POA: Diagnosis not present

## 2016-05-05 DIAGNOSIS — C672 Malignant neoplasm of lateral wall of bladder: Secondary | ICD-10-CM | POA: Diagnosis not present

## 2016-05-05 DIAGNOSIS — I10 Essential (primary) hypertension: Secondary | ICD-10-CM | POA: Diagnosis not present

## 2016-05-05 HISTORY — PX: TRANSURETHRAL RESECTION OF BLADDER TUMOR WITH GYRUS (TURBT-GYRUS): SHX6458

## 2016-05-05 HISTORY — PX: CYSTOSCOPY/RETROGRADE/URETEROSCOPY/STONE EXTRACTION WITH BASKET: SHX5317

## 2016-05-05 SURGERY — TRANSURETHRAL RESECTION OF BLADDER TUMOR WITH GYRUS (TURBT-GYRUS)
Anesthesia: General

## 2016-05-05 MED ORDER — PHENYLEPHRINE HCL 10 MG/ML IJ SOLN
INTRAMUSCULAR | Status: DC | PRN
  Administered 2016-05-05: 80 ug via INTRAVENOUS

## 2016-05-05 MED ORDER — PROPOFOL 10 MG/ML IV BOLUS
INTRAVENOUS | Status: AC
Start: 2016-05-05 — End: 2016-05-05
  Filled 2016-05-05: qty 20

## 2016-05-05 MED ORDER — TRAMADOL HCL 50 MG PO TABS: 50.0000 mg | ORAL_TABLET | Freq: Four times a day (QID) | ORAL | Status: DC | PRN

## 2016-05-05 MED ORDER — EPHEDRINE SULFATE 50 MG/ML IJ SOLN
INTRAMUSCULAR | Status: AC
  Filled 2016-05-05: qty 1

## 2016-05-05 MED ORDER — EPHEDRINE SULFATE 50 MG/ML IJ SOLN
INTRAMUSCULAR | Status: DC | PRN
  Administered 2016-05-05 (×2): 10 mg via INTRAVENOUS

## 2016-05-05 MED ORDER — PROMETHAZINE HCL 25 MG/ML IJ SOLN: 6.2500 mg | INTRAMUSCULAR | Status: DC | PRN

## 2016-05-05 MED ORDER — SUCCINYLCHOLINE 20MG/ML (10ML) SYRINGE FOR MEDFUSION PUMP - OPTIME
INTRAMUSCULAR | Status: DC | PRN
  Administered 2016-05-05: 100 mg via INTRAVENOUS

## 2016-05-05 MED ORDER — ACETAMINOPHEN 10 MG/ML IV SOLN
INTRAVENOUS | Status: AC
  Filled 2016-05-05: qty 100

## 2016-05-05 MED ORDER — FENTANYL CITRATE (PF) 100 MCG/2ML IJ SOLN: 25.0000 ug | INTRAMUSCULAR | Status: DC | PRN

## 2016-05-05 MED ORDER — STERILE WATER FOR IRRIGATION IR SOLN
Status: DC | PRN
  Administered 2016-05-05: 500 mL

## 2016-05-05 MED ORDER — ONDANSETRON HCL 4 MG/2ML IJ SOLN
INTRAMUSCULAR | Status: AC
  Filled 2016-05-05: qty 2

## 2016-05-05 MED ORDER — FENTANYL CITRATE (PF) 100 MCG/2ML IJ SOLN
INTRAMUSCULAR | Status: DC | PRN
  Administered 2016-05-05: 100 ug via INTRAVENOUS

## 2016-05-05 MED ORDER — FENTANYL CITRATE (PF) 100 MCG/2ML IJ SOLN
INTRAMUSCULAR | Status: AC
  Filled 2016-05-05: qty 2

## 2016-05-05 MED ORDER — LIDOCAINE HCL (CARDIAC) 20 MG/ML IV SOLN
INTRAVENOUS | Status: AC
  Filled 2016-05-05: qty 5

## 2016-05-05 MED ORDER — SUGAMMADEX SODIUM 200 MG/2ML IV SOLN
INTRAVENOUS | Status: AC
  Filled 2016-05-05: qty 2

## 2016-05-05 MED ORDER — ROCURONIUM BROMIDE 100 MG/10ML IV SOLN
INTRAVENOUS | Status: DC | PRN
  Administered 2016-05-05: 20 mg via INTRAVENOUS

## 2016-05-05 MED ORDER — DIATRIZOATE MEGLUMINE 30 % UR SOLN
URETHRAL | Status: AC
  Filled 2016-05-05: qty 100

## 2016-05-05 MED ORDER — 0.9 % SODIUM CHLORIDE (POUR BTL) OPTIME
TOPICAL | Status: DC | PRN
  Administered 2016-05-05: 1000 mL

## 2016-05-05 MED ORDER — SODIUM CHLORIDE 0.9 % IR SOLN
Status: DC | PRN
  Administered 2016-05-05: 6000 mL

## 2016-05-05 MED ORDER — PROPOFOL 10 MG/ML IV BOLUS
INTRAVENOUS | Status: DC | PRN
  Administered 2016-05-05: 110 mg via INTRAVENOUS

## 2016-05-05 MED ORDER — ONDANSETRON HCL 4 MG/2ML IJ SOLN
INTRAMUSCULAR | Status: DC | PRN
  Administered 2016-05-05: 4 mg via INTRAVENOUS

## 2016-05-05 MED ORDER — CEFAZOLIN SODIUM-DEXTROSE 2-4 GM/100ML-% IV SOLN
2.0000 g | INTRAVENOUS | Status: AC
Start: 2016-05-05 — End: 2016-05-05
  Administered 2016-05-05: 2 g via INTRAVENOUS
  Filled 2016-05-05: qty 100

## 2016-05-05 MED ORDER — DIATRIZOATE MEGLUMINE 30 % UR SOLN
URETHRAL | Status: DC | PRN
  Administered 2016-05-05: 100 mL

## 2016-05-05 MED ORDER — DEXAMETHASONE SODIUM PHOSPHATE 10 MG/ML IJ SOLN
INTRAMUSCULAR | Status: DC | PRN
  Administered 2016-05-05: 10 mg via INTRAVENOUS

## 2016-05-05 MED ORDER — SUGAMMADEX SODIUM 200 MG/2ML IV SOLN
INTRAVENOUS | Status: DC | PRN
  Administered 2016-05-05: 160 mg via INTRAVENOUS

## 2016-05-05 MED ORDER — SODIUM CHLORIDE 0.9 % IJ SOLN
INTRAMUSCULAR | Status: AC
  Filled 2016-05-05: qty 10

## 2016-05-05 MED ORDER — LACTATED RINGERS IV SOLN
INTRAVENOUS | Status: DC
  Administered 2016-05-05: 11:00:00 via INTRAVENOUS
  Administered 2016-05-05: 1000 mL via INTRAVENOUS

## 2016-05-05 MED ORDER — ACETAMINOPHEN 10 MG/ML IV SOLN
INTRAVENOUS | Status: DC | PRN
  Administered 2016-05-05: 1000 mg via INTRAVENOUS

## 2016-05-05 MED ORDER — LIDOCAINE HCL (CARDIAC) 20 MG/ML IV SOLN
INTRAVENOUS | Status: DC | PRN
  Administered 2016-05-05: 75 mg via INTRAVENOUS

## 2016-05-05 MED ORDER — CEFAZOLIN SODIUM-DEXTROSE 2-4 GM/100ML-% IV SOLN
INTRAVENOUS | Status: AC
  Filled 2016-05-05: qty 100

## 2016-05-05 SURGICAL SUPPLY — 27 items
BAG URINE DRAINAGE (UROLOGICAL SUPPLIES) ×3 IMPLANT
BAG URO CATCHER STRL LF (MISCELLANEOUS) ×3 IMPLANT
BASKET DAKOTA 1.9FR 11X120 (BASKET) IMPLANT
BASKET LASER NITINOL 1.9FR (BASKET) IMPLANT
CATH FOLEY 3WAY 30CC 22FR (CATHETERS) ×3 IMPLANT
CATH FOLEY LATEX FREE 20FR (CATHETERS)
CATH FOLEY LF 20FR (CATHETERS) IMPLANT
CATH INTERMIT  6FR 70CM (CATHETERS) ×3 IMPLANT
CLOTH BEACON ORANGE TIMEOUT ST (SAFETY) ×3 IMPLANT
FIBER LASER TRAC TIP (UROLOGICAL SUPPLIES) IMPLANT
GLOVE BIO SURGEON STRL SZ8 (GLOVE) ×3 IMPLANT
GOWN STRL REUS W/TWL XL LVL3 (GOWN DISPOSABLE) ×3 IMPLANT
GUIDEWIRE ANG ZIPWIRE 038X150 (WIRE) ×3 IMPLANT
GUIDEWIRE STR DUAL SENSOR (WIRE) IMPLANT
IV NS 1000ML (IV SOLUTION) ×1
IV NS 1000ML BAXH (IV SOLUTION) ×2 IMPLANT
LOOP CUT BIPOLAR 24F LRG (ELECTROSURGICAL) ×3 IMPLANT
MANIFOLD NEPTUNE II (INSTRUMENTS) ×3 IMPLANT
PACK CYSTO (CUSTOM PROCEDURE TRAY) ×3 IMPLANT
PLUG CATH AND CAP STER (CATHETERS) ×3 IMPLANT
SHEATH ACCESS URETERAL 54CM (SHEATH) IMPLANT
STENT CONTOUR 6FRX26X.038 (STENTS) ×3 IMPLANT
SYR 30ML LL (SYRINGE) ×3 IMPLANT
SYR CONTROL 10ML LL (SYRINGE) IMPLANT
SYRINGE IRR TOOMEY STRL 70CC (SYRINGE) ×3 IMPLANT
TUBE FEEDING 8FR 16IN STR KANG (MISCELLANEOUS) IMPLANT
TUBING CONNECTING 10 (TUBING) ×3 IMPLANT

## 2016-05-05 NOTE — H&P (Signed)
Urology Admission H&P  Chief Complaint: gross hematuria  History of Present Illness: Derrick Rivera is a 82yo with a hx of gross hematuria and was found to have a bladder tumor on office cystoscopy. No other LUTS. No pelvic pain  Past Medical History  Diagnosis Date  . Allergic rhinitis   . HTN (hypertension)   . Coronary atherosclerosis of unspecified type of vessel, native or graft   . Peripheral vascular disease ([place])   . Hypercholesterolemia   . GERD (gastroesophageal reflux disease)   . Diverticulosis of colon   . History of colonic polyps   . DJD (degenerative joint disease)   . Thrombocytopenia ([employer])   . Observed sleep apnea   . Sleep apnea 05-26-2010    sleep study on 05-26-2010  . DIVERTICULOSIS OF COLON 07/09/2008  . Shortness of breath dyspnea     on exertion  . Non Hodgkin's lymphoma ([place])     in remission  . Skin cancer    Past Surgical History  Procedure Laterality Date  . Total knee arthroplasty  03-2002    Dr. Eulas Post, left  . Total shoulder arthroplasty  03-2005    Dr. Veverly Fells, left   . Cardiac catheterization    . Skin cancer removed  2013    removed from his nose-right side    Home Medications:  Prescriptions prior to admission  Medication Sig Dispense Refill Last Dose  . Ascorbic Acid (VITAMIN C) 500 MG tablet Take 500 mg by mouth every morning.    05/04/2016 at Unknown time  . aspirin 325 MG tablet Take 162.5 mg by mouth 2 (two) times daily.    04/29/2016  . atenolol (TENORMIN) 50 MG tablet Take 0.5 tablets (25 mg total) by mouth 2 (two) times daily. 60 tablet 11 05/05/2016 at 0615  . atorvastatin (LIPITOR) 20 MG tablet TAKE 1 TABLET (20 MG TOTAL) BY MOUTH DAILY. (Patient taking differently: TAKE 1 TABLET (20 MG TOTAL) BY MOUTH AT BEDTIME.) 90 tablet 2 05/04/2016 at Unknown time  . B Complex-C-Calcium (B-COMPLEX/VITAMIN C) TABS Take one tablet by mouth every other day   Past Week at Unknown time  . fish oil-omega-3 fatty acids 1000 MG capsule Take one capsule by mouth  daily   Past Week at Unknown time  . isosorbide mononitrate (IMDUR) 60 MG 24 hr tablet TAKE 1 TABLET (60 MG TOTAL) BY MOUTH DAILY. (Patient taking differently: TAKE 1 TABLET (60 MG TOTAL) BY MOUTH IN THE MORNING.) 90 tablet 1 05/05/2016 at 0615  . lisinopril (PRINIVIL,ZESTRIL) 10 MG tablet TAKE 1 TABLET (10 MG TOTAL) BY MOUTH DAILY. (Patient taking differently: TAKE 1 TABLET (10 MG TOTAL) BY MOUTH DAILY at 12 NOON.) 90 tablet 1 05/04/2016 at Unknown time  . Multiple Vitamins-Minerals (MULTIVITAMIN WITH MINERALS) tablet Take one tablet by mouth every other day   Past Week at Unknown time  . verapamil (CALAN) 120 MG tablet TAKE 1 TABLET (120 MG TOTAL) BY MOUTH ONCE DAILY (Patient taking differently: TAKE 1 TABLET (120 MG TOTAL) BY MOUTH At BEDTIME.) 30 tablet 6 05/04/2016 at Unknown time   Allergies:  Allergies  Allergen Reactions  . Penicillins Other (See Comments)    Did not help with strep throat Has patient had a PCN reaction causing immediate rash, facial/tongue/throat swelling, SOB or lightheadedness with hypotension: no Has patient had a PCN reaction causing severe rash involving mucus membranes or skin necrosis: no Has patient had a PCN reaction that required hospitalization yes - was in the [employer] at the time  Has patient had a PCN reaction occurring within the last 10 years:  no If all of the above answers are "NO", then may proceed with Cephalosporin use.    Family History  Problem Relation Age of Onset  . Pancreatic cancer Mother   . Heart disease Father    Social History:  reports that he quit smoking about 49 years ago. His smoking use included Cigarettes. He has a 40 pack-year smoking history. He has never used smokeless tobacco. He reports that he does not drink alcohol or use illicit drugs.  Review of Systems  Genitourinary: Positive for hematuria.  All other systems reviewed and are negative.   Physical Exam:  Vital signs in last 24 hours: Temp:  [97.7 F (36.5 C)] 97.7 F  (36.5 C) (06/05 0714) Pulse Rate:  [63] 63 (06/05 0714) Resp:  [16] 16 (06/05 0714) BP: (135)/(89) 135/89 mmHg (06/05 0714) SpO2:  [97 %] 97 % (06/05 0714) Weight:  [80.74 kg (178 lb)] 80.74 kg (178 lb) (06/05 0714) Physical Exam  Constitutional: He is oriented to person, place, and time. He appears well-developed and well-nourished.  HENT:  Head: Normocephalic and atraumatic.  Eyes: EOM are normal. Pupils are equal, round, and reactive to light.  Neck: Normal range of motion. No thyromegaly present.  Cardiovascular: Normal rate and regular rhythm.   Respiratory: Effort normal. No respiratory distress.  GI: Soft. He exhibits no distension.  Musculoskeletal: Normal range of motion.  Neurological: He is alert and oriented to person, place, and time.  Skin: Skin is warm and dry.  Psychiatric: He has a normal mood and affect. His behavior is normal. Judgment and thought content normal.    Laboratory Data:  No results found for this or any previous visit (from the past 24 hour(s)). No results found for this or any previous visit (from the past 240 hour(s)). Creatinine:  Recent Labs  04/30/16 1030  CREATININE 0.90   Baseline Creatinine: 0.9 Impression/Assessment:  82yo with gross hematuria and bladder tumor  Plan:  The risks/benefits/alternatives to TURBT was explained to the patient and he understands and wishes to proceed with surgery  Nicolette Bang 05/05/2016, 9:26 AM

## 2016-05-05 NOTE — Anesthesia Procedure Notes (Addendum)
Procedure Name: Intubation Date/Time: 05/05/2016 9:35 AM Performed by: Danley Danker L Patient Re-evaluated:Patient Re-evaluated prior to inductionOxygen Delivery Method: Circle system utilized Preoxygenation: Pre-oxygenation with 100% oxygen Intubation Type: IV induction Ventilation: Mask ventilation without difficulty and Oral airway inserted - appropriate to patient size Laryngoscope Size: Miller and 3 Grade View: Grade I Tube type: Oral Tube size: 7.5 mm Number of attempts: 1 Airway Equipment and Method: Stylet Placement Confirmation: ETT inserted through vocal cords under direct vision,  positive ETCO2 and breath sounds checked- equal and bilateral Secured at: 22 cm Tube secured with: Tape Dental Injury: Teeth and Oropharynx as per pre-operative assessment

## 2016-05-05 NOTE — Brief Op Note (Signed)
05/05/2016  10:13 AM  PATIENT:  Derrick Rivera  82 y.o. male  PRE-OPERATIVE DIAGNOSIS:  BLADDER MASS  POST-OPERATIVE DIAGNOSIS:  BLADDER MASS  PROCEDURE:  Procedure(s): TRANSURETHRAL RESECTION OF BLADDER TUMOR WITH GYRUS (TURBT-GYRUS) (N/A) CYSTOSCOPY WITH RETROGRADE PYELOGRAM (Bilateral)  SURGEON:  Surgeon(s) and Role:    * Cleon Gustin, MD - Primary  PHYSICIAN ASSISTANT:   ASSISTANTS: none   ANESTHESIA:   general  EBL:     BLOOD ADMINISTERED:none  DRAINS: Urinary Catheter (Foley), Ritgh 6x26 JJ ureteral stent without tether  LOCAL MEDICATIONS USED:  NONE  SPECIMEN:  Source of Specimen:  Right lateral wall bladder tumor  DISPOSITION OF SPECIMEN:  PATHOLOGY  COUNTS:  YES  TOURNIQUET:  * No tourniquets in log *  DICTATION: .Note written in EPIC  PLAN OF CARE: Discharge to home after PACU  PATIENT DISPOSITION:  PACU - hemodynamically stable.   Delay start of Pharmacological VTE agent (>24hrs) due to surgical blood loss or risk of bleeding: not applicable

## 2016-05-05 NOTE — Discharge Instructions (Signed)
Transurethral Resection, Bladder Tumor A cancerous growth (tumor) can develop on the inside wall of the bladder. The bladder is the organ that holds urine. One way to remove the tumor is a procedure called a transurethral resection. The tumor is removed (resected) through the tube that carries urine from the bladder out of the body (urethra). No cuts (incisions) are made in the skin. Instead, the procedure is done through a thin telescope, called a resectoscope. Attached to it is a light and usually a tiny camera. The resectoscope is put into the urethra. In men, the urethra opens at the end of the penis. In women, it opens just above the vagina.  A transurethral resection is usually used to remove tumors that have not gotten too big or too deep. These are called Stage 0, Stage 1 or Stage 2 bladder cancers. LET YOUR CAREGIVER KNOW ABOUT:  On the day of the procedure, your caregivers will need to know the last time you had anything to eat or drink. This includes water, gum, and candy. In advance, make sure they know about:  1. Any allergies. 2. All medications you are taking, including:  Herbs, eyedrops, over-the-counter medications and creams.  Blood thinners (anticoagulants), aspirin or other drugs that could affect blood clotting. 3. Use of steroids (by mouth or as creams). 4. Previous problems with anesthetics, including local anesthetics. 5. Possibility of pregnancy, if this applies. 6. Any history of blood clots. 7. Any history of bleeding or other blood problems. 8. Previous surgery. 9. Smoking history. 10. Any recent symptoms of colds or infections. 11. Other health problems. RISKS AND COMPLICATIONS This is usually a safe procedure. Every procedure has risks, though. For a transurethral resection, they include: 1. Infection. Antibiotic medication would need to be taken. 2. Bleeding. 1. Light bleeding may last for several days after the procedure. 2. If bleeding continues or is heavy,  the bladder may need rinsing. Or, a new catheter might be put in for awhile. 3. Sometimes bed rest is needed. 3. Urination problems. 1. Pain and burning can occur when urinating. This usually goes away in a few days. 2. Scarring from the procedure can block the flow of urine. 4. Bladder damage. 1. It can be punctured or torn during removal of the tumor. If this happens, a catheter might be needed for longer. Antibiotics would be taken while the bladder heals. 2. Urine can leak through the hole or tear into the abdomen. If this happens, surgery may be needed to repair the bladder. BEFORE THE PROCEDURE  1. A medical evaluation will be done. This may include: 1. A physical examination. 2. Urine test. This is to make sure you do not have a urinary tract infection. 3. Blood tests. 4. A test that checks the heart's rhythm (electrocardiogram). 5. Talking with an anesthesiologist. This is the person who will be in charge of the medication (anesthesia) to keep you from feeling pain during the transurethral resection. You might be asleep during the procedure (general anesthesia) or numb from the waist down, but awake during the procedure (spinal anesthesia). Ask your surgeon what to expect. 2. The person who is having a transurethral resection needs to give what is called informed consent. This requires signing a legal paper that gives permission for the procedure. To give informed consent: 1. You must understand how the procedure is done and why. 2. You must be told all the risks and benefits of the procedure. 3. You must sign the consent. Sometimes a legal guardian  can do this. 4. Signing should be witnessed by a healthcare professional. 3. The day before the surgery, eat only a light dinner. Then, do not eat or drink anything for at least 8 hours before the surgery. Ask your caregiver if it is OK to take any needed medicines with a sip of water. 4. Arrive at least an hour before the surgery or whenever  your surgeon recommends. This will give you time to check in and fill out any needed paperwork. PROCEDURE 1. The preparation: 1. You will change into a hospital gown. 2. A needle will be inserted in your arm. This is an intravenous access tube (IV). Medication will be able to flow directly into your body through this needle. 3. Small monitors will be put on your body. They are used to check your heart, blood pressure, and oxygen level. 4. You might be given medication that will help you relax (sedative). 5. You will be given a general anesthetic or spinal anesthesia. 2. The procedure: 1. Once you are asleep or numb from the waist down, your legs will be placed in stirrups. 2. The resectoscope will be passed through the urethra into the bladder. 3. Fluid will be passed through the resectoscope. This will fill the bladder with water. 4. The surgeon will examine the bladder through the scope. If the scope has a camera, it can take pictures from inside the bladder. They can be projected onto a TV screen. 5. The surgeon will use various tools to remove the tumor in small pieces. Sometimes a laser (a beam of light energy) is used. Other tools may use electric current. 6. A tube (catheter) will often be placed so that urine can drain into a bag outside the body. This process helps stop bleeding. This tube keeps blood clots from blocking the urethra. 7. The procedure usually takes 30 to 45 minutes. AFTER THE PROCEDURE   You will stay in a recovery area until the anesthesia has worn off. Your blood pressure and pulse will be checked every so often. Then you will be taken to a hospital room.  You may continue to get fluids through the IV for awhile.  Some pain is normal. The catheter might be uncomfortable. Pain is usually not severe. If it is, ask for pain medicine.  Your urine may look bloody after a transurethral resection. This is normal.  If bleeding is heavy, a hospital caregiver may rinse out  the bladder (irrigation) through the catheter.  Once the urine is clear, the catheter will be taken out.  You will need to stay in the hospital until you can urinate on your own.  Most people stay in the hospital for up to 4 days. PROGNOSIS   Transurethral resection is considered the best way to treat bladder tumors that are not too far along. For most people, the treatment is successful. Sometimes, though, more treatment is needed.  Bladder cancers can come back even after a successful procedure. Because of this, be sure to have a checkup with your caregiver every 3 to 6 months. If everything is OK for 3 years, you can reduce the checkups to once a year.   This information is not intended to replace advice given to you by your health care provider. Make sure you discuss any questions you have with your health care provider.   Document Released: 09/13/2009 Document Revised: 02/09/2012 Document Reviewed: 11/19/2009 Elsevier Interactive Patient Education 2016 [place], Adult A Foley catheter is  a soft, flexible tube. This tube is placed into your bladder to drain pee (urine). If you go home with this catheter in place, follow the instructions below. TAKING CARE OF THE CATHETER 12. Wash your hands with soap and water. 13. Put soap and water on a clean washcloth.  Clean the skin where the tube goes into your body.  Clean away from the tube site.  Never wipe toward the tube.  Clean the area using a circular motion.  Remove all the soap. Pat the area dry with a clean towel. For males, reposition the skin that covers the end of the penis (foreskin). 14. Attach the tube to your leg with tape or a leg strap. Do not stretch the tube tight. If you are using tape, remove any stickiness left behind by past tape you used. 15. Keep the drainage bag below your hips. Keep it off the floor. 16. Check your tube during the day. Make sure it is working and draining. Make  sure the tube does not curl, twist, or bend. 17. Do not pull on the tube or try to take it out. TAKING CARE OF THE DRAINAGE BAGS You will have a large overnight drainage bag and a small leg bag. You may wear the overnight bag any time. Never wear the small bag at night. Follow the directions below. Emptying the Drainage Bag Empty your drainage bag when it is  - full or at least 2-3 times a day. 5. Wash your hands with soap and water. 6. Keep the drainage bag below your hips. 7. Hold the dirty bag over the toilet or clean container. 8. Open the pour spout at the bottom of the bag. Empty the pee into the toilet or container. Do not let the pour spout touch anything. 9. Clean the pour spout with a gauze pad or cotton ball that has rubbing alcohol on it. 10. Close the pour spout. 11. Attach the bag to your leg with tape or a leg strap. 12. Wash your hands well. Changing the Drainage Bag Change your bag once a month or sooner if it starts to smell or look dirty.  5. Wash your hands with soap and water. 6. Pinch the rubber tube so that pee does not spill out. 7. Disconnect the catheter tube from the drainage tube at the connection valve. Do not let the tubes touch anything. 8. Clean the end of the catheter tube with an alcohol wipe. Clean the end of a the drainage tube with a different alcohol wipe. 9. Connect the catheter tube to the drainage tube of the clean drainage bag. 10. Attach the new bag to the leg with tape or a leg strap. Avoid attaching the new bag too tightly. 11. Wash your hands well. Cleaning the Drainage Bag 3. Wash your hands with soap and water. 4. Wash the bag in warm, soapy water. 5. Rinse the bag with warm water. 6. Fill the bag with a mixture of white vinegar and water (1 cup vinegar to 1 quart warm water [.2 liter vinegar to 1 liter warm water]). Close the bag and soak it for 30 minutes in the solution. 7. Rinse the bag with warm water. 8. Hang the bag to dry with the  pour spout open and hanging downward. 9. Store the clean bag (once it is dry) in a clean plastic bag. 10. Wash your hands well. PREVENT INFECTION  Wash your hands before and after touching your tube.  Take showers every day. Wash the  skin where the tube enters your body. Do not take baths. Replace wet leg straps with dry ones, if this applies.  Do not use powders, sprays, or lotions on the genital area. Only use creams, lotions, or ointments as told by your doctor.  For females, wipe from front to back after going to the bathroom.  Drink enough fluids to keep your pee clear or pale yellow unless you are told not to have too much fluid (fluid restriction).  Do not let the drainage bag or tubing touch or lie on the floor.  Wear cotton underwear to keep the area dry. GET HELP IF:  Your pee is cloudy or smells unusually bad.  Your tube becomes clogged.  You are not draining pee into the bag or your bladder feels full.  Your tube starts to leak. GET HELP RIGHT AWAY IF:  You have pain, puffiness (swelling), redness, or yellowish-white fluid (pus) where the tube enters the body.  You have pain in the belly (abdomen), legs, lower back, or bladder.  You have a fever.  You see blood fill the tube, or your pee is pink or red.  You feel sick to your stomach (nauseous), throw up (vomit), or have chills.  Your tube gets pulled out. MAKE SURE YOU:   Understand these instructions.  Will watch your condition.  Will get help right away if you are not doing well or get worse.   This information is not intended to replace advice given to you by your health care provider. Make sure you discuss any questions you have with your health care provider.   Document Released: 03/14/2013 Document Revised: 12/08/2014 Document Reviewed: 03/14/2013 Elsevier Interactive Patient Education 2016 [place] Anesthesia, Adult, Care After Refer to this sheet in the next few weeks. These  instructions provide you with information on caring for yourself after your procedure. Your health care provider may also give you more specific instructions. Your treatment has been planned according to current medical practices, but problems sometimes occur. Call your health care provider if you have any problems or questions after your procedure. WHAT TO EXPECT AFTER THE PROCEDURE After the procedure, it is typical to experience: 18. Sleepiness. 19. Nausea and vomiting. HOME CARE INSTRUCTIONS 13. For the first 24 hours after general anesthesia: 1. Have a responsible person with you. 2. Do not drive a car. If you are alone, do not take public transportation. 3. Do not drink alcohol. 4. Do not take medicine that has not been prescribed by your health care provider. 5. Do not sign important papers or make important decisions. 6. You may resume a normal diet and activities as directed by your health care provider. 14. Change bandages (dressings) as directed. 15. If you have questions or problems that seem related to general anesthesia, call the hospital and ask for the anesthetist or anesthesiologist on call. SEEK MEDICAL CARE IF: 12. You have nausea and vomiting that continue the day after anesthesia. 13. You develop a rash. SEEK IMMEDIATE MEDICAL CARE IF:  11. You have difficulty breathing. 12. You have chest pain. 13. You have any allergic problems.   This information is not intended to replace advice given to you by your health care provider. Make sure you discuss any questions you have with your health care provider.   Document Released: 02/23/2001 Document Revised: 12/08/2014 Document Reviewed: 03/17/2012 Elsevier Interactive Patient Education [employer].

## 2016-05-05 NOTE — Anesthesia Preprocedure Evaluation (Addendum)
Anesthesia Evaluation  Patient identified by MRN, date of birth, ID band Patient awake    Reviewed: Allergy & Precautions, NPO status , Patient's Chart, lab work & pertinent test results  Airway Mallampati: II  TM Distance: >3 FB Neck ROM: Full    Dental no notable dental hx.    Pulmonary sleep apnea , former smoker,    Pulmonary exam normal breath sounds clear to auscultation       Cardiovascular hypertension, Pt. on medications and Pt. on home beta blockers + CAD, + Cardiac Stents and + Peripheral Vascular Disease  Normal cardiovascular exam Rhythm:Regular Rate:Normal     Neuro/Psych negative neurological ROS  negative psych ROS   GI/Hepatic negative GI ROS, Neg liver ROS,   Endo/Other  negative endocrine ROS  Renal/GU negative Renal ROS  negative genitourinary   Musculoskeletal negative musculoskeletal ROS (+)   Abdominal   Peds negative pediatric ROS (+)  Hematology negative hematology ROS (+)   Anesthesia Other Findings   Reproductive/Obstetrics negative OB ROS                            Anesthesia Physical Anesthesia Plan  ASA: III  Anesthesia Plan: General   Post-op Pain Management:    Induction: Intravenous  Airway Management Planned: LMA  Additional Equipment:   Intra-op Plan:   Post-operative Plan: Extubation in OR  Informed Consent: I have reviewed the patients History and Physical, chart, labs and discussed the procedure including the risks, benefits and alternatives for the proposed anesthesia with the patient or authorized representative who has indicated his/her understanding and acceptance.   Dental advisory given  Plan Discussed with: CRNA and Surgeon  Anesthesia Plan Comments:         Anesthesia Quick Evaluation

## 2016-05-05 NOTE — Anesthesia Postprocedure Evaluation (Signed)
Anesthesia Post Note  Patient: Derrick Rivera  Procedure(s) Performed: Procedure(s) (LRB): TRANSURETHRAL RESECTION OF BLADDER TUMOR WITH GYRUS (TURBT-GYRUS) (N/A) CYSTOSCOPY WITH RETROGRADE PYELOGRAM (Bilateral)  Patient location during evaluation: PACU Anesthesia Type: General Level of consciousness: awake and alert Pain management: pain level controlled Vital Signs Assessment: post-procedure vital signs reviewed and stable Respiratory status: spontaneous breathing, nonlabored ventilation, respiratory function stable and patient connected to nasal cannula oxygen Cardiovascular status: blood pressure returned to baseline and stable Postop Assessment: no signs of nausea or vomiting Anesthetic complications: no    Last Vitals:  Filed Vitals:   05/05/16 0714 05/05/16 1030  BP: 135/89 112/78  Pulse: 63 68  Temp: 36.5 C 36.5 C  Resp: 16 11    Last Pain:  Filed Vitals:   05/05/16 1038  PainSc: 0-No pain                 Asier Desroches S

## 2016-05-05 NOTE — Transfer of Care (Signed)
Immediate Anesthesia Transfer of Care Note  Patient: Derrick Rivera  Procedure(s) Performed: Procedure(s): TRANSURETHRAL RESECTION OF BLADDER TUMOR WITH GYRUS (TURBT-GYRUS) (N/A) CYSTOSCOPY WITH RETROGRADE PYELOGRAM (Bilateral)  Patient Location: PACU  Anesthesia Type:General  Level of Consciousness: awake, alert  and oriented  Airway & Oxygen Therapy: Patient Spontanous Breathing and Patient connected to face mask oxygen  Post-op Assessment: Report given to RN and Post -op Vital signs reviewed and stable  Post vital signs: Reviewed and stable  Last Vitals:  Filed Vitals:   05/05/16 0714  BP: 135/89  Pulse: 63  Temp: 36.5 C  Resp: 16    Last Pain: There were no vitals filed for this visit.    Patients Stated Pain Goal: 4 ([id number])  Complications: No apparent anesthesia complications

## 2016-05-08 NOTE — Op Note (Signed)
Preoperative diagnosis: Bladder Tumor  Postop diagnosis: Same  Procedure: 1.  Cystoscopy 2. Bilateral retrograde pyelography 3. Intra-operative fluoroscopy, under 1 hour, with interpretation 4.Transurethral resection of bladder tumor, large 5. Right ureteral Stent Placement     Attending: Nicolette Bang  Anesthesia: General  Estimated blood loss: 5 cc  Drains: 1. 18 French Foley catheter 2. 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 Normal bilateral retrograde pyelograms  Indications: Patient is a 82 year old with a history of  bladder tumor found on office cystoscopy.   After discussing treatment options patient decided to proceed with transurethral resection of bladder tumor  Procedure in detail: Prior to procedure consetn was obtained. Patient was brought to the operating room and briefing was done sure correct patient, correct procedure, correct site.  General anesthesia was in administered patient was placed in the dorsal lithotomy position.  The rigid 23 French cystoscope was passed urethra and bladder.  Bladder was inspected masses or lesions and we noted a 6 cm lesion on the right lateral wall of the bladder involving the ureteral orifice. We then turned our attention to the left ureteral orifice.  The left ureteral orifice was cannulated with a 6 French ureteral catheter.  A gentle retrograde was obtained in findings noted above. Removed the cystoscope and placed a 28 French resectoscope in the bladder.  Using bipolar electrocautery were then removed the 2 bladder tumors.  We removed the bladder tumors down to the base exposing muscle.  We then removed the pieces and sent them for pathology.  To obtain hemostasis we then cauterized the bed of the tumors. We were then able to identify the right ureteral orifice. We then cannulated the right ureteral orifice with a 6  French ureteral catheter.  A gentle retrograde was obtained in findings noted above.    We then placed a zip wire through the ureteral catheter and advanced up to the renal pelvis. We then placed a 6 x 26 double-J ureteral stent over the wire.  We then removed the wire and good coil was noted in the pelvis under fluoroscopy in the bladder under direct vision.   Once good hemostasis was noted the bladder was then drained and a 18 French Foley catheter was placed. This concluded the procedure which resulted by the patient.  Complications: None  Condition: Stable,  extubated, transferred to PACU.  Plan: The patient is to be discharged home. He is to followup in 5 days for foley removal.

## 2016-05-12 ENCOUNTER — Other Ambulatory Visit: Payer: Self-pay | Admitting: Urology

## 2016-05-12 DIAGNOSIS — C672 Malignant neoplasm of lateral wall of bladder: Secondary | ICD-10-CM | POA: Diagnosis not present

## 2016-05-13 ENCOUNTER — Telehealth: Payer: Self-pay | Admitting: Cardiovascular Disease

## 2016-05-13 DIAGNOSIS — I25810 Atherosclerosis of coronary artery bypass graft(s) without angina pectoris: Secondary | ICD-10-CM

## 2016-05-13 DIAGNOSIS — Z79899 Other long term (current) drug therapy: Secondary | ICD-10-CM

## 2016-05-13 DIAGNOSIS — Z0000 Encounter for general adult medical examination without abnormal findings: Secondary | ICD-10-CM

## 2016-05-13 DIAGNOSIS — I10 Essential (primary) hypertension: Secondary | ICD-10-CM

## 2016-05-13 NOTE — Telephone Encounter (Signed)
New Message  Pt calling to discuss possible lab work to be done before recall- no labs in epic- pt wanted to discus w/RN. Please call back and discuss.

## 2016-05-13 NOTE — Telephone Encounter (Signed)
Called patient. Has upcoming appt in August- wanted lab orders sent to him in advance of appt.  CBC, CMET, Lipid panel, TSH & PSA checked in October 2016.  Sending to Dr. [name] to verify OK to repeat PSA w/ other tests.

## 2016-05-20 NOTE — Telephone Encounter (Signed)
[place] for labs including PSA

## 2016-05-20 NOTE — Telephone Encounter (Signed)
Returned call to pt. Lab slips mailed to patient. He verbalized understanding to be fasting for lab work. Pt also inquired about stress test. Ordered entered into EPIC per office visit note: Your physician recommends that you schedule a follow-up appointment and lexiscan myoview in AUGUST 2017.

## 2016-05-27 DIAGNOSIS — C4441 Basal cell carcinoma of skin of scalp and neck: Secondary | ICD-10-CM | POA: Diagnosis not present

## 2016-05-27 DIAGNOSIS — C44319 Basal cell carcinoma of skin of other parts of face: Secondary | ICD-10-CM | POA: Diagnosis not present

## 2016-05-27 DIAGNOSIS — C4442 Squamous cell carcinoma of skin of scalp and neck: Secondary | ICD-10-CM | POA: Diagnosis not present

## 2016-05-27 DIAGNOSIS — Z85828 Personal history of other malignant neoplasm of skin: Secondary | ICD-10-CM | POA: Diagnosis not present

## 2016-05-27 DIAGNOSIS — Z08 Encounter for follow-up examination after completed treatment for malignant neoplasm: Secondary | ICD-10-CM | POA: Diagnosis not present

## 2016-05-29 ENCOUNTER — Encounter (HOSPITAL_BASED_OUTPATIENT_CLINIC_OR_DEPARTMENT_OTHER): Payer: Self-pay | Admitting: *Deleted

## 2016-05-29 DIAGNOSIS — R69 Illness, unspecified: Secondary | ICD-10-CM | POA: Diagnosis not present

## 2016-05-29 NOTE — Progress Notes (Signed)
NPO AFTER MN.  ARRIVE AT [id number].  NEEDS ISTAT 8.  CURRENT EKG IN CHART AND EPIC.  WILL TAKE IMDUR AND ATENOLOL AM DOS W/ SIPS OF WATER.

## 2016-06-06 ENCOUNTER — Other Ambulatory Visit: Payer: Self-pay | Admitting: Cardiovascular Disease

## 2016-06-09 ENCOUNTER — Ambulatory Visit (HOSPITAL_BASED_OUTPATIENT_CLINIC_OR_DEPARTMENT_OTHER): Payer: Medicare HMO | Admitting: Anesthesiology

## 2016-06-09 ENCOUNTER — Encounter (HOSPITAL_BASED_OUTPATIENT_CLINIC_OR_DEPARTMENT_OTHER)
Admission: RE | Disposition: A | Discharge status: Home / Self Care | Payer: Self-pay | Source: Ambulatory Visit | Attending: Urology

## 2016-06-09 ENCOUNTER — Encounter (HOSPITAL_BASED_OUTPATIENT_CLINIC_OR_DEPARTMENT_OTHER): Payer: Self-pay | Admitting: *Deleted

## 2016-06-09 ENCOUNTER — Ambulatory Visit (HOSPITAL_BASED_OUTPATIENT_CLINIC_OR_DEPARTMENT_OTHER)
Admission: RE | Admit: 2016-06-09 | Discharge: 2016-06-09 | Disposition: A | Discharge status: Home / Self Care | Payer: Medicare HMO | Source: Ambulatory Visit | Attending: Urology | Admitting: Urology

## 2016-06-09 DIAGNOSIS — I10 Essential (primary) hypertension: Secondary | ICD-10-CM | POA: Diagnosis not present

## 2016-06-09 DIAGNOSIS — I739 Peripheral vascular disease, unspecified: Secondary | ICD-10-CM | POA: Insufficient documentation

## 2016-06-09 DIAGNOSIS — Z96612 Presence of left artificial shoulder joint: Secondary | ICD-10-CM | POA: Diagnosis not present

## 2016-06-09 DIAGNOSIS — Z96652 Presence of left artificial knee joint: Secondary | ICD-10-CM | POA: Insufficient documentation

## 2016-06-09 DIAGNOSIS — G4733 Obstructive sleep apnea (adult) (pediatric): Secondary | ICD-10-CM | POA: Diagnosis not present

## 2016-06-09 DIAGNOSIS — I2510 Atherosclerotic heart disease of native coronary artery without angina pectoris: Secondary | ICD-10-CM | POA: Diagnosis not present

## 2016-06-09 DIAGNOSIS — Z85828 Personal history of other malignant neoplasm of skin: Secondary | ICD-10-CM | POA: Insufficient documentation

## 2016-06-09 DIAGNOSIS — C679 Malignant neoplasm of bladder, unspecified: Secondary | ICD-10-CM | POA: Diagnosis not present

## 2016-06-09 DIAGNOSIS — Z87891 Personal history of nicotine dependence: Secondary | ICD-10-CM | POA: Insufficient documentation

## 2016-06-09 DIAGNOSIS — Z955 Presence of coronary angioplasty implant and graft: Secondary | ICD-10-CM | POA: Diagnosis not present

## 2016-06-09 DIAGNOSIS — C676 Malignant neoplasm of ureteric orifice: Secondary | ICD-10-CM | POA: Insufficient documentation

## 2016-06-09 DIAGNOSIS — Z8572 Personal history of non-Hodgkin lymphomas: Secondary | ICD-10-CM | POA: Insufficient documentation

## 2016-06-09 DIAGNOSIS — Z7982 Long term (current) use of aspirin: Secondary | ICD-10-CM | POA: Diagnosis not present

## 2016-06-09 DIAGNOSIS — Z79899 Other long term (current) drug therapy: Secondary | ICD-10-CM | POA: Diagnosis not present

## 2016-06-09 DIAGNOSIS — E7800 Pure hypercholesterolemia, unspecified: Secondary | ICD-10-CM | POA: Insufficient documentation

## 2016-06-09 DIAGNOSIS — C672 Malignant neoplasm of lateral wall of bladder: Secondary | ICD-10-CM | POA: Diagnosis not present

## 2016-06-09 HISTORY — DX: Obstructive sleep apnea (adult) (pediatric): G47.33

## 2016-06-09 HISTORY — DX: Presence of spectacles and contact lenses: Z97.3

## 2016-06-09 HISTORY — DX: Malignant neoplasm of bladder, unspecified: C67.9

## 2016-06-09 HISTORY — DX: Atherosclerotic heart disease of native coronary artery without angina pectoris: I25.10

## 2016-06-09 HISTORY — DX: Personal history of adenomatous and serrated colon polyps: Z86.0101

## 2016-06-09 HISTORY — DX: Personal history of transient ischemic attack (TIA), and cerebral infarction without residual deficits: Z86.73

## 2016-06-09 HISTORY — DX: Nodular prostate without lower urinary tract symptoms: N40.2

## 2016-06-09 HISTORY — DX: Non-Hodgkin lymphoma, unspecified, unspecified site: C85.90

## 2016-06-09 HISTORY — DX: Personal history of colonic polyps: Z86.010

## 2016-06-09 HISTORY — DX: Unspecified osteoarthritis, unspecified site: M19.90

## 2016-06-09 HISTORY — DX: Personal history of other malignant neoplasm of skin: Z85.828

## 2016-06-09 HISTORY — DX: Occlusion and stenosis of bilateral carotid arteries: I65.23

## 2016-06-09 HISTORY — PX: TRANSURETHRAL RESECTION OF BLADDER TUMOR WITH GYRUS (TURBT-GYRUS): SHX6458

## 2016-06-09 LAB — POCT I-STAT, CHEM 8
BUN: 20 mg/dL (ref 6–20)
CALCIUM ION: 1.24 mmol/L — AB (ref 1.12–1.23)
CHLORIDE: 104 mmol/L (ref 101–111)
CREATININE: 0.9 mg/dL (ref 0.61–1.24)
GLUCOSE: 95 mg/dL (ref 65–99)
HCT: 42 % (ref 39.0–52.0)
Hemoglobin: 14.3 g/dL (ref 13.0–17.0)
POTASSIUM: 4.5 mmol/L (ref 3.5–5.1)
Sodium: 141 mmol/L (ref 135–145)
TCO2: 25 mmol/L (ref 0–100)

## 2016-06-09 SURGERY — TRANSURETHRAL RESECTION OF BLADDER TUMOR WITH GYRUS (TURBT-GYRUS)
Anesthesia: General

## 2016-06-09 MED ORDER — EPHEDRINE SULFATE 50 MG/ML IJ SOLN
INTRAMUSCULAR | Status: DC | PRN
  Administered 2016-06-09 (×2): 15 mg via INTRAVENOUS
  Administered 2016-06-09: 10 mg via INTRAVENOUS

## 2016-06-09 MED ORDER — ONDANSETRON HCL 4 MG/2ML IJ SOLN
INTRAMUSCULAR | Status: AC
  Filled 2016-06-09: qty 2

## 2016-06-09 MED ORDER — PHENYLEPHRINE HCL 10 MG/ML IJ SOLN
INTRAMUSCULAR | Status: DC | PRN
  Administered 2016-06-09 (×2): 80 ug via INTRAVENOUS

## 2016-06-09 MED ORDER — SUCCINYLCHOLINE CHLORIDE 200 MG/10ML IV SOSY
PREFILLED_SYRINGE | INTRAVENOUS | Status: DC | PRN
  Administered 2016-06-09: 100 mg via INTRAVENOUS

## 2016-06-09 MED ORDER — FENTANYL CITRATE (PF) 100 MCG/2ML IJ SOLN
25.0000 ug | INTRAMUSCULAR | Status: DC | PRN
  Filled 2016-06-09: qty 1

## 2016-06-09 MED ORDER — PROMETHAZINE HCL 25 MG/ML IJ SOLN
6.2500 mg | INTRAMUSCULAR | Status: DC | PRN
  Filled 2016-06-09: qty 1

## 2016-06-09 MED ORDER — FENTANYL CITRATE (PF) 100 MCG/2ML IJ SOLN
INTRAMUSCULAR | Status: DC | PRN
  Administered 2016-06-09: 50 ug via INTRAVENOUS

## 2016-06-09 MED ORDER — SUGAMMADEX SODIUM 200 MG/2ML IV SOLN
INTRAVENOUS | Status: DC | PRN
  Administered 2016-06-09: 160 mg via INTRAVENOUS

## 2016-06-09 MED ORDER — PROPOFOL 10 MG/ML IV BOLUS
INTRAVENOUS | Status: DC | PRN
  Administered 2016-06-09: 100 mg via INTRAVENOUS

## 2016-06-09 MED ORDER — DEXAMETHASONE SODIUM PHOSPHATE 10 MG/ML IJ SOLN
INTRAMUSCULAR | Status: AC
  Filled 2016-06-09: qty 1

## 2016-06-09 MED ORDER — ROCURONIUM BROMIDE 100 MG/10ML IV SOLN
INTRAVENOUS | Status: AC
  Filled 2016-06-09: qty 1

## 2016-06-09 MED ORDER — PHENYLEPHRINE 40 MCG/ML (10ML) SYRINGE FOR IV PUSH (FOR BLOOD PRESSURE SUPPORT)
PREFILLED_SYRINGE | INTRAVENOUS | Status: AC
  Filled 2016-06-09: qty 10

## 2016-06-09 MED ORDER — TRAMADOL HCL 50 MG PO TABS: 50.0000 mg | ORAL_TABLET | Freq: Four times a day (QID) | ORAL | Status: DC | PRN

## 2016-06-09 MED ORDER — PROPOFOL 10 MG/ML IV BOLUS
INTRAVENOUS | Status: AC
  Filled 2016-06-09: qty 20

## 2016-06-09 MED ORDER — LIDOCAINE HCL (CARDIAC) 20 MG/ML IV SOLN
INTRAVENOUS | Status: DC | PRN
  Administered 2016-06-09: 60 mg via INTRAVENOUS

## 2016-06-09 MED ORDER — DEXTROSE 5 % IV SOLN
2.0000 g | INTRAVENOUS | Status: AC
  Administered 2016-06-09: 2 g via INTRAVENOUS
  Filled 2016-06-09: qty 2

## 2016-06-09 MED ORDER — ROCURONIUM BROMIDE 100 MG/10ML IV SOLN
INTRAVENOUS | Status: DC | PRN
  Administered 2016-06-09: 20 mg via INTRAVENOUS

## 2016-06-09 MED ORDER — CEFTRIAXONE SODIUM 2 G IJ SOLR
INTRAMUSCULAR | Status: AC
  Filled 2016-06-09: qty 2

## 2016-06-09 MED ORDER — EPHEDRINE SULFATE 50 MG/ML IJ SOLN
INTRAMUSCULAR | Status: AC
  Filled 2016-06-09: qty 1

## 2016-06-09 MED ORDER — FENTANYL CITRATE (PF) 100 MCG/2ML IJ SOLN
INTRAMUSCULAR | Status: AC
  Filled 2016-06-09: qty 2

## 2016-06-09 MED ORDER — SODIUM CHLORIDE 0.9 % IR SOLN
Status: DC | PRN
  Administered 2016-06-09: 3000 mL via INTRAVESICAL

## 2016-06-09 MED ORDER — DEXTROSE 5 % IV SOLN
INTRAVENOUS | Status: AC
  Filled 2016-06-09: qty 50

## 2016-06-09 MED ORDER — SUGAMMADEX SODIUM 200 MG/2ML IV SOLN
INTRAVENOUS | Status: AC
  Filled 2016-06-09: qty 2

## 2016-06-09 MED ORDER — ONDANSETRON HCL 4 MG/2ML IJ SOLN
INTRAMUSCULAR | Status: DC | PRN
  Administered 2016-06-09: 4 mg via INTRAVENOUS

## 2016-06-09 MED ORDER — DEXAMETHASONE SODIUM PHOSPHATE 4 MG/ML IJ SOLN
INTRAMUSCULAR | Status: DC | PRN
  Administered 2016-06-09: 5 mg via INTRAVENOUS

## 2016-06-09 MED ORDER — LACTATED RINGERS IV SOLN
INTRAVENOUS | Status: DC
  Administered 2016-06-09 (×2): via INTRAVENOUS
  Filled 2016-06-09: qty 1000

## 2016-06-09 SURGICAL SUPPLY — 24 items
BAG DRAIN URO-CYSTO SKYTR STRL (DRAIN) ×2 IMPLANT
BAG URINE LEG 500ML (DRAIN) ×2 IMPLANT
CATH FOLEY 2WAY SLVR  5CC 22FR (CATHETERS)
CATH FOLEY 2WAY SLVR 30CC 20FR (CATHETERS) IMPLANT
CATH FOLEY 2WAY SLVR 30CC 22FR (CATHETERS) ×2 IMPLANT
CATH FOLEY 2WAY SLVR 5CC 22FR (CATHETERS) IMPLANT
CATH FOLEY 3WAY 30CC 22F (CATHETERS) IMPLANT
CLOTH BEACON ORANGE TIMEOUT ST (SAFETY) ×2 IMPLANT
ELECT LOOP 22F BIPOLAR SML (ELECTROSURGICAL) ×2
ELECT REM PT RETURN 9FT ADLT (ELECTROSURGICAL)
ELECTRODE LOOP 22F BIPOLAR SML (ELECTROSURGICAL) ×1 IMPLANT
ELECTRODE REM PT RTRN 9FT ADLT (ELECTROSURGICAL) IMPLANT
GLOVE BIO SURGEON STRL SZ8 (GLOVE) ×2 IMPLANT
GOWN STRL REUS W/ TWL LRG LVL3 (GOWN DISPOSABLE) ×1 IMPLANT
GOWN STRL REUS W/TWL LRG LVL3 (GOWN DISPOSABLE) ×1
KIT ROOM TURNOVER WOR (KITS) ×2 IMPLANT
MANIFOLD NEPTUNE II (INSTRUMENTS) ×2 IMPLANT
PACK CYSTO (CUSTOM PROCEDURE TRAY) ×2 IMPLANT
PLUG CATH AND CAP STER (CATHETERS) ×2 IMPLANT
SET ASPIRATION TUBING (TUBING) IMPLANT
SYR 20CC LL (SYRINGE) IMPLANT
SYR 30ML LL (SYRINGE) ×2 IMPLANT
SYRINGE IRR TOOMEY STRL 70CC (SYRINGE) IMPLANT
TUBE CONNECTING 12X1/4 (SUCTIONS) ×2 IMPLANT

## 2016-06-09 NOTE — Discharge Instructions (Addendum)
HOME CARE INSTRUCTION   Activity: Rest for the remainder of the day.  Do not drive or operate equipment today.  You may resume normal activities in one to two days as instructed by your physician.   Meals: Drink plenty of liquids and eat light foods such as gelatin or soup this evening.  You may return to a normal meal plan tomorrow.  Return to Work: You may return to work in one to two days or as instructed by your physician.  Special Instructions / Symptoms: Call your physician if any of these symptoms occur:   -persistent or heavy bleeding  -bleeding which continues after first few urination  -large blood clots that are difficult to pass  -urine stream diminishes or stops completely  -fever equal to or higher than 101 degrees Farenheit.  -cloudy urine with a strong, foul odor  -severe pain  Females should always wipe from front to back after elimination.  You may feel some burning pain when you urinate.  This should disappear with time.  Applying moist heat to the lower abdomen or a hot tub bath may help relieve the pa  Foley Catheter Care, Adult A Foley catheter is a soft, flexible tube that is placed into the bladder to drain urine. A Foley catheter may be inserted if:  You leak urine or are not able to control when you urinate (urinary incontinence).  You are not able to urinate when you need to (urinary retention).  You had prostate surgery or surgery on the genitals.  You have certain medical conditions, such as multiple sclerosis, dementia, or a spinal cord injury. If you are going home with a Foley catheter in place, follow the instructions below. TAKING CARE OF THE CATHETER 1. Wash your hands with soap and water. 2. Using mild soap and warm water on a clean washcloth:  Clean the area on your body closest to the catheter insertion site using a circular motion, moving away from the catheter. Never wipe toward the catheter because this could sweep bacteria up into  the urethra and cause infection.  Remove all traces of soap. Pat the area dry with a clean towel. For males, reposition the foreskin. 3. Attach the catheter to your leg so there is no tension on the catheter. Use adhesive tape or a leg strap. If you are using adhesive tape, remove any sticky residue left behind by the previous tape you used. 4. Keep the drainage bag below the level of the bladder, but keep it off the floor. 5. Check throughout the day to be sure the catheter is working and urine is draining freely. Make sure the tubing does not become kinked. 6. Do not pull on the catheter or try to remove it. Pulling could damage internal tissues. TAKING CARE OF THE DRAINAGE BAGS You will be given two drainage bags to take home. One is a large overnight drainage bag, and the other is a smaller leg bag that fits underneath clothing. You may wear the overnight bag at any time, but you should never wear the smaller leg bag at night. Follow the instructions below for how to empty, change, and clean your drainage bags. Emptying the Drainage Bag You must empty your drainage bag when it is  - full or at least 2-3 times a day. 1. Wash your hands with soap and water. 2. Keep the drainage bag below your hips, below the level of your bladder. This stops urine from going back into the tubing and into  your bladder. 3. Hold the dirty bag over the toilet or a clean container. 4. Open the pour spout at the bottom of the bag and empty the urine into the toilet or container. Do not let the pour spout touch the toilet, container, or any other surface. Doing so can place bacteria on the bag, which can cause an infection. 5. Clean the pour spout with a gauze pad or cotton ball that has rubbing alcohol on it. 6. Close the pour spout. 7. Attach the bag to your leg with adhesive tape or a leg strap. 8. Wash your hands well. Changing the Drainage Bag Change your drainage bag once a month or sooner if it starts to smell  bad or look dirty. Below are steps to follow when changing the drainage bag. 1. Wash your hands with soap and water. 2. Pinch off the rubber catheter so that urine does not spill out. 3. Disconnect the catheter tube from the drainage tube at the connection valve. Do not let the tubes touch any surface. 4. Clean the end of the catheter tube with an alcohol wipe. Use a different alcohol wipe to clean the end of the drainage tube. 5. Connect the catheter tube to the drainage tube of the clean drainage bag. 6. Attach the new bag to the leg with adhesive tape or a leg strap. Avoid attaching the new bag too tightly. 7. Wash your hands well. Cleaning the Drainage Bag 1. Wash your hands with soap and water. 2. Wash the bag in warm, soapy water. 3. Rinse the bag thoroughly with warm water. 4. Fill the bag with a solution of white vinegar and water (1 cup vinegar to 1 qt warm water [.2 L vinegar to 1 L warm water]). Close the bag and soak it for 30 minutes in the solution. 5. Rinse the bag with warm water. 6. Hang the bag to dry with the pour spout open and hanging downward. 7. Store the clean bag (once it is dry) in a clean plastic bag. 8. Wash your hands well. PREVENTING INFECTION  Wash your hands before and after handling your catheter.  Take showers daily and wash the area where the catheter enters your body. Do not take baths. Replace wet leg straps with dry ones, if this applies.  Do not use powders, sprays, or lotions on the genital area. Only use creams, lotions, or ointments as directed by your caregiver.  For females, wipe from front to back after each bowel movement.  Drink enough fluids to keep your urine clear or pale yellow unless you have a fluid restriction.  Do not let the drainage bag or tubing touch or lie on the floor.  Wear cotton underwear to absorb moisture and to keep your skin drier. SEEK MEDICAL CARE IF:   Your urine is cloudy or smells unusually bad.  Your  catheter becomes clogged.  You are not draining urine into the bag or your bladder feels full.  Your catheter starts to leak. SEEK IMMEDIATE MEDICAL CARE IF:   You have pain, swelling, redness, or pus where the catheter enters the body.  You have pain in the abdomen, legs, lower back, or bladder.  You have a fever.  You see blood fill the catheter, or your urine is pink or red.  You have nausea, vomiting, or chills.  Your catheter gets pulled out. MAKE SURE YOU:   Understand these instructions.  Will watch your condition.  Will get help right away if you are not  doing well or get worse.   This information is not intended to replace advice given to you by your health care provider. Make sure you discuss any questions you have with your health care provider.   Document Released: 11/17/2005 Document Revised: 04/03/2014 Document Reviewed: 11/08/2012 Elsevier Interactive Patient Education [employer].

## 2016-06-09 NOTE — H&P (Signed)
Urology Admission H&P  Chief Complaint: gross hematuria  History of Present Illness: Derrick Rivera is a 82yo with a hx of High grade NMIBC here for repeat TURBT. His tumor involved the right ureteral orifice and he had a stent placed at the time of TURBT. He has intermittent gross hematuria  Past Medical History  Diagnosis Date  . Allergic rhinitis   . HTN (hypertension)   . Hypercholesterolemia   . Diverticulosis of colon   . History of colonic polyps   . Thrombocytopenia (HCC)     chronic mild  . OSA (obstructive sleep apnea)     moderate per study 08-10-2013--  intolerant cpap  . Bilateral carotid artery stenosis     per last duplex [id number]  RICA [id number],  LICA [id number]  . History of adenomatous polyp of colon     tubular adenoma's 1992; 1993; 1997; 2007  . Prostate nodule   . OA (osteoarthritis)   . Coronary artery disease cardiologiat-  dr Shelva Majestic    hx PCI and stenting 2003/  per cardiac cath total occlusion RCA w/ collaterals 1995  . Bladder cancer [facility]) urologist-  dr Alyson Ingles    Non-invasise High Grade  . Non-Hodgkin lymphoma, unspecified, unspecified site [facility]) oncologist-  dr Benay Spice--  pt in clinical remission since 2000    dx 1996  no specific therapy since 2000  . History of nonmelanoma skin cancer     BCC and SCC right side nose  s/p moh's reseciton and reconstruction 2013/  left ear  01/ 2017/   06/ 2017 in office excision 2 areas on scalp  . History of TIAs takes ASA    pt states has a few what he thinks ia tia's, states last time with symptoms of veritgo and thought process was 04/ 2017  . Wears glasses    Past Surgical History  Procedure Laterality Date  . Total knee arthroplasty Left 03-07-2002  . Total shoulder arthroplasty Left 04-30-2005  . Transurethral resection of bladder tumor with gyrus (turbt-gyrus) N/A 05/05/2016    Procedure: TRANSURETHRAL RESECTION OF BLADDER TUMOR WITH GYRUS (TURBT-GYRUS);  Surgeon: Cleon Gustin, MD;  Location: [facility]S;   Service: Urology;  Laterality: N/A;  . Cystoscopy/retrograde/ureteroscopy/stone extraction with basket Bilateral 05/05/2016    Procedure: CYSTOSCOPY WITH RETROGRADE PYELOGRAM;  Surgeon: Cleon Gustin, MD;  Location: [facility];  Service: Urology;  Laterality: Bilateral;  . Transthoracic echocardiogram  04-29-2012    borderline inferior wall hypokinesis, ef 65%/  mild dilated RV/  mild to moderate AV calcification without stenosis/  mild AR, TR, and  Derrick/  moderate PR/  mild aortic root dilatation  . Cardiovascular stress test  07-06-2013  dr Shelva Majestic    Low risk lexiscan nuclear study w/ a fixed inferolateral defect consistent with prior LCX territory scar,  no reversible ishemia/  mild inferolateral hypokinesis, ef 57% and normal LV wall motion   . Mohs surgery  2013   at [facility]    nose -- w/ Reconstructive surgery same year  . Coronary angioplasty  1995    total occlusion RCA w/ collaterals;  PTCA 2nd marginal LCFX  . Coronary angioplasty with stent placement  01-05-2002  dr Shelva Majestic    Complex high-speed rotational atherectomy of entire LAD multiple sites and stenting proximal and mid LAD/  no re-stenosis in prior CFX marginal dilatation site and RCA occluded w/ left to right collaterals,  ef 57%  . Cataract extraction w/ intraocular lens  implant, bilateral  2013  .  Refractive surgery Left 05/ 2017  . Mohs surgery  01/ 2017    left ear  . Tonsillectomy  age 59    Home Medications:  Prescriptions prior to admission  Medication Sig Dispense Refill Last Dose  . Ascorbic Acid (VITAMIN C) 500 MG tablet Take 500 mg by mouth every morning.    06/08/2016 at Unknown time  . aspirin EC 81 MG tablet Take 81 mg by mouth every evening.   Past Month at Unknown time  . atenolol (TENORMIN) 50 MG tablet Take 0.5 tablets (25 mg total) by mouth 2 (two) times daily. 60 tablet 11 06/09/2016 at 0800  . atorvastatin (LIPITOR) 20 MG tablet TAKE 1 TABLET (20 MG TOTAL) BY MOUTH DAILY. (Patient taking  differently: TAKE 1 TABLET (20 MG TOTAL) BY MOUTH AT BEDTIME.) 90 tablet 2 06/08/2016 at Unknown time  . B Complex-C-Calcium (B-COMPLEX/VITAMIN C) TABS Take one tablet by mouth every other day  In AM   Past Week at Unknown time  . fish oil-omega-3 fatty acids 1000 MG capsule Take one capsule by mouth daily   Past Month at Unknown time  . isosorbide mononitrate (IMDUR) 60 MG 24 hr tablet TAKE 1 TABLET (60 MG TOTAL) BY MOUTH DAILY. 90 tablet 0 06/09/2016 at 0800  . lisinopril (PRINIVIL,ZESTRIL) 10 MG tablet TAKE 1 TABLET (10 MG TOTAL) BY MOUTH DAILY. (Patient taking differently: TAKE 1 TABLET (10 MG TOTAL) BY MOUTH DAILY at 12 NOON. (lunch)) 90 tablet 1 06/08/2016 at Unknown time  . Multiple Vitamins-Minerals (MULTIVITAMIN WITH MINERALS) tablet Take one tablet by mouth every other day  In AM   06/08/2016 at Unknown time  . verapamil (CALAN) 120 MG tablet TAKE 1 TABLET (120 MG TOTAL) BY MOUTH ONCE DAILY (Patient taking differently: TAKE 1 TABLET (120 MG TOTAL) BY MOUTH At BEDTIME.) 30 tablet 6 05/04/2016 at Unknown time  . traMADol (ULTRAM) 50 MG tablet Take 1 tablet (50 mg total) by mouth every 6 (six) hours as needed. 30 tablet 0 Unknown at Unknown time   Allergies:  Allergies  Allergen Reactions  . Penicillins Other (See Comments)    Did not help with strep throat--Unknown reaction  Has patient had a PCN reaction causing immediate rash, facial/tongue/throat swelling, SOB or lightheadedness with hypotension: no Has patient had a PCN reaction causing severe rash involving mucus membranes or skin necrosis: no Has patient had a PCN reaction that required hospitalization yes - was in the [employer] at the time Has patient had a PCN reaction occurring within the last 10 years:  no If all of the above answers are "NO", then may proceed with    Family History  Problem Relation Age of Onset  . Pancreatic cancer Mother   . Heart disease Father    Social History:  reports that he quit smoking about 49 years ago. His  smoking use included Cigarettes. He has a 40 pack-year smoking history. He has never used smokeless tobacco. He reports that he does not drink alcohol or use illicit drugs.  Review of Systems  Genitourinary: Positive for dysuria, hematuria and flank pain.  All other systems reviewed and are negative.   Physical Exam:  Vital signs in last 24 hours: Temp:  [97.9 F (36.6 C)] 97.9 F (36.6 C) (07/10 1047) Pulse Rate:  [62] 62 (07/10 1047) Resp:  [16] 16 (07/10 1047) BP: (130)/(72) 130/72 mmHg (07/10 1047) SpO2:  [98 %] 98 % (07/10 1047) Weight:  [78.699 kg (173 lb 8 oz)] 78.699 kg (173 lb  8 oz) (07/10 1047) Physical Exam  Constitutional: He is oriented to person, place, and time. He appears well-developed and well-nourished.  HENT:  Head: Normocephalic and atraumatic.  Eyes: EOM are normal. Pupils are equal, round, and reactive to light.  Neck: Normal range of motion. No thyromegaly present.  Cardiovascular: Normal rate and regular rhythm.   Respiratory: Effort normal. No respiratory distress.  GI: Soft. He exhibits no distension.  Musculoskeletal: Normal range of motion.  Neurological: He is alert and oriented to person, place, and time.  Skin: Skin is warm and dry.  Psychiatric: He has a normal mood and affect. His behavior is normal. Judgment and thought content normal.    Laboratory Data:  Results for orders placed or performed during the hospital encounter of 06/09/16 (from the past 24 hour(s))  I-STAT, chem 8     Status: Abnormal   Collection Time: 06/09/16 11:30 AM  Result Value Ref Range   Sodium 141 135 - 145 mmol/L   Potassium 4.5 3.5 - 5.1 mmol/L   Chloride 104 101 - 111 mmol/L   BUN 20 6 - 20 mg/dL   Creatinine, Ser 0.90 0.61 - 1.24 mg/dL   Glucose, Bld 95 65 - 99 mg/dL   Calcium, Ion 1.24 (H) 1.12 - 1.23 mmol/L   TCO2 25 0 - 100 mmol/L   Hemoglobin 14.3 13.0 - 17.0 g/dL   HCT 42.0 39.0 - 52.0 %   No results found for this or any previous visit (from the past  240 hour(s)). Creatinine:  Recent Labs  06/09/16 1130  CREATININE 0.90   Baseline Creatinine: 0.9  Impression/Assessment:  82yo with high grade bladder cancer  Plan:  The risks/benefits/alternatives to TURBT was explained to the patient and he understands and wishes to proceed with surgery  Nicolette Bang 06/09/2016, 12:13 PM

## 2016-06-09 NOTE — Transfer of Care (Signed)
Last Vitals:  Filed Vitals:   06/09/16 1047  BP: 130/72  Pulse: 62  Temp: 36.6 C  Resp: 16    Last Pain: There were no vitals filed for this visit.    Patients Stated Pain Goal: 7 (06/09/16 1107)  Immediate Anesthesia Transfer of Care Note  Patient: Derrick Rivera  Procedure(s) Performed: Procedure(s) (LRB): TRANSURETHRAL RESECTION OF BLADDER TUMOR WITH GYRUS (TURBT-GYRUS) (N/A)  Patient Location: PACU  Anesthesia Type: General  Level of Consciousness: awake, alert  and oriented  Airway & Oxygen Therapy: Patient Spontanous Breathing and Patient connected to face mask oxygen  Post-op Assessment: Report given to PACU RN and Post -op Vital signs reviewed and stable  Post vital signs: Reviewed and stable  Complications: No apparent anesthesia complications

## 2016-06-09 NOTE — Anesthesia Procedure Notes (Signed)
Procedure Name: Intubation Date/Time: 06/09/2016 1:00 PM Performed by: Mechele Claude Pre-anesthesia Checklist: Patient identified, Emergency Drugs available, Suction available and Patient being monitored Patient Re-evaluated:Patient Re-evaluated prior to inductionOxygen Delivery Method: Circle system utilized Preoxygenation: Pre-oxygenation with 100% oxygen Intubation Type: IV induction Ventilation: Mask ventilation without difficulty Laryngoscope Size: Mac and 4 Grade View: Grade II Tube type: Oral Tube size: 8.0 mm Number of attempts: 1 Airway Equipment and Method: Stylet Placement Confirmation: ETT inserted through vocal cords under direct vision,  positive ETCO2 and breath sounds checked- equal and bilateral Secured at: 23 cm Tube secured with: Tape Dental Injury: Teeth and Oropharynx as per pre-operative assessment

## 2016-06-09 NOTE — Op Note (Signed)
.  Preoperative diagnosis: bladder tumor  Postoperative diagnosis: Same  Procedure: 1 cystoscopy 2.Transurethral resection of bladder tumor, medium Attending: Rosie Fate  Anesthesia: General  Estimated blood loss: Minimal  Drains: 22 French foley  Specimens: bladder tumor  Antibiotics: rocephin  Findings: 3cm residual bladder tumor on right lateral wall. Right ureteral stent left in situ  Indications: Patient is a 82 year old male/male with a history of high grade bladder cancer who is here for repeat resection.  After discussing treatment options, they decided proceed with transurethral resection of a bladder tumor.  Procedure her in detail: The patient was brought to the operating room and a brief timeout was done to ensure correct patient, correct procedure, correct site.  General anesthesia was administered patient was placed in dorsal lithotomy position.  Their genitalia was then prepped and draped in usual sterile fashion.  A rigid 74 French cystoscope was passed in the urethra and the bladder.  Bladder was inspected and we noted a 3cm residual bladder tumor at the resection site.  the ureteral orifices were in the normal orthotopic locations.  We then removed the cystoscope and placed a resectoscope into the bladder. We proceeded to remove the large clot burden from the bladder. Once this was complete we turned our attention to the bladder tumor. Using the bipolar resectoscope we removed the bladder tumor down to the base. A subsequent muscle deep biopsy was then taken. Hemostasis was then obtained with electrocautery. We then removed the bladder tumor chips and sent them for pathology. We then re-inspected the bladder and found no residula bleeding.  the bladder was then drained, a 22 French foley was placed and this concluded the procedure which was well tolerated by patient.  Complications: None  Condition: Stable, extubated, transferred to PACU  Plan: Patient is  discharged home. He will followup in 53 days for foley catheter removal and pathology discussion.

## 2016-06-09 NOTE — Anesthesia Preprocedure Evaluation (Signed)
Anesthesia Evaluation  Patient identified by MRN, date of birth, ID band Patient awake    Reviewed: Allergy & Precautions, NPO status , Patient's Chart, lab work & pertinent test results  Airway Mallampati: II  TM Distance: >3 FB Neck ROM: Full    Dental no notable dental hx.    Pulmonary sleep apnea , former smoker,    Pulmonary exam normal breath sounds clear to auscultation       Cardiovascular hypertension, Pt. on medications and Pt. on home beta blockers + CAD, + Cardiac Stents and + Peripheral Vascular Disease  Normal cardiovascular exam Rhythm:Regular Rate:Normal     Neuro/Psych negative neurological ROS  negative psych ROS   GI/Hepatic negative GI ROS, Neg liver ROS,   Endo/Other  negative endocrine ROS  Renal/GU negative Renal ROS  negative genitourinary   Musculoskeletal negative musculoskeletal ROS (+)   Abdominal   Peds negative pediatric ROS (+)  Hematology negative hematology ROS (+)   Anesthesia Other Findings   Reproductive/Obstetrics negative OB ROS                             Lab Results  Component Value Date   WBC 8.9 04/30/2016   HGB 15.7 04/30/2016   HCT 45.9 04/30/2016   MCV 96.4 04/30/2016   PLT 129* 04/30/2016   Lab Results  Component Value Date   CREATININE 0.90 04/30/2016   BUN 21* 04/30/2016   NA 141 04/30/2016   K 4.5 04/30/2016   CL 106 04/30/2016   CO2 29 04/30/2016    Anesthesia Physical  Anesthesia Plan  ASA: III  Anesthesia Plan: General   Post-op Pain Management:    Induction: Intravenous  Airway Management Planned: LMA  Additional Equipment:   Intra-op Plan:   Post-operative Plan: Extubation in OR  Informed Consent: I have reviewed the patients History and Physical, chart, labs and discussed the procedure including the risks, benefits and alternatives for the proposed anesthesia with the patient or authorized representative  who has indicated his/her understanding and acceptance.   Dental advisory given  Plan Discussed with: CRNA and Surgeon  Anesthesia Plan Comments:         Anesthesia Quick Evaluation

## 2016-06-10 NOTE — Anesthesia Postprocedure Evaluation (Signed)
Anesthesia Post Note  Patient: Derrick Rivera  Procedure(s) Performed: Procedure(s) (LRB): TRANSURETHRAL RESECTION OF BLADDER TUMOR WITH GYRUS (TURBT-GYRUS) (N/A)  Patient location during evaluation: PACU Anesthesia Type: General Level of consciousness: awake and alert Pain management: pain level controlled Vital Signs Assessment: post-procedure vital signs reviewed and stable Respiratory status: spontaneous breathing, nonlabored ventilation, respiratory function stable and patient connected to nasal cannula oxygen Cardiovascular status: blood pressure returned to baseline and stable Postop Assessment: no signs of nausea or vomiting Anesthetic complications: no    Last Vitals:  Filed Vitals:   06/09/16 1415 06/09/16 1509  BP: 112/89 114/67  Pulse: 72 76  Temp:  36.5 C  Resp: 12 14    Last Pain: There were no vitals filed for this visit.               Tiajuana Amass

## 2016-06-12 DIAGNOSIS — R310 Gross hematuria: Secondary | ICD-10-CM | POA: Diagnosis not present

## 2016-06-13 ENCOUNTER — Encounter (HOSPITAL_BASED_OUTPATIENT_CLINIC_OR_DEPARTMENT_OTHER): Payer: Self-pay | Admitting: Urology

## 2016-06-19 DIAGNOSIS — C672 Malignant neoplasm of lateral wall of bladder: Secondary | ICD-10-CM | POA: Diagnosis not present

## 2016-06-26 ENCOUNTER — Other Ambulatory Visit: Payer: Self-pay | Admitting: Cardiovascular Disease

## 2016-06-26 NOTE — Telephone Encounter (Signed)
REFILL 

## 2016-07-14 DIAGNOSIS — I10 Essential (primary) hypertension: Secondary | ICD-10-CM | POA: Diagnosis not present

## 2016-07-14 DIAGNOSIS — Z125 Encounter for screening for malignant neoplasm of prostate: Secondary | ICD-10-CM | POA: Diagnosis not present

## 2016-07-14 DIAGNOSIS — Z79899 Other long term (current) drug therapy: Secondary | ICD-10-CM | POA: Diagnosis not present

## 2016-07-14 DIAGNOSIS — I25810 Atherosclerosis of coronary artery bypass graft(s) without angina pectoris: Secondary | ICD-10-CM | POA: Diagnosis not present

## 2016-07-14 DIAGNOSIS — Z0000 Encounter for general adult medical examination without abnormal findings: Secondary | ICD-10-CM | POA: Diagnosis not present

## 2016-07-14 LAB — CBC WITH DIFFERENTIAL/PLATELET
Basophils Absolute: 0 cells/uL (ref 0–200)
Basophils Relative: 0 %
Eosinophils Absolute: 420 cells/uL (ref 15–500)
Eosinophils Relative: 5 %
HEMATOCRIT: 44.3 % (ref 38.5–50.0)
Hemoglobin: 15.4 g/dL (ref 13.2–17.1)
LYMPHS PCT: 29 %
Lymphs Abs: 2436 cells/uL (ref 850–3900)
MCH: 34 pg — ABNORMAL HIGH (ref 27.0–33.0)
MCHC: 34.8 g/dL (ref 32.0–36.0)
MCV: 97.8 fL (ref 80.0–100.0)
MONOS PCT: 11 %
MPV: 9.7 fL (ref 7.5–12.5)
Monocytes Absolute: 924 cells/uL (ref 200–950)
NEUTROS PCT: 55 %
Neutro Abs: 4620 cells/uL (ref 1500–7800)
Platelets: 133 10*3/uL — ABNORMAL LOW (ref 140–400)
RBC: 4.53 MIL/uL (ref 4.20–5.80)
RDW: 13.8 % (ref 11.0–15.0)
WBC: 8.4 10*3/uL (ref 3.8–10.8)

## 2016-07-14 LAB — LIPID PANEL
CHOL/HDL RATIO: 2.5 ratio (ref <=5.0)
Cholesterol: 156 mg/dL (ref 125–200)
HDL: 62 mg/dL (ref >=40)
LDL CALC: 69 mg/dL (ref <130)
Triglycerides: 123 mg/dL (ref <150)
VLDL: 25 mg/dL (ref <30)

## 2016-07-14 LAB — PSA: PSA: 0.8 ng/mL

## 2016-07-14 LAB — COMPREHENSIVE METABOLIC PANEL
ALT: 21 U/L (ref 9–46)
AST: 25 U/L (ref 10–35)
Albumin: 4 g/dL (ref 3.6–5.1)
Alkaline Phosphatase: 71 U/L (ref 40–115)
BILIRUBIN TOTAL: 0.8 mg/dL (ref 0.2–1.2)
BUN: 15 mg/dL (ref 7–25)
CALCIUM: 9.3 mg/dL (ref 8.6–10.3)
CO2: 29 mmol/L (ref 20–31)
Chloride: 108 mmol/L (ref 98–110)
Creat: 0.83 mg/dL (ref 0.70–1.11)
GLUCOSE: 86 mg/dL (ref 65–99)
POTASSIUM: 5.2 mmol/L (ref 3.5–5.3)
Sodium: 143 mmol/L (ref 135–146)
Total Protein: 6.1 g/dL (ref 6.1–8.1)

## 2016-07-14 LAB — TSH: TSH: 2.11 m[IU]/L (ref 0.40–4.50)

## 2016-07-15 DIAGNOSIS — C672 Malignant neoplasm of lateral wall of bladder: Secondary | ICD-10-CM | POA: Diagnosis not present

## 2016-07-15 DIAGNOSIS — Z5111 Encounter for antineoplastic chemotherapy: Secondary | ICD-10-CM | POA: Diagnosis not present

## 2016-07-16 ENCOUNTER — Encounter: Payer: Self-pay | Admitting: Cardiovascular Disease

## 2016-07-19 ENCOUNTER — Telehealth: Payer: Self-pay

## 2016-07-19 NOTE — Telephone Encounter (Signed)
Patient is on the list for [employer] 2017 and may be a good candidate for an AWV in 2017. Please let me know if/when appt is scheduled.   

## 2016-07-22 ENCOUNTER — Inpatient Hospital Stay (HOSPITAL_COMMUNITY): Admission: RE | Admit: 2016-07-22 | Payer: Medicare HMO | Source: Ambulatory Visit

## 2016-07-22 ENCOUNTER — Encounter (HOSPITAL_COMMUNITY): Payer: Medicare HMO

## 2016-07-22 DIAGNOSIS — C672 Malignant neoplasm of lateral wall of bladder: Secondary | ICD-10-CM | POA: Diagnosis not present

## 2016-07-22 DIAGNOSIS — Z5111 Encounter for antineoplastic chemotherapy: Secondary | ICD-10-CM | POA: Diagnosis not present

## 2016-07-24 ENCOUNTER — Telehealth (HOSPITAL_COMMUNITY): Payer: Self-pay | Admitting: *Deleted

## 2016-07-24 NOTE — Telephone Encounter (Signed)
Encounter closed

## 2016-07-25 ENCOUNTER — Ambulatory Visit (HOSPITAL_BASED_OUTPATIENT_CLINIC_OR_DEPARTMENT_OTHER)
Admission: RE | Admit: 2016-07-25 | Discharge: 2016-07-25 | Disposition: A | Discharge status: Home / Self Care | Payer: Medicare HMO | Source: Ambulatory Visit | Attending: Cardiology | Admitting: Cardiology

## 2016-07-25 ENCOUNTER — Encounter (HOSPITAL_COMMUNITY): Payer: Self-pay

## 2016-07-25 ENCOUNTER — Ambulatory Visit (HOSPITAL_COMMUNITY)
Admission: RE | Admit: 2016-07-25 | Discharge: 2016-07-25 | Disposition: A | Discharge status: Home / Self Care | Payer: Medicare HMO | Source: Ambulatory Visit | Attending: Cardiology | Admitting: Cardiology

## 2016-07-25 DIAGNOSIS — G4733 Obstructive sleep apnea (adult) (pediatric): Secondary | ICD-10-CM | POA: Diagnosis not present

## 2016-07-25 DIAGNOSIS — R5383 Other fatigue: Secondary | ICD-10-CM | POA: Diagnosis not present

## 2016-07-25 DIAGNOSIS — R9439 Abnormal result of other cardiovascular function study: Secondary | ICD-10-CM | POA: Insufficient documentation

## 2016-07-25 DIAGNOSIS — R0602 Shortness of breath: Secondary | ICD-10-CM | POA: Diagnosis not present

## 2016-07-25 DIAGNOSIS — I10 Essential (primary) hypertension: Secondary | ICD-10-CM

## 2016-07-25 DIAGNOSIS — Z87891 Personal history of nicotine dependence: Secondary | ICD-10-CM | POA: Diagnosis not present

## 2016-07-25 DIAGNOSIS — Z79899 Other long term (current) drug therapy: Secondary | ICD-10-CM

## 2016-07-25 DIAGNOSIS — R0609 Other forms of dyspnea: Secondary | ICD-10-CM | POA: Insufficient documentation

## 2016-07-25 DIAGNOSIS — I25810 Atherosclerosis of coronary artery bypass graft(s) without angina pectoris: Secondary | ICD-10-CM

## 2016-07-25 DIAGNOSIS — Z8249 Family history of ischemic heart disease and other diseases of the circulatory system: Secondary | ICD-10-CM | POA: Insufficient documentation

## 2016-07-25 DIAGNOSIS — Z0000 Encounter for general adult medical examination without abnormal findings: Secondary | ICD-10-CM

## 2016-07-25 MED ORDER — REGADENOSON 0.4 MG/5ML IV SOLN
0.4000 mg | Freq: Once | INTRAVENOUS | Status: AC
  Administered 2016-07-25: 0.4 mg via INTRAVENOUS

## 2016-07-25 MED ORDER — AMINOPHYLLINE 25 MG/ML IV SOLN
75.0000 mg | Freq: Once | INTRAVENOUS | Status: AC
  Administered 2016-07-25: 75 mg via INTRAVENOUS

## 2016-07-25 MED ORDER — TECHNETIUM TC 99M TETROFOSMIN IV KIT
30.4000 | PACK | Freq: Once | INTRAVENOUS | Status: AC | PRN
  Administered 2016-07-25: 30.4 via INTRAVENOUS
  Filled 2016-07-25: qty 30

## 2016-07-25 MED ORDER — TECHNETIUM TC 99M TETROFOSMIN IV KIT
10.6000 | PACK | Freq: Once | INTRAVENOUS | Status: AC | PRN
  Administered 2016-07-25: 10.6 via INTRAVENOUS
  Filled 2016-07-25: qty 11

## 2016-07-27 LAB — MYOCARDIAL PERFUSION IMAGING
CHL CUP STRESS STAGE 1 HR: 68 {beats}/min
CHL CUP STRESS STAGE 2 SPEED: 0 mph
CHL CUP STRESS STAGE 3 GRADE: 0 %
CHL CUP STRESS STAGE 3 HR: 66 {beats}/min
CHL CUP STRESS STAGE 3 SBP: 143 mmHg
CHL CUP STRESS STAGE 3 SPEED: 0 mph
CHL CUP STRESS STAGE 4 DBP: 91 mmHg
CHL CUP STRESS STAGE 4 GRADE: 0 %
CHL CUP STRESS STAGE 4 HR: 67 {beats}/min
CHL CUP STRESS STAGE 4 SBP: 153 mmHg
CHL CUP STRESS STAGE 4 SPEED: 0 mph
CSEPPBP: 143 mmHg
Estimated workload: 1 METS
LVDIAVOL: 68 mL (ref 62–150)
LVSYSVOL: 32 mL
NUC STRESS TID: 1.07
Peak HR: 66 {beats}/min
Percent of predicted max HR: 50 %
Rest HR: 61 {beats}/min
SDS: 4
SRS: 6
SSS: 10
Stage 1 DBP: 104 mmHg
Stage 1 Grade: 0 %
Stage 1 SBP: 141 mmHg
Stage 1 Speed: 0 mph
Stage 2 Grade: 0 %
Stage 2 HR: 68 {beats}/min
Stage 3 DBP: 100 mmHg

## 2016-07-28 ENCOUNTER — Other Ambulatory Visit: Payer: Self-pay | Admitting: Cardiovascular Disease

## 2016-07-28 NOTE — Telephone Encounter (Signed)
Rx(s) sent to pharmacy electronically.  

## 2016-07-29 ENCOUNTER — Ambulatory Visit (INDEPENDENT_AMBULATORY_CARE_PROVIDER_SITE_OTHER): Payer: Medicare HMO | Admitting: Cardiology

## 2016-07-29 ENCOUNTER — Encounter: Payer: Self-pay | Admitting: Cardiology

## 2016-07-29 VITALS — BP 144/90 | HR 70 | Ht 70.0 in | Wt 177.0 lb

## 2016-07-29 DIAGNOSIS — R0602 Shortness of breath: Secondary | ICD-10-CM | POA: Diagnosis not present

## 2016-07-29 DIAGNOSIS — R931 Abnormal findings on diagnostic imaging of heart and coronary circulation: Secondary | ICD-10-CM

## 2016-07-29 DIAGNOSIS — C672 Malignant neoplasm of lateral wall of bladder: Secondary | ICD-10-CM | POA: Diagnosis not present

## 2016-07-29 DIAGNOSIS — G4733 Obstructive sleep apnea (adult) (pediatric): Secondary | ICD-10-CM | POA: Diagnosis not present

## 2016-07-29 DIAGNOSIS — Z5111 Encounter for antineoplastic chemotherapy: Secondary | ICD-10-CM | POA: Diagnosis not present

## 2016-07-29 DIAGNOSIS — E7800 Pure hypercholesterolemia, unspecified: Secondary | ICD-10-CM | POA: Diagnosis not present

## 2016-07-29 DIAGNOSIS — I2510 Atherosclerotic heart disease of native coronary artery without angina pectoris: Secondary | ICD-10-CM

## 2016-07-29 MED ORDER — ISOSORBIDE MONONITRATE ER 60 MG PO TB24: ORAL_TABLET | ORAL | 2 refills | Status: DC

## 2016-07-29 NOTE — Progress Notes (Signed)
Cardiology Office Note   Date:  07/29/2016   ID:  Derrick Rivera, DOB 25-Jul-1924, MRN [id number]  PCP:  [name], MD  Cardiologist:  Dr. [name]    Chief Complaint  Patient presents with  . Shortness of Breath    abnormal nuc study      History of Present Illness: Derrick Rivera is a 82 y.o. male who presents for SOB and abnormal nuc study.   He has a hx. Of CAD and in 1995 he was found to have total occlusion of his RCA. In 2003 he underwent complex high-speed rotational atherectomy of his entire extremely calcified LAD system at multiple sites. At that time, I placed a 3.0x15 mm and 3.0x12 mm S7 stent in the proximal to mid region. A nuclear perfusion study in June 2012 showed an ejection fraction of 58% with moderate perfusion defect compatible with inferior scar with minimal peri-infarction ischemia concordant with his known mid RCA occlusion. He underwent a 2 year followup nuclear perfusion study on 07/06/2013 which was unchanged and showed an ejection fraction of 57% with mild inferolateral hypokinesis and a small inferolateral defect consistent with his prior abnormality. There was no significant reversible ischemia.  Also with sleep apnea.  In 09/2015 Dr. [name] scheduled him for a three-year follow-up Lexiscan myocardial perfusion study for further evaluation of his CAD.study was low risk with EF 45-54%.  But with ischmia and prior MI and peri-infarct ischemia.  More ischemia now. Possible restenosis in LAD with Lt to rt coll?    Pt has noticed in dyspnea with exertion since Jan.  Also with SOB at night -unsure if it wakes him or he is awake and is aware of the SOB.  No chest pain.    Recently pt has had bladder tumor removed + cancer this was in July and now having intra-bladder chemo from what the pt states.  He has 3 more treatments over 3 weeks and then a month afterward Dr. Alyson Ingles the urologist will decide if more chemo.  Pt does not believe anymore surgery is  required.    Pt and I discussed results and approach.  He is still active and would like cardiac cath if Dr. [name] agrees.  His renal function is stable.     Past Medical History:  Diagnosis Date  . Allergic rhinitis   . Bilateral carotid artery stenosis    per last duplex [id number]  RICA [id number],  LICA [id number]  . Bladder cancer [facility]) urologist-  dr Alyson Ingles   Non-invasise High Grade  . Coronary artery disease cardiologiat-  dr Shelva Majestic   hx PCI and stenting 2003/  per cardiac cath total occlusion RCA w/ collaterals 1995  . Diverticulosis of colon   . History of adenomatous polyp of colon    tubular adenoma's 1992; 1993; 1997; 2007  . History of colonic polyps   . History of nonmelanoma skin cancer    BCC and SCC right side nose  s/p moh's reseciton and reconstruction 2013/  left ear  01/ 2017/   06/ 2017 in office excision 2 areas on scalp  . History of TIAs takes ASA   pt states has a few what he thinks ia tia's, states last time with symptoms of veritgo and thought process was 04/ 2017  . HTN (hypertension)   . Hypercholesterolemia   . Non-Hodgkin lymphoma, unspecified, unspecified site [facility]) oncologist-  dr Benay Spice--  pt in clinical remission since 2000   dx 1996  no  specific therapy since 2000  . OA (osteoarthritis)   . OSA (obstructive sleep apnea)    moderate per study 08-10-2013--  intolerant cpap  . Prostate nodule   . Thrombocytopenia (HCC)    chronic mild  . Wears glasses     Past Surgical History:  Procedure Laterality Date  . CARDIOVASCULAR STRESS TEST  07-06-2013  dr Shelva Majestic   Low risk lexiscan nuclear study w/ a fixed inferolateral defect consistent with prior LCX territory scar,  no reversible ishemia/  mild inferolateral hypokinesis, ef 57% and normal LV wall motion   . CATARACT EXTRACTION W/ INTRAOCULAR LENS  IMPLANT, BILATERAL  2013  . CORONARY ANGIOPLASTY  1995   total occlusion RCA w/ collaterals;  PTCA 2nd marginal LCFX  . CORONARY ANGIOPLASTY  WITH STENT PLACEMENT  01-05-2002  dr Shelva Majestic   Complex high-speed rotational atherectomy of entire LAD multiple sites and stenting proximal and mid LAD/  no re-stenosis in prior CFX marginal dilatation site and RCA occluded w/ left to right collaterals,  ef 57%  . CYSTOSCOPY/RETROGRADE/URETEROSCOPY/STONE EXTRACTION WITH BASKET Bilateral 05/05/2016   Procedure: CYSTOSCOPY WITH RETROGRADE PYELOGRAM;  Surgeon: Cleon Gustin, MD;  Location: [facility];  Service: Urology;  Laterality: Bilateral;  . MOHS SURGERY  2013   at [facility]   nose -- w/ Reconstructive surgery same year  . MOHS SURGERY  01/ 2017   left ear  . REFRACTIVE SURGERY Left 05/ 2017  . TONSILLECTOMY  age 9  . TOTAL KNEE ARTHROPLASTY Left 03-07-2002  . TOTAL SHOULDER ARTHROPLASTY Left 04-30-2005  . TRANSTHORACIC ECHOCARDIOGRAM  04-29-2012   borderline inferior wall hypokinesis, ef 65%/  mild dilated RV/  mild to moderate AV calcification without stenosis/  mild AR, TR, and  MR/  moderate PR/  mild aortic root dilatation  . TRANSURETHRAL RESECTION OF BLADDER TUMOR WITH GYRUS (TURBT-GYRUS) N/A 05/05/2016   Procedure: TRANSURETHRAL RESECTION OF BLADDER TUMOR WITH GYRUS (TURBT-GYRUS);  Surgeon: Cleon Gustin, MD;  Location: [facility];  Service: Urology;  Laterality: N/A;  . TRANSURETHRAL RESECTION OF BLADDER TUMOR WITH GYRUS (TURBT-GYRUS) N/A 06/09/2016   Procedure: TRANSURETHRAL RESECTION OF BLADDER TUMOR WITH GYRUS (TURBT-GYRUS);  Surgeon: Cleon Gustin, MD;  Location: [facility];  Service: Urology;  Laterality: N/A;     Current Outpatient Prescriptions  Medication Sig Dispense Refill  . Ascorbic Acid (VITAMIN C) 500 MG tablet Take 500 mg by mouth every morning.     Marland Kitchen aspirin EC 81 MG tablet Take 81 mg by mouth every evening.    Marland Kitchen atenolol (TENORMIN) 50 MG tablet Take 0.5 tablets (25 mg total) by mouth 2 (two) times daily. 60 tablet 11  . atorvastatin (LIPITOR) 20 MG tablet Take 20 mg by mouth daily.    . B  Complex-C-Calcium (B-COMPLEX/VITAMIN C) TABS Take one tablet by mouth every other day  In AM    . fish oil-omega-3 fatty acids 1000 MG capsule Take one capsule by mouth daily    . isosorbide mononitrate (IMDUR) 60 MG 24 hr tablet Take 1 & 1/2 tablet daily around Noon. 90 tablet 2  . lisinopril (PRINIVIL,ZESTRIL) 20 MG tablet Take 20 mg by mouth daily.    . Multiple Vitamins-Minerals (MULTIVITAMIN WITH MINERALS) tablet Take one tablet by mouth every other day  In AM    . traMADol (ULTRAM) 50 MG tablet Take 1 tablet (50 mg total) by mouth every 6 (six) hours as needed. 30 tablet 0  . verapamil (CALAN) 120 MG tablet TAKE  ONE TABLET BY MOUTH DAILY. KEEP OFFICE VISIT 30 tablet 0   No current facility-administered medications for this visit.     Allergies:   Penicillins    Social History:  The patient  reports that he quit smoking about 49 years ago. His smoking use included Cigarettes. He has a 40.00 pack-year smoking history. He has never used smokeless tobacco. He reports that he does not drink alcohol or use drugs.   Family History:  The patient's family history includes Heart disease in his father; Pancreatic cancer in his mother.    ROS:  General:no colds or fevers, + weight increase milf Skin:no rashes or ulcers HEENT:no blurred vision, no congestion CV:see HPI PUL:see HPI GI:no diarrhea constipation or melena, no indigestion GU:no hematuria, no dysuria MS:no joint pain, no claudication Neuro:no syncope, no lightheadedness Endo:no diabetes, no thyroid disease  Wt Readings from Last 3 Encounters:  07/29/16 177 lb (80.3 kg)  07/25/16 173 lb (78.5 kg)  06/09/16 173 lb 8 oz (78.7 kg)     PHYSICAL EXAM: VS:  BP (!) 144/90   Pulse 70   Ht 5\' 10"  (1.778 m)   Wt 177 lb (80.3 kg)   BMI 25.40 kg/m  , BMI Body mass index is 25.4 kg/m. General:Pleasant affect, NAD Skin:Warm and dry, brisk capillary refill HEENT:normocephalic, sclera clear, mucus membranes moist Neck:supple, no  JVD, no bruits  Heart:S1S2 RRR without murmur, gallup, rub or click Lungs:clear without rales, rhonchi, or wheezes [id number], non tender, + BS, do not palpate liver spleen or masses Ext:no lower ext edema, 2+ pedal pulses, 2+ radial pulses Neuro:alert and oriented X 3, MAE, follows commands, + facial symmetry    EKG:  EKG is NOT ordered today.    Recent Labs: 07/14/2016: ALT 21; BUN 15; Creat 0.83; Hemoglobin 15.4; Platelets 133; Potassium 5.2; Sodium 143; TSH 2.11    Lipid Panel    Component Value Date/Time   CHOL 156 07/14/2016 0907   TRIG 123 07/14/2016 0907   HDL 62 07/14/2016 0907   CHOLHDL 2.5 07/14/2016 0907   VLDL 25 07/14/2016 0907   LDLCALC 69 07/14/2016 0907       Other studies Reviewed: Additional studies/ records that were reviewed today include:    myoview 07/25/16 . Study Highlights     The left ventricular ejection fraction is mildly decreased (45-54%).  Nuclear stress EF: 52%.  There was no ST segment deviation noted during stress.  No T wave inversion was noted during stress.  Defect 1: There is a medium defect of moderate severity present in the basal inferolateral, mid inferolateral and apical inferior location.  Findings consistent with ischemia and prior myocardial infarction with peri-infarct ischemia.  This is an intermediate risk study.   Low risk stress nuclear study. There is mixed scar and ischemia in the inferolateral wall.  The area involved is identical with the 2014 study, but there appears to be substantially more ischemic/viable myocardium on the current study, compared to an irreversible defect in 2014. Mildly decreased global systolic function.    CARDIAC CATH: IMPRESSION: Complex, but successful high-speed rotational atherectomy of the proximal mid and distal left anterior descending, percutaneous transluminal coronary angioplasty of the left anterior descending, and stenting of the proximal and mid left anterior  descending segments with the percent diameter stenoses being reduced to 0% at all sites, done with Integrilin and weight-adjusted heparinization. Dictated by:   Troy Sine, M.D. Attending Physician:  Thea Alken DD:  01/05/02 TD:  01/06/02  ASSESSMENT AND PLAN:  1.  SOB that began earlier this year and now with abnormal nuc study. Pt did not feel well with HCTZ that was added last year so has not been taking.  Will increase Imdur to 90 mg.  Have a call into Dr. [name] for his opinion to increase the meds or proceed with cath.    2. abnormal nuc study  3. CAD with hx of total RCA and stents to LAD. Hx of PTCA to LCX and 2nd OM.    4. Bladder cancer with intra bladder chemo and bladder tumor resection in 05/2016.  No hematuria.  5. HTN increased imdur  6. Hyperlipidemia continue statin   Current medicines are reviewed with the patient today.  The patient Has no concerns regarding medicines.  The following changes have been made:  See above Labs/ tests ordered today include:see above  Disposition:   FU:  see above  Signed, Cecilie Kicks, NP  07/29/2016 4:49 PM    [address] [place], [place], [place], [place] Phone: [phone]; Fax: [phone]

## 2016-07-29 NOTE — Patient Instructions (Signed)
Medications:  Your physician has recommended you make the following change in your medication:  Increase Isosorbide to 1 & 1/2 tablets (90 mg) daily around noon.   Follow-Up  Your physician recommends that you schedule a follow-up appointment in: 3 weeks with Dr. [name] (ONLY)

## 2016-08-04 ENCOUNTER — Other Ambulatory Visit: Payer: Self-pay | Admitting: Cardiovascular Disease

## 2016-08-05 ENCOUNTER — Telehealth: Payer: Self-pay | Admitting: *Deleted

## 2016-08-05 DIAGNOSIS — C672 Malignant neoplasm of lateral wall of bladder: Secondary | ICD-10-CM | POA: Diagnosis not present

## 2016-08-05 DIAGNOSIS — Z5111 Encounter for antineoplastic chemotherapy: Secondary | ICD-10-CM | POA: Diagnosis not present

## 2016-08-05 NOTE — Telephone Encounter (Signed)
Called pt per Cecilie Kicks, NP, to advise him that she spoke with Dr. [name] and he was ok with increasing Imdur and seeing him in the office.

## 2016-08-05 NOTE — Telephone Encounter (Signed)
Pt returned my call and he was advised that Dr. [name] was ok with increasing Imdur and to see him at his f/u appt.  Pt verbalized understanding

## 2016-08-06 DIAGNOSIS — C4442 Squamous cell carcinoma of skin of scalp and neck: Secondary | ICD-10-CM | POA: Diagnosis not present

## 2016-08-12 DIAGNOSIS — C672 Malignant neoplasm of lateral wall of bladder: Secondary | ICD-10-CM | POA: Diagnosis not present

## 2016-08-12 DIAGNOSIS — Z5111 Encounter for antineoplastic chemotherapy: Secondary | ICD-10-CM | POA: Diagnosis not present

## 2016-08-13 ENCOUNTER — Ambulatory Visit (INDEPENDENT_AMBULATORY_CARE_PROVIDER_SITE_OTHER): Payer: Medicare HMO | Admitting: Cardiovascular Disease

## 2016-08-13 ENCOUNTER — Encounter: Payer: Self-pay | Admitting: Cardiovascular Disease

## 2016-08-13 VITALS — BP 120/76 | HR 66 | Ht 70.0 in | Wt 179.0 lb

## 2016-08-13 DIAGNOSIS — I2510 Atherosclerotic heart disease of native coronary artery without angina pectoris: Secondary | ICD-10-CM | POA: Diagnosis not present

## 2016-08-13 DIAGNOSIS — E785 Hyperlipidemia, unspecified: Secondary | ICD-10-CM

## 2016-08-13 DIAGNOSIS — M25473 Effusion, unspecified ankle: Secondary | ICD-10-CM

## 2016-08-13 DIAGNOSIS — I10 Essential (primary) hypertension: Secondary | ICD-10-CM | POA: Diagnosis not present

## 2016-08-13 DIAGNOSIS — R0602 Shortness of breath: Secondary | ICD-10-CM | POA: Diagnosis not present

## 2016-08-13 DIAGNOSIS — R609 Edema, unspecified: Secondary | ICD-10-CM

## 2016-08-13 MED ORDER — HYDROCHLOROTHIAZIDE 12.5 MG PO CAPS: 12.5000 mg | ORAL_CAPSULE | Freq: Every day | ORAL | 6 refills | Status: DC

## 2016-08-13 NOTE — Patient Instructions (Signed)
Start HCTZ 12.5 mg daily    Lab work ( bmet ) in 2 weeks  08/27/16   Your physician wants you to follow-up in: 6 months. You will receive a reminder letter in the mail two months in advance. If you don't receive a letter, please call our office to schedule the follow-up appointment.

## 2016-08-15 ENCOUNTER — Ambulatory Visit: Payer: Medicare HMO | Admitting: Cardiovascular Disease

## 2016-08-15 NOTE — Progress Notes (Signed)
Patient ID: Derrick Rivera, male   DOB: 03-12-1924, 82 y.o.   MRN: [id number]     HPI: Derrick Rivera is a 82 y.o. male who presents for a  17 month cardiology followup evaluation.  [name] has established CAD and in 1995 he was found to have total occlusion of his RCA. In 2003 he underwent complex high-speed rotational atherectomy of his entire extremely calcified LAD system at multiple sites. At that time, I placed a 3.0x15 mm and 3.0x12 mm S7 stent in the proximal to mid region. A nuclear perfusion study in June 2012 showed an ejection fraction of 58% with moderate perfusion defect compatible with inferior scar with minimal peri-infarction ischemia concordant with his known mid RCA occlusion. He underwent a 2 year followup nuclear perfusion study on 07/06/2013 which was unchanged and showed an ejection fraction of 57% with mild inferolateral hypokinesis and a small inferolateral defect consistent with his prior abnormality. There was no significant reversible ischemia.  Additional problems include hypertension, hyperlipidemia, lymphoma followed by Dr. Ammie Dalton, and he is status post Mohs surgery for skin cancer with reconstructive plastic surgery at [facility].  He has been diagnosed on PSG to have moderate sleep apnea with an AHI of 27.  He never went for his CPAP titration and he does not use CPAP therapy.  He states he is sleeping well.  He wakes up, however, at least 2 times per night.  On a sleep study also was noted to have periodic leg movement disorder during sleep.  He denies painful restless legs.  Since I saw him one year ago, he denies any episodes of chest pain.  He denies significant shortness of breath, but with significant activity.  There is mild dyspnea.  He had undergone surgery on his scalp for squamous cell carcinoma and tolerated this well.  He also recently was diagnosed with bladder tumor and underwent bladder instillation of chemotherapy.  He is followed by Dr. Delight Hoh  [facility].  He admits to trivial ankle swelling right leg greater than left.  He is unaware of palpitations.  He presents for evaluation.  Past Medical History:  Diagnosis Date  . Allergic rhinitis   . Bilateral carotid artery stenosis    per last duplex [id number]  RICA 63-87%,  LICA 5-64%  . Bladder cancer [facility]) urologist-  dr Alyson Ingles   Non-invasise High Grade  . Coronary artery disease cardiologiat-  dr Shelva Majestic   hx PCI and stenting 2003/  per cardiac cath total occlusion RCA w/ collaterals 1995  . Diverticulosis of colon   . History of adenomatous polyp of colon    tubular adenoma's 1992; 1993; 1997; 2007  . History of colonic polyps   . History of nonmelanoma skin cancer    BCC and SCC right side nose  s/p moh's reseciton and reconstruction 2013/  left ear  01/ 2017/   06/ 2017 in office excision 2 areas on scalp  . History of TIAs takes ASA   pt states has a few what he thinks ia tia's, states last time with symptoms of veritgo and thought process was 04/ 2017  . HTN (hypertension)   . Hypercholesterolemia   . Non-Hodgkin lymphoma, unspecified, unspecified site [facility]) oncologist-  dr Benay Spice--  pt in clinical remission since 2000   dx 1996  no specific therapy since 2000  . OA (osteoarthritis)   . OSA (obstructive sleep apnea)    moderate per study 08-10-2013--  intolerant cpap  . Prostate nodule   .  Thrombocytopenia (HCC)    chronic mild  . Wears glasses     Past Surgical History:  Procedure Laterality Date  . CARDIOVASCULAR STRESS TEST  07-06-2013  dr Shelva Majestic   Low risk lexiscan nuclear study w/ a fixed inferolateral defect consistent with prior LCX territory scar,  no reversible ishemia/  mild inferolateral hypokinesis, ef 57% and normal LV wall motion   . CATARACT EXTRACTION W/ INTRAOCULAR LENS  IMPLANT, BILATERAL  2013  . CORONARY ANGIOPLASTY  1995   total occlusion RCA w/ collaterals;  PTCA 2nd marginal LCFX  . CORONARY ANGIOPLASTY WITH STENT  PLACEMENT  01-05-2002  dr Shelva Majestic   Complex high-speed rotational atherectomy of entire LAD multiple sites and stenting proximal and mid LAD/  no re-stenosis in prior CFX marginal dilatation site and RCA occluded w/ left to right collaterals,  ef 57%  . CYSTOSCOPY/RETROGRADE/URETEROSCOPY/STONE EXTRACTION WITH BASKET Bilateral 05/05/2016   Procedure: CYSTOSCOPY WITH RETROGRADE PYELOGRAM;  Surgeon: Cleon Gustin, MD;  Location: [facility];  Service: Urology;  Laterality: Bilateral;  . MOHS SURGERY  2013   at [facility]   nose -- w/ Reconstructive surgery same year  . MOHS SURGERY  01/ 2017   left ear  . REFRACTIVE SURGERY Left 05/ 2017  . TONSILLECTOMY  age 12  . TOTAL KNEE ARTHROPLASTY Left 03-07-2002  . TOTAL SHOULDER ARTHROPLASTY Left 04-30-2005  . TRANSTHORACIC ECHOCARDIOGRAM  04-29-2012   borderline inferior wall hypokinesis, ef 65%/  mild dilated RV/  mild to moderate AV calcification without stenosis/  mild AR, TR, and  MR/  moderate PR/  mild aortic root dilatation  . TRANSURETHRAL RESECTION OF BLADDER TUMOR WITH GYRUS (TURBT-GYRUS) N/A 05/05/2016   Procedure: TRANSURETHRAL RESECTION OF BLADDER TUMOR WITH GYRUS (TURBT-GYRUS);  Surgeon: Cleon Gustin, MD;  Location: [facility];  Service: Urology;  Laterality: N/A;  . TRANSURETHRAL RESECTION OF BLADDER TUMOR WITH GYRUS (TURBT-GYRUS) N/A 06/09/2016   Procedure: TRANSURETHRAL RESECTION OF BLADDER TUMOR WITH GYRUS (TURBT-GYRUS);  Surgeon: Cleon Gustin, MD;  Location: [facility];  Service: Urology;  Laterality: N/A;    Allergies  Allergen Reactions  . Penicillins Other (See Comments)    Did not help with strep throat--Unknown reaction  Has patient had a PCN reaction causing immediate rash, facial/tongue/throat swelling, SOB or lightheadedness with hypotension: no Has patient had a PCN reaction causing severe rash involving mucus membranes or skin necrosis: no Has patient had a PCN reaction that required hospitalization  yes - was in the army at the time Has patient had a PCN reaction occurring within the last 10 years:  no If all of the above answers are "NO", then may proceed with    Current Outpatient Prescriptions  Medication Sig Dispense Refill  . Ascorbic Acid (VITAMIN C) 500 MG tablet Take 500 mg by mouth every morning.     Marland Kitchen aspirin EC 81 MG tablet Take 81 mg by mouth every evening.    Marland Kitchen atenolol (TENORMIN) 50 MG tablet Take 0.5 tablets (25 mg total) by mouth 2 (two) times daily. 60 tablet 11  . atorvastatin (LIPITOR) 20 MG tablet Take 20 mg by mouth daily.    . B Complex-C-Calcium (B-COMPLEX/VITAMIN C) TABS Take one tablet by mouth every other day  In AM    . fish oil-omega-3 fatty acids 1000 MG capsule Take one capsule by mouth daily    . isosorbide mononitrate (IMDUR) 60 MG 24 hr tablet Take 1 & 1/2 tablet daily around Noon. 90 tablet 2  .  lisinopril (PRINIVIL,ZESTRIL) 20 MG tablet Take 20 mg by mouth daily.    . Multiple Vitamins-Minerals (MULTIVITAMIN WITH MINERALS) tablet Take one tablet by mouth every other day  In AM    . verapamil (CALAN) 120 MG tablet TAKE ONE TABLET BY MOUTH DAILY. KEEP OFFICE VISIT 30 tablet 0  . hydrochlorothiazide (MICROZIDE) 12.5 MG capsule Take 1 capsule (12.5 mg total) by mouth daily. 30 capsule 6   No current facility-administered medications for this visit.     Social History   Social History  . Marital status: Married    Spouse name: Statistician  . Number of children: 3  . Years of education: N/A   Occupational History  . retired-sales Chief Financial Officer    Social History Main Topics  . Smoking status: Former Smoker    Packs/day: 2.00    Years: 20.00    Types: Cigarettes    Quit date: 12/01/1966  . Smokeless tobacco: Never Used  . Alcohol use No     Comment: quit in 1954  . Drug use: No  . Sexual activity: Not on file   Other Topics Concern  . Not on file   Social History Narrative   Married 61 years in 2015. 3 kids. 5 grandkids.       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 Pancreatic cancer Mother   . Heart disease Father    Social history is notable that he is married has 3 children 5 grandchildren. There is a remote tobacco history but he quit over 40 years ago.   ROS General: Negative; No fevers, chills, or night sweats;  HEENT: Negative; No changes in vision or hearing, sinus congestion, difficulty swallowing Pulmonary: Negative; No cough, wheezing, shortness of breath, hemoptysis Cardiovascular: Negative; No chest pain, presyncope, syncope, palpitations GI: Negative; No nausea, vomiting, diarrhea, or abdominal pain GU: Negative; No dysuria, hematuria, or difficulty voiding Musculoskeletal: Negative; no myalgias, joint pain, or weakness Hematologic/Oncology: Status post plastic surgery for skin was cancer with scarring of his nose.  History of lymphoma; no easy bruising, bleeding Endocrine: Negative; no heat/cold intolerance; no diabetes Neuro: Occasional vertigo Skin: Negative; No rashes or skin lesions Psychiatric: Negative; No behavioral problems, depression Sleep: Positive for obstructive sleep apnea and periodic limb movement disorder.  No hypnogognic hallucinations, no cataplexy Other comprehensive 14 point system review is negative.   PE BP 120/76 (BP Location: Left Arm, Patient Position: Sitting, Cuff Size: Normal)   Pulse 66   Ht 5' 10"  (1.778 m)   Wt 179 lb (81.2 kg)   BMI 25.68 kg/m   Repeat blood pressure 130/70.  Wt Readings from Last 3 Encounters:  08/13/16 179 lb (81.2 kg)  07/29/16 177 lb (80.3 kg)  07/25/16 173 lb (78.5 kg)   General: Alert, oriented, no distress.  Skin: normal turgor, no rashes HEENT: Normocephalic,  atraumatic. Pupils round and reactive; sclera anicteric;no lid lag.  Nose without nasal septal hypertrophy, scarring secondary to prior nasal surgery. Mouth/Parynx benign; Mallinpatti scale 3 Neck: No JVD, no carotid bruits  Lungs: clear to ausculatation and percussion; no wheezing or rales Chest wall: Nontender to palpation Heart: RRR, s1 s2 normal 1/6 systolic murmur; no diastolic murmur.  No S3 or S4 gallop.  No rubs, thrills, or cc Abdomen: Mild diastases recti. No renal artery bruits. soft, nontender; no hepatosplenomehaly, BS+; abdominal aorta nontender and not dilated by palpation. Back: No CVA tenderness Pulses 2+ Extremities: Mild right ankle swelling: no clubbing cyanosis, Homan's sign negative  Neurologic: grossly nonfocal Psychological: Normal affect and mood  ECG (independently read by me): Sinus rhythm with an isolated PVC.  QTC interval 431 ms.  Ventricular rate 67.  October 2015 ECG (independently read by me): Sinus bradycardia 55 beats per minute.  Borderline first degree AV block with a PR interval of 202 ms.  No significant ST-T changes.  Prior August 2014 ECG: Sinus rhythm at 68 beats per minute. Intervals normal.  LABS: BMP Latest Ref Rng & Units 07/14/2016 06/09/2016 04/30/2016  Glucose 65 - 99 mg/dL 86 95 96  BUN 7 - 25 mg/dL 15 20 21(H)  Creatinine 0.70 - 1.11 mg/dL 0.83 0.90 0.90  Sodium 135 - 146 mmol/L 143 141 141  Potassium 3.5 - 5.3 mmol/L 5.2 4.5 4.5  Chloride 98 - 110 mmol/L 108 104 106  CO2 20 - 31 mmol/L 29 - 29  Calcium 8.6 - 10.3 mg/dL 9.3 - 9.6   Hepatic Function Latest Ref Rng & Units 07/14/2016 09/17/2015 09/04/2014  Total Protein 6.1 - 8.1 g/dL 6.1 6.4 6.5  Albumin 3.6 - 5.1 g/dL 4.0 4.1 4.4  AST 10 - 35 U/L 25 23 24   ALT 9 - 46 U/L 21 18 20   Alk Phosphatase 40 - 115 U/L 71 67 61  Total Bilirubin 0.2 - 1.2 mg/dL 0.8 1.1 0.7  Bilirubin, Direct 0.0 - 0.3 mg/dL - - -   CBC Latest Ref Rng & Units 07/14/2016 06/09/2016 04/30/2016  WBC 3.8 - 10.8 K/uL  8.4 - 8.9  Hemoglobin 13.2 - 17.1 g/dL 15.4 14.3 15.7  Hematocrit 38.5 - 50.0 % 44.3 42.0 45.9  Platelets 140 - 400 K/uL 133(L) - 129(L)   Lab Results  Component Value Date   MCV 97.8 07/14/2016   MCV 96.4 04/30/2016   MCV 98.1 (H) 11/01/2015   Lab Results  Component Value Date   TSH 2.11 07/14/2016   Lipid Panel     Component Value Date/Time   CHOL 156 07/14/2016 0907   TRIG 123 07/14/2016 0907   HDL 62 07/14/2016 0907   CHOLHDL 2.5 07/14/2016 0907   VLDL 25 07/14/2016 0907   LDLCALC 69 07/14/2016 0907   RADIOLOGY: No results found.    ASSESSMENT AND PLAN: Mr. Paradiso  is an 82 year old white male who has known RCA occlusion by initial catheterization in 1995. He is 13 years status post complex high-speed rotational appendectomy and stenting of his proximal and mid LAD. His last nuclear study in 2014 was unchanged from 2 years previously and showed an area of small inferolateral scar. No significant ischemia was demonstrated.  . Remotely he had experienced some mild vertigo-like symptoms.    I reviewed his recent carotid studies which were done on 09/18/2015.  This revealed stable plaque being 40-59% in the right internal carotid and less than 39% in the left internal carotid.   Vertebral arteries were patent with antegrade flow and normal subclavian arteries bilaterally.  On 07/25/2016 he underwent a follow-up nuclear perfusion study.  This was unchanged from the 2014 study and showed a defect in the inferolateral and apical inferior wall consistent with scar/ischemia, felt to be intermediate risk.  Ejection fraction was 52%.  When compared to the 2014 study,  there appeared to be less scar on the present study.  He continues to be without anginal symptomatology on his medical regimen consisting of isosorbide 90 mg, verapamil 120 mg, atenolol 25 mrem twice a day, in addition to lisinopril 20 mg daily.  His blood pressure today is stable and on repeat by me was 120/76.  His ECG is  unchanged.  There is residual ankle edema, right greater than left today.  I am adding HCTZ 12.5 mg to his medical regimen with plans for follow-up bmet several weeks.  He is tolerating atorvastatin 20 mg for his hyperlipidemia with target LDL less than 70.  Most recent blood work revealed his LDL at 81.  I will see him in 6 months for follow-up evaluation.   Troy Sine, MD, [facility]  08/15/2016 2:03 PM

## 2016-08-19 DIAGNOSIS — Z5111 Encounter for antineoplastic chemotherapy: Secondary | ICD-10-CM | POA: Diagnosis not present

## 2016-08-19 DIAGNOSIS — C672 Malignant neoplasm of lateral wall of bladder: Secondary | ICD-10-CM | POA: Diagnosis not present

## 2016-08-22 ENCOUNTER — Ambulatory Visit (INDEPENDENT_AMBULATORY_CARE_PROVIDER_SITE_OTHER): Payer: Medicare HMO

## 2016-08-22 DIAGNOSIS — L820 Inflamed seborrheic keratosis: Secondary | ICD-10-CM | POA: Diagnosis not present

## 2016-08-22 DIAGNOSIS — C44212 Basal cell carcinoma of skin of right ear and external auricular canal: Secondary | ICD-10-CM | POA: Diagnosis not present

## 2016-08-22 DIAGNOSIS — L570 Actinic keratosis: Secondary | ICD-10-CM | POA: Diagnosis not present

## 2016-08-22 DIAGNOSIS — Z23 Encounter for immunization: Secondary | ICD-10-CM | POA: Diagnosis not present

## 2016-08-22 DIAGNOSIS — X32XXXD Exposure to sunlight, subsequent encounter: Secondary | ICD-10-CM | POA: Diagnosis not present

## 2016-08-22 DIAGNOSIS — D0439 Carcinoma in situ of skin of other parts of face: Secondary | ICD-10-CM | POA: Diagnosis not present

## 2016-08-22 DIAGNOSIS — D225 Melanocytic nevi of trunk: Secondary | ICD-10-CM | POA: Diagnosis not present

## 2016-08-22 DIAGNOSIS — Z08 Encounter for follow-up examination after completed treatment for malignant neoplasm: Secondary | ICD-10-CM | POA: Diagnosis not present

## 2016-08-22 DIAGNOSIS — C4441 Basal cell carcinoma of skin of scalp and neck: Secondary | ICD-10-CM | POA: Diagnosis not present

## 2016-08-22 DIAGNOSIS — Z85828 Personal history of other malignant neoplasm of skin: Secondary | ICD-10-CM | POA: Diagnosis not present

## 2016-08-25 ENCOUNTER — Other Ambulatory Visit: Payer: Self-pay | Admitting: Cardiology

## 2016-08-25 NOTE — Telephone Encounter (Signed)
Rx request sent to pharmacy.  

## 2016-08-27 DIAGNOSIS — I10 Essential (primary) hypertension: Secondary | ICD-10-CM | POA: Diagnosis not present

## 2016-08-27 DIAGNOSIS — I2510 Atherosclerotic heart disease of native coronary artery without angina pectoris: Secondary | ICD-10-CM | POA: Diagnosis not present

## 2016-08-28 LAB — BASIC METABOLIC PANEL
BUN: 23 mg/dL (ref 7–25)
CALCIUM: 10 mg/dL (ref 8.6–10.3)
CHLORIDE: 102 mmol/L (ref 98–110)
CO2: 27 mmol/L (ref 20–31)
CREATININE: 1.03 mg/dL (ref 0.70–1.11)
Glucose, Bld: 89 mg/dL (ref 65–99)
Potassium: 4.5 mmol/L (ref 3.5–5.3)
Sodium: 140 mmol/L (ref 135–146)

## 2016-09-19 ENCOUNTER — Telehealth: Payer: Self-pay | Admitting: *Deleted

## 2016-09-19 NOTE — Telephone Encounter (Signed)
Called to notify the patient that his handicap application has been completed by Dr [name] and ready for pick up. His wife informs me that they have already gotten the form done through another doctor. We can "just throw away" the form that was left here at our office.

## 2016-09-25 ENCOUNTER — Other Ambulatory Visit: Payer: Self-pay | Admitting: Cardiovascular Disease

## 2016-10-06 NOTE — Progress Notes (Signed)
Subjective:   Derrick Rivera is a 82 y.o. male who presents for Medicare Annual/Subsequent preventive examination.  The Patient was informed that the wellness visit is to identify future health risk and educate and initiate measures that can reduce risk for increased disease through the lifespan.    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   Describes health as good, fair or great? Good; tries to take care of hisself  Preventive Screening -Counseling & Management   Current smoking/ tobacco status/ Former smoker Quit 30; did have 40 pack year hx   Second Hand Smoke status; No Smokers in the home ETOH; NO  RISK FACTORS Regular exercise/ 15 minutes he rides a stationary bike 5 to 10 minutes to do hand weights;   Diet Weight has been stable most of his lfe No beef, chicken and fish Few vegetables;  Few starches; doe snot have to have bread Eats bran cereal Likes fruits   Fall risk; no ; several in the home (3)  Due to tripping,  but has not been hurt;  Has learned to bend over very slowly and keep head up Stoops as opposed to bending ; Does climb attic stairs; still does 3 times a month Advised of risk to do so; goes upstairs to check vents, roof etc.  Both use walk in shower   Mobility of Functional changes this year?  Does not climb steps as well; still does all the shopping for the family  Takes them very slowly/does not cut grass anymore this year ;  Long term plan is to stay in home; does not need  assistance at this time Does wear sunscreen in the summer/ had cancer taken from nose  Wears a hot; sees a dermatologist x 2 per year;  MVA; none (use to drive 40 to [id number] miles a year) no issues with driving   Cardiac Risk  Factors:  Advanced aged > 73 in men;  RCA 2003; stent RCA Hyperlipidemia (chol 149; Trig 120; HDL 59; LDL 66) HTN medically managed;  Family History (pancreatic cancer; HD)   Depression Screen PhQ 2: negative No issues Has goal every day to accomplish something  Activities of Daily Living - See functional screen   Hearing Difficulty: good; lost of high pitched sounds only   Ophthalmology Exam:  This year in May  20/20 in one eye Blurry in the other No diseases etc   Cognitive testing; Failing to remember names;  Ad8 score reviewed for issues:  Issues making decisions:  Less interest in hobbies / activities:  Repeats questions, stories (family complaining):  Trouble using ordinary gadgets (microwave, computer, phone):  Has a Patent examiner the month or year:   Mismanaging finances: does all the finances   Remembering appts:  Daily problems with thinking and/or memory: Ad8 score is= 0 ; states he makes notes now  Engineering school taught him to make notes    Advanced Directives  Has old one but is updating his calendar .  List the name of Physicians or other Practitioners you currently use:  [facility]; fup tumor in bladder; caused some bleeding Now doing bladder wash x 6;  Now waiting to see the result; if cancer is still there;  Immunization History  Administered Date(s) Administered  . Influenza Split 08/27/2012  . Influenza Whole 09/06/2008, 08/02/2011  . Influenza, High Dose Seasonal PF 09/12/2013, 08/22/2016  . Influenza,inj,Quad PF,36+ Mos 07/31/2014, 08/10/2015  . Pneumococcal Conjugate-13 12/20/2013  . Pneumococcal Polysaccharide-23 07/31/2014  . Td 06/27/2008   Required Immunizations needed today  Screening test up to date or reviewed for plan of completion There are no preventive care reminders to display for this patient.   The following information was reviewed  Allergies; Medications; Past Medical Hx; Problem list;  Surgical hx; Family hx; Social Hx  Cardiac Risk Factors include: advanced age (>49men, >58 women);dyslipidemia;family history of premature cardiovascular disease;hypertension;male gender     Objective:    Vitals: BP 128/78   Pulse 67   Ht 5\' 10"  (1.778 m)   Wt 180 lb (81.6 kg)   SpO2 97%   BMI 25.83 kg/m   Body mass index is 25.83 kg/m.  Tobacco History  Smoking Status  . Former Smoker  . Packs/day: 2.00  . Years: 20.00  . Types: Cigarettes  . Quit date: 12/01/1966  Smokeless Tobacco  . Never Used    Comment: quit when he was 54      Counseling given: Yes   Past Medical History:  Diagnosis Date  . Allergic rhinitis   . Bilateral carotid artery stenosis    per last duplex [id number]  RICA [id number],  LICA [id number]  . Bladder cancer [facility]) urologist-  dr Alyson Ingles   Non-invasise High Grade  . Coronary artery disease cardiologiat-  dr Shelva Majestic   hx PCI and stenting 2003/  per cardiac cath total occlusion RCA w/ collaterals 1995  . Diverticulosis of colon   . History of adenomatous polyp of colon    tubular adenoma's 1992; 1993; 1997; 2007  . History of colonic polyps   . History of nonmelanoma skin cancer    BCC and SCC right side nose  s/p moh's reseciton and reconstruction 2013/  left ear  01/ 2017/   06/ 2017 in office excision 2 areas on scalp  . History of TIAs takes ASA   pt states has a few what he thinks ia tia's, states last time with symptoms of veritgo and thought process was 04/ 2017  . HTN (hypertension)   . Hypercholesterolemia   . Non-Hodgkin lymphoma, unspecified, unspecified site [facility]) oncologist-  dr Benay Spice--  pt in clinical remission since 2000   dx 1996  no specific therapy since 2000  . OA (osteoarthritis)   . OSA (obstructive sleep apnea)    moderate per study 08-10-2013--  intolerant cpap  . Prostate nodule   . Thrombocytopenia (HCC)    chronic mild  . Wears glasses    Past Surgical History:  Procedure Laterality Date  . CARDIOVASCULAR  STRESS TEST  07-06-2013  dr Shelva Majestic   Low risk lexiscan nuclear study w/ a fixed inferolateral defect consistent with prior LCX territory scar,  no reversible ishemia/  mild inferolateral hypokinesis, ef 57% and normal LV wall motion   . CATARACT EXTRACTION W/ INTRAOCULAR LENS  IMPLANT, BILATERAL  2013  . CORONARY ANGIOPLASTY  1995   total occlusion RCA w/ collaterals;  PTCA 2nd marginal LCFX  . CORONARY ANGIOPLASTY WITH STENT PLACEMENT  01-05-2002  dr Shelva Majestic   Complex high-speed rotational atherectomy of entire LAD multiple sites and stenting proximal and mid LAD/  no re-stenosis in prior CFX marginal dilatation site and RCA occluded w/ left to right collaterals,  ef 57%  .  CYSTOSCOPY/RETROGRADE/URETEROSCOPY/STONE EXTRACTION WITH BASKET Bilateral 05/05/2016   Procedure: CYSTOSCOPY WITH RETROGRADE PYELOGRAM;  Surgeon: Cleon Gustin, MD;  Location: [facility];  Service: Urology;  Laterality: Bilateral;  . MOHS SURGERY  2013   at [facility]   nose -- w/ Reconstructive surgery same year  . MOHS SURGERY  01/ 2017   left ear  . REFRACTIVE SURGERY Left 05/ 2017  . TONSILLECTOMY  age 48  . TOTAL KNEE ARTHROPLASTY Left 03-07-2002  . TOTAL SHOULDER ARTHROPLASTY Left 04-30-2005  . TRANSTHORACIC ECHOCARDIOGRAM  04-29-2012   borderline inferior wall hypokinesis, ef 65%/  mild dilated RV/  mild to moderate AV calcification without stenosis/  mild AR, TR, and  MR/  moderate PR/  mild aortic root dilatation  . TRANSURETHRAL RESECTION OF BLADDER TUMOR WITH GYRUS (TURBT-GYRUS) N/A 05/05/2016   Procedure: TRANSURETHRAL RESECTION OF BLADDER TUMOR WITH GYRUS (TURBT-GYRUS);  Surgeon: Cleon Gustin, MD;  Location: [facility];  Service: Urology;  Laterality: N/A;  . TRANSURETHRAL RESECTION OF BLADDER TUMOR WITH GYRUS (TURBT-GYRUS) N/A 06/09/2016   Procedure: TRANSURETHRAL RESECTION OF BLADDER TUMOR WITH GYRUS (TURBT-GYRUS);  Surgeon: Cleon Gustin, MD;  Location: [facility];  Service:  Urology;  Laterality: N/A;   Family History  Problem Relation Age of Onset  . Pancreatic cancer Mother   . Heart disease Father    History  Sexual Activity  . Sexual activity: Not on file    Outpatient Encounter Prescriptions as of 10/07/2016  Medication Sig  . Ascorbic Acid (VITAMIN C) 500 MG tablet Take 500 mg by mouth every morning.   Marland Kitchen aspirin EC 81 MG tablet Take 81 mg by mouth every evening.  Marland Kitchen atenolol (TENORMIN) 50 MG tablet Take 0.5 tablets (25 mg total) by mouth 2 (two) times daily.  Marland Kitchen atorvastatin (LIPITOR) 20 MG tablet TAKE 1 TABLET (20 MG TOTAL) BY MOUTH DAILY.  . B Complex-C-Calcium (B-COMPLEX/VITAMIN C) TABS Take one tablet by mouth every other day  In AM  . fish oil-omega-3 fatty acids 1000 MG capsule Take one capsule by mouth daily  . hydrochlorothiazide (MICROZIDE) 12.5 MG capsule Take 1 capsule (12.5 mg total) by mouth daily.  . isosorbide mononitrate (IMDUR) 60 MG 24 hr tablet Take 1 & 1/2 tablet daily around Noon.  Marland Kitchen lisinopril (PRINIVIL,ZESTRIL) 20 MG tablet Take 20 mg by mouth daily.  . Multiple Vitamins-Minerals (MULTIVITAMIN WITH MINERALS) tablet Take one tablet by mouth every other day  In AM  . verapamil (CALAN) 120 MG tablet TAKE ONE TABLET BY MOUTH ONCE DAILY   No facility-administered encounter medications on file as of 10/07/2016.     Activities of Daily Living In your present state of health, do you have any difficulty performing the following activities: 10/07/2016 06/09/2016  Hearing? N N  Vision? N N  Difficulty concentrating or making decisions? N N  Walking or climbing stairs? N N  Dressing or bathing? N N  Doing errands, shopping? N -  Preparing Food and eating ? N -  Using the Toilet? N -  In the past six months, have you accidently leaked urine? N -  Do you have problems with loss of bowel control? N -  Managing your Medications? N -  Managing your Finances? N -  Housekeeping or managing your Housekeeping? N -  Some recent data might be  hidden    Patient Care Team: [name], MD as PCP - General (Family Medicine)   Assessment:    ASSESSMENT INCLUDED:  Review for health  history including a functional assessment, fall risk, depression screen, memory loss, vision and hearing screens; Was educated regarding falls   Psychosocial risk reviewed as stress; unresolved grief; pain; lack of support; lack of income to buy groceries, meds etc.  Behavioral risk addressed such as tobacco, ETOH; diet (metabolic syndrome) and exercise  Risk for independent living or long term plan   Risk for safety; Bathroom; community; firearms, sun protection; auto accidents   All immunizations and overdue screens were reviewed for a plan or follow-up.   Labs were reviewed in regard to Lipids and A1c if appropriate.   Discussed Recommended screenings and documented any personalized health advice and referrals for preventive counseling.  See AVS for patient instructions;     Exercise Activities and Dietary recommendations Current Exercise Habits: Home exercise routine, Type of exercise: walking, Time (Minutes): 60, Frequency (Times/Week): 4, Weekly Exercise (Minutes/Week): 240, Intensity: Mild  Goals    . patient          Continue to plan a goal every day!! Likes to stay busy       Fall Risk Fall Risk  10/07/2016 08/10/2015 10/31/2014 12/20/2013  Falls in the past year? Yes No Yes No  Number falls in past yr: 2 or more - 1 -  Injury with Fall? Yes - Yes -  Risk Factor Category  - - High Fall Risk -  Risk for fall due to : - - History of fall(s) -  Follow up Education provided - - -   Depression Screen PHQ 2/9 Scores 10/07/2016 08/10/2015 12/20/2013  PHQ - 2 Score 0 0 0    Cognitive Function MMSE - Mini Mental State Exam 10/07/2016  Not completed: (No Data)      Mental functioning is very good; mood stable;  No issues at the present time.   Immunization History  Administered Date(s) Administered  . Influenza Split 08/27/2012   . Influenza Whole 09/06/2008, 08/02/2011  . Influenza, High Dose Seasonal PF 09/12/2013, 08/22/2016  . Influenza,inj,Quad PF,36+ Mos 07/31/2014, 08/10/2015  . Pneumococcal Conjugate-13 12/20/2013  . Pneumococcal Polysaccharide-23 07/31/2014  . Td 06/27/2008   Screening Tests Health Maintenance  Topic Date Due  . ZOSTAVAX  09/01/2039 (Originally 11/22/1986)  . TETANUS/TDAP  06/27/2018  . INFLUENZA VACCINE  Completed  . PNA vac Low Risk Adult  Completed      Plan:     Keep walking!!  Has developed a cough; been x 1 year; doesn't seem to be getting better. Thinks it is allergies; at night; sometimes coughs up flim Does not take anything OTC; He is getting concerned and some wheezing;  Recommend he make an apt with Dr. Yong Channel and he will schedule   During the course of the visit the patient was educated and counseled about the following appropriate screening and preventive services:   Vaccines to include Pneumoccal, Influenza, Hepatitis B, Td, Zostavax, HCV  Electrocardiogram  Cardiovascular Disease; no symptoms as this time   Colorectal cancer screening aged out  Diabetes screening   Prostate Cancer Screening neg  Glaucoma screening neg  Nutrition counseling   Smoking cessation counseling   Patient Instructions (the written plan) was given to the patient.    Wynetta Fines, RN  10/07/2016

## 2016-10-07 ENCOUNTER — Ambulatory Visit (INDEPENDENT_AMBULATORY_CARE_PROVIDER_SITE_OTHER): Payer: Medicare HMO

## 2016-10-07 VITALS — BP 128/78 | HR 67 | Ht 70.0 in | Wt 180.0 lb

## 2016-10-07 DIAGNOSIS — Z0000 Encounter for general adult medical examination without abnormal findings: Secondary | ICD-10-CM

## 2016-10-07 NOTE — Progress Notes (Signed)
I have reviewed and agree with note, evaluation, plan. Agree with follow up for chronic cough  [name], MD

## 2016-10-07 NOTE — Patient Instructions (Addendum)
Derrick Rivera , Thank you for taking time to come for your Medicare Wellness Visit. I appreciate your ongoing commitment to your health goals. Please review the following plan we discussed and let me know if I can assist you in the future.   Will make an apt with Dr. Yong Channel to fup on cough  Will continue to exercise and continue to stay engaged   These are the goals we discussed: Goals    . patient          Continue to plan a goal every day!! Likes to stay busy        This is a list of the screening recommended for you and due dates:  Health Maintenance  Topic Date Due  . Shingles Vaccine  09/01/2039*  . Tetanus Vaccine  06/27/2018  . Flu Shot  Completed  . Pneumonia vaccines  Completed  *Topic was postponed. The date shown is not the original due date.      Fall Prevention in the Home  Falls can cause injuries. They can happen to people of all ages. There are many things you can do to make your home safe and to help prevent falls.  WHAT CAN I DO ON THE OUTSIDE OF MY HOME?  Regularly fix the edges of walkways and driveways and fix any cracks.  Remove anything that might make you trip as you walk through a door, such as a raised step or threshold.  Trim any bushes or trees on the path to your home.  Use bright outdoor lighting.  Clear any walking paths of anything that might make someone trip, such as rocks or tools.  Regularly check to see if handrails are loose or broken. Make sure that both sides of any steps have handrails.  Any raised decks and porches should have guardrails on the edges.  Have any leaves, snow, or ice cleared regularly.  Use sand or salt on walking paths during winter.  Clean up any spills in your garage right away. This includes oil or grease spills. WHAT CAN I DO IN THE BATHROOM?   Use night lights.  Install grab bars by the toilet and in the tub and shower. Do not use towel bars as grab bars.  Use non-skid mats or decals in the tub or  shower.  If you need to sit down in the shower, use a plastic, non-slip stool.  Keep the floor dry. Clean up any water that spills on the floor as soon as it happens.  Remove soap buildup in the tub or shower regularly.  Attach bath mats securely with double-sided non-slip rug tape.  Do not have throw rugs and other things on the floor that can make you trip. WHAT CAN I DO IN THE BEDROOM?  Use night lights.  Make sure that you have a light by your bed that is easy to reach.  Do not use any sheets or blankets that are too big for your bed. They should not hang down onto the floor.  Have a firm chair that has side arms. You can use this for support while you get dressed.  Do not have throw rugs and other things on the floor that can make you trip. WHAT CAN I DO IN THE KITCHEN?  Clean up any spills right away.  Avoid walking on wet floors.  Keep items that you use a lot in easy-to-reach places.  If you need to reach something above you, use a strong step stool that has  a grab bar.  Keep electrical cords out of the way.  Do not use floor polish or wax that makes floors slippery. If you must use wax, use non-skid floor wax.  Do not have throw rugs and other things on the floor that can make you trip. WHAT CAN I DO WITH MY STAIRS?  Do not leave any items on the stairs.  Make sure that there are handrails on both sides of the stairs and use them. Fix handrails that are broken or loose. Make sure that handrails are as long as the stairways.  Check any carpeting to make sure that it is firmly attached to the stairs. Fix any carpet that is loose or worn.  Avoid having throw rugs at the top or bottom of the stairs. If you do have throw rugs, attach them to the floor with carpet tape.  Make sure that you have a light switch at the top of the stairs and the bottom of the stairs. If you do not have them, ask someone to add them for you. WHAT ELSE CAN I DO TO HELP PREVENT  FALLS?  Wear shoes that:  Do not have high heels.  Have rubber bottoms.  Are comfortable and fit you well.  Are closed at the toe. Do not wear sandals.  If you use a stepladder:  Make sure that it is fully opened. Do not climb a closed stepladder.  Make sure that both sides of the stepladder are locked into place.  Ask someone to hold it for you, if possible.  Clearly mark and make sure that you can see:  Any grab bars or handrails.  First and last steps.  Where the edge of each step is.  Use tools that help you move around (mobility aids) if they are needed. These include:  Canes.  Walkers.  Scooters.  Crutches.  Turn on the lights when you go into a dark area. Replace any light bulbs as soon as they burn out.  Set up your furniture so you have a clear path. Avoid moving your furniture around.  If any of your floors are uneven, fix them.  If there are any pets around you, be aware of where they are.  Review your medicines with your doctor. Some medicines can make you feel dizzy. This can increase your chance of falling. Ask your doctor what other things that you can do to help prevent falls.   This information is not intended to replace advice given to you by your health care provider. Make sure you discuss any questions you have with your health care provider.   Document Released: 09/13/2009 Document Revised: 04/03/2015 Document Reviewed: 12/22/2014 Elsevier Interactive Patient Education 2016 [place] Maintenance, Male A healthy lifestyle and preventative care can promote health and wellness.  Maintain regular health, dental, and eye exams.  Eat a healthy diet. Foods like vegetables, fruits, whole grains, low-fat dairy products, and lean protein foods contain the nutrients you need and are low in calories. Decrease your intake of foods high in solid fats, added sugars, and salt. Get information about a proper diet from your health care  provider, if necessary.  Regular physical exercise is one of the most important things you can do for your health. Most adults should get at least 150 minutes of moderate-intensity exercise (any activity that increases your heart rate and causes you to sweat) each week. In addition, most adults need muscle-strengthening exercises on 2 or more days a week.  Maintain a healthy weight. The body mass index (BMI) is a screening tool to identify possible weight problems. It provides an estimate of body fat based on height and weight. Your health care provider can find your BMI and can help you achieve or maintain a healthy weight. For males 20 years and older:  A BMI below 18.5 is considered underweight.  A BMI of 18.5 to 24.9 is normal.  A BMI of 25 to 29.9 is considered overweight.  A BMI of 30 and above is considered obese.  Maintain normal blood lipids and cholesterol by exercising and minimizing your intake of saturated fat. Eat a balanced diet with plenty of fruits and vegetables. Blood tests for lipids and cholesterol should begin at age 54 and be repeated every 5 years. If your lipid or cholesterol levels are high, you are over age 89, or you are at high risk for heart disease, you may need your cholesterol levels checked more frequently.Ongoing high lipid and cholesterol levels should be treated with medicines if diet and exercise are not working.  If you smoke, find out from your health care provider how to quit. If you do not use tobacco, do not start.  Lung cancer screening is recommended for adults aged 62-80 years who are at high risk for developing lung cancer because of a history of smoking. A yearly low-dose CT scan of the lungs is recommended for people who have at least a 30-pack-year history of smoking and are current smokers or have quit within the past 15 years. A pack year of smoking is smoking an average of 1 pack of cigarettes a day for 1 year (for example, a 30-pack-year  history of smoking could mean smoking 1 pack a day for 30 years or 2 packs a day for 15 years). Yearly screening should continue until the smoker has stopped smoking for at least 15 years. Yearly screening should be stopped for people who develop a health problem that would prevent them from having lung cancer treatment.  If you choose to drink alcohol, do not have more than 2 drinks per day. One drink is considered to be 12 oz (360 mL) of beer, 5 oz (150 mL) of wine, or 1.5 oz (45 mL) of liquor.  Avoid the use of street drugs. Do not share needles with anyone. Ask for help if you need support or instructions about stopping the use of drugs.  High blood pressure causes heart disease and increases the risk of stroke. High blood pressure is more likely to develop in:  People who have blood pressure in the end of the normal range (100-139/85-89 mm Hg).  People who are overweight or obese.  People who are African American.  If you are 37-25 years of age, have your blood pressure checked every 3-5 years. If you are 63 years of age or older, have your blood pressure checked every year. You should have your blood pressure measured twice--once when you are at a hospital or clinic, and once when you are not at a hospital or clinic. Record the average of the two measurements. To check your blood pressure when you are not at a hospital or clinic, you can use:  An automated blood pressure machine at a pharmacy.  A home blood pressure monitor.  If you are 3-41 years old, ask your health care provider if you should take aspirin to prevent heart disease.  Diabetes screening involves taking a blood sample to check your fasting blood sugar level. This  should be done once every 3 years after age 55 if you are at a normal weight and without risk factors for diabetes. Testing should be considered at a younger age or be carried out more frequently if you are overweight and have at least 1 risk factor for  diabetes.  Colorectal cancer can be detected and often prevented. Most routine colorectal cancer screening begins at the age of 64 and continues through age 67. However, your health care provider may recommend screening at an earlier age if you have risk factors for colon cancer. On a yearly basis, your health care provider may provide home test kits to check for hidden blood in the stool. A small camera at the end of a tube may be used to directly examine the colon (sigmoidoscopy or colonoscopy) to detect the earliest forms of colorectal cancer. Talk to your health care provider about this at age 13 when routine screening begins. A direct exam of the colon should be repeated every 5-10 years through age 72, unless early forms of precancerous polyps or small growths are found.  People who are at an increased risk for hepatitis B should be screened for this virus. You are considered at high risk for hepatitis B if:  You were born in a country where hepatitis B occurs often. Talk with your health care provider about which countries are considered high risk.  Your parents were born in a high-risk country and you have not received a shot to protect against hepatitis B (hepatitis B vaccine).  You have HIV or AIDS.  You use needles to inject street drugs.  You live with, or have sex with, someone who has hepatitis B.  You are a man who has sex with other men (MSM).  You get hemodialysis treatment.  You take certain medicines for conditions like cancer, organ transplantation, and autoimmune conditions.  Hepatitis C blood testing is recommended for all people born from [birth year] through 1963 and any individual with known risk factors for hepatitis C.  Healthy men should no longer receive prostate-specific antigen (PSA) blood tests as part of routine cancer screening. Talk to your health care provider about prostate cancer screening.  Testicular cancer screening is not recommended for adolescents or  adult males who have no symptoms. Screening includes self-exam, a health care provider exam, and other screening tests. Consult with your health care provider about any symptoms you have or any concerns you have about testicular cancer.  Practice safe sex. Use condoms and avoid high-risk sexual practices to reduce the spread of sexually transmitted infections (STIs).  You should be screened for STIs, including gonorrhea and chlamydia if:  You are sexually active and are younger than 24 years.  You are older than 24 years, and your health care provider tells you that you are at risk for this type of infection.  Your sexual activity has changed since you were last screened, and you are at an increased risk for chlamydia or gonorrhea. Ask your health care provider if you are at risk.  If you are at risk of being infected with HIV, it is recommended that you take a prescription medicine daily to prevent HIV infection. This is called pre-exposure prophylaxis (PrEP). You are considered at risk if:  You are a man who has sex with other men (MSM).  You are a heterosexual man who is sexually active with multiple partners.  You take drugs by injection.  You are sexually active with a partner who has HIV.  Talk with your health care provider about whether you are at high risk of being infected with HIV. If you choose to begin PrEP, you should first be tested for HIV. You should then be tested every 3 months for as long as you are taking PrEP.  Use sunscreen. Apply sunscreen liberally and repeatedly throughout the day. You should seek shade when your shadow is shorter than you. Protect yourself by wearing long sleeves, pants, a wide-brimmed hat, and sunglasses year round whenever you are outdoors.  Tell your health care provider of new moles or changes in moles, especially if there is a change in shape or color. Also, tell your health care provider if a mole is larger than the size of a pencil  eraser.  A one-time screening for abdominal aortic aneurysm (AAA) and surgical repair of large AAAs by ultrasound is recommended for men aged 12-75 years who are current or former smokers.  Stay current with your vaccines (immunizations).   This information is not intended to replace advice given to you by your health care provider. Make sure you discuss any questions you have with your health care provider.   Document Released: 05/15/2008 Document Revised: 12/08/2014 Document Reviewed: 04/14/2011 Elsevier Interactive Patient Education [employer].

## 2016-11-20 DIAGNOSIS — C672 Malignant neoplasm of lateral wall of bladder: Secondary | ICD-10-CM | POA: Diagnosis not present

## 2016-11-21 ENCOUNTER — Other Ambulatory Visit: Payer: Self-pay | Admitting: Urology

## 2016-12-03 ENCOUNTER — Ambulatory Visit (INDEPENDENT_AMBULATORY_CARE_PROVIDER_SITE_OTHER)
Admission: RE | Admit: 2016-12-03 | Discharge: 2016-12-03 | Disposition: A | Discharge status: Home / Self Care | Payer: Medicare HMO | Source: Ambulatory Visit | Attending: Family Medicine | Admitting: Family Medicine

## 2016-12-03 ENCOUNTER — Ambulatory Visit (INDEPENDENT_AMBULATORY_CARE_PROVIDER_SITE_OTHER): Payer: Medicare HMO | Admitting: Family Medicine

## 2016-12-03 ENCOUNTER — Encounter: Payer: Self-pay | Admitting: Family Medicine

## 2016-12-03 VITALS — BP 102/68 | HR 69 | Temp 97.4°F | Ht 70.0 in | Wt 181.6 lb

## 2016-12-03 DIAGNOSIS — R05 Cough: Secondary | ICD-10-CM | POA: Diagnosis not present

## 2016-12-03 DIAGNOSIS — R053 Chronic cough: Secondary | ICD-10-CM

## 2016-12-03 DIAGNOSIS — R0602 Shortness of breath: Secondary | ICD-10-CM | POA: Diagnosis not present

## 2016-12-03 MED ORDER — FLUTICASONE PROPIONATE 50 MCG/ACT NA SUSP: 2.0000 | Freq: Every day | NASAL | 6 refills | Status: DC

## 2016-12-03 NOTE — Progress Notes (Signed)
Subjective:  Derrick Rivera is a 83 y.o. year old very pleasant male patient who presents for/with See problem oriented charting ROS- no fever, chills, unintentional weight loss. Baseline SOB follows with cardiology   Past Medical History-  Patient Active Problem List   Diagnosis Date Noted  . Prostate nodule 08/10/2015    Priority: High  . NON-HODGKIN'S LYMPHOMA 07/09/2008    Priority: High  . Coronary artery disease 06/26/2008    Priority: High  . Obstructive sleep apnea hypopnea, moderate 09/16/2014    Priority: Medium  . Hyperlipidemia LDL goal <70 09/16/2014    Priority: Medium  . THROMBOCYTOPENIA 07/09/2008    Priority: Medium  . Essential hypertension 06/26/2008    Priority: Medium  . Basal cell carcinoma 07/31/2014    Priority: Low  . Laceration 06/13/2014    Priority: Low  . Herpes zoster 03/30/2012    Priority: Low  . Allergic rhinitis 06/27/2008    Priority: Low  . COLONIC POLYPS 06/26/2008    Priority: Low  . Chronic venous insufficiency 06/26/2008    Priority: Low  . GERD 06/26/2008    Priority: Low  . DEGENERATIVE JOINT DISEASE 06/26/2008    Priority: Low    Medications- reviewed and updated Current Outpatient Prescriptions  Medication Sig Dispense Refill  . Ascorbic Acid (VITAMIN C) 500 MG tablet Take 500 mg by mouth every morning.     Marland Kitchen aspirin EC 81 MG tablet Take 81 mg by mouth every evening.    Marland Kitchen atenolol (TENORMIN) 50 MG tablet Take 0.5 tablets (25 mg total) by mouth 2 (two) times daily. 60 tablet 11  . atorvastatin (LIPITOR) 20 MG tablet TAKE 1 TABLET (20 MG TOTAL) BY MOUTH DAILY. 90 tablet 1  . B Complex-C-Calcium (B-COMPLEX/VITAMIN C) TABS Take one tablet by mouth every other day  In AM    . fish oil-omega-3 fatty acids 1000 MG capsule Take one capsule by mouth daily    . isosorbide mononitrate (IMDUR) 60 MG 24 hr tablet Take 1 & 1/2 tablet daily around Noon. 90 tablet 2  . lisinopril (PRINIVIL,ZESTRIL) 20 MG tablet Take 20 mg by mouth daily.     . Multiple Vitamins-Minerals (MULTIVITAMIN WITH MINERALS) tablet Take one tablet by mouth every other day  In AM    . verapamil (CALAN) 120 MG tablet TAKE ONE TABLET BY MOUTH ONCE DAILY 30 tablet 6  . fluticasone (FLONASE) 50 MCG/ACT nasal spray Place 2 sprays into both nostrils daily. 16 g 6  . hydrochlorothiazide (MICROZIDE) 12.5 MG capsule Take 1 capsule (12.5 mg total) by mouth daily. 30 capsule 6   No current facility-administered medications for this visit.     Objective: BP 102/68 (BP Location: Left Arm, Patient Position: Sitting, Cuff Size: Large)   Pulse 69   Temp 97.4 F (36.3 C) (Oral)   Ht 5\' 10"  (1.778 m)   Wt 181 lb 9.6 oz (82.4 kg)   SpO2 97%   BMI 26.06 kg/m  Gen: NAD, resting comfortably Clear rhinorrhea noted, orpharynx largely normal CV: RRR no murmurs rubs or gallops Lungs: CTAB no crackles, wheeze, rhonchi Ext: no edema Skin: warm, dry  Assessment/Plan:  Chronic cough S: nasal drainage and now tends to run almost all the time- worse with cold weather. Then seemed to get down into his chest then he was able to cough it up. And seemed to clear it but now it feels stuck down in his chest (with some sparing phlegm. Laying on his side- feels like pressure makes  him want to cough. Occasional wheeze. Has been going on for at least a year but now seems more consistent. Does have some SOb following cardiology for. Quit smoking age 49.  A/P: Chronic cough- allergic rhinitis and ACe-I near top of differnetial - CXR today independently reviewed without clear cause for chronic cough. Doubt copd/chronic bronchitis. No pneumonia or mass.  -given rhinorrhea- trial flonase for potential allergic rhinitis. Vs. Upper airway cough syndrome -given on chronic ace- inhibitor and BP on low side- trial off and update me within 2 weeks for home BPS - then see me back in about 4 months  - left lisinopril on list for now, but would consider losartan as alternative if BP better but cough  improves  Orders Placed This Encounter  Procedures  . DG Chest 2 View    Standing Status:   Future    Number of Occurrences:   1    Standing Expiration Date:   01/31/2018    Order Specific Question:   Reason for Exam (SYMPTOM  OR DIAGNOSIS REQUIRED)    Answer:   chronic cough 1 year- suspect allergies but also on ace. Former smoker over 40 years ago    Order Specific Question:   Preferred imaging location?    Answer:   [facility]    Meds ordered this encounter  Medications  . fluticasone (FLONASE) 50 MCG/ACT nasal spray    Sig: Place 2 sprays into both nostrils daily.    Dispense:  16 g    Refill:  6  new acute condition not previously reported with medication management  Return precautions advised.  [name], MD

## 2016-12-03 NOTE — Progress Notes (Signed)
Pre visit review using our clinic review tool, if applicable. No additional management support is needed unless otherwise documented below in the visit note. 

## 2016-12-03 NOTE — Patient Instructions (Signed)
Start flonase- upper airway cough syndrome with an allergic element may be at play here.   Hold lisinopril for 2 month trial. Update me in 2 weeks- can call us or do through mychart. If blood pressure running high may start alternate. Goal is at least <140/90  Please go to [facility] - located [address] across [address] from [place] - in the basement - Hours: 8:30-5:30 PM M-F. Do not need appointment.

## 2016-12-09 ENCOUNTER — Telehealth: Payer: Self-pay | Admitting: Oncology

## 2016-12-09 NOTE — Telephone Encounter (Signed)
Faxed records to [facility] risk adjustment °

## 2016-12-18 ENCOUNTER — Encounter (HOSPITAL_BASED_OUTPATIENT_CLINIC_OR_DEPARTMENT_OTHER): Payer: Self-pay | Admitting: *Deleted

## 2016-12-18 NOTE — Progress Notes (Signed)
NPO AFTER MN.  ARRIVE AT 1000.  NEEDS ISTAT 8.  CURRENT EKG AND CXR IN CHART AND EPIC.  WILL TAKE IMDUR AND ATENOLOL AM DOS W/ SIPS OF WATER.

## 2016-12-22 ENCOUNTER — Encounter (HOSPITAL_BASED_OUTPATIENT_CLINIC_OR_DEPARTMENT_OTHER)
Admission: RE | Disposition: A | Discharge status: Home / Self Care | Payer: Self-pay | Source: Ambulatory Visit | Attending: Urology

## 2016-12-22 ENCOUNTER — Ambulatory Visit (HOSPITAL_BASED_OUTPATIENT_CLINIC_OR_DEPARTMENT_OTHER): Payer: Medicare HMO | Admitting: Anesthesiology

## 2016-12-22 ENCOUNTER — Encounter (HOSPITAL_BASED_OUTPATIENT_CLINIC_OR_DEPARTMENT_OTHER): Payer: Self-pay | Admitting: *Deleted

## 2016-12-22 ENCOUNTER — Ambulatory Visit (HOSPITAL_BASED_OUTPATIENT_CLINIC_OR_DEPARTMENT_OTHER)
Admission: RE | Admit: 2016-12-22 | Discharge: 2016-12-22 | Disposition: A | Discharge status: Home / Self Care | Payer: Medicare HMO | Source: Ambulatory Visit | Attending: Urology | Admitting: Urology

## 2016-12-22 DIAGNOSIS — G4730 Sleep apnea, unspecified: Secondary | ICD-10-CM | POA: Diagnosis not present

## 2016-12-22 DIAGNOSIS — C672 Malignant neoplasm of lateral wall of bladder: Secondary | ICD-10-CM | POA: Insufficient documentation

## 2016-12-22 DIAGNOSIS — C8590 Non-Hodgkin lymphoma, unspecified, unspecified site: Secondary | ICD-10-CM | POA: Diagnosis not present

## 2016-12-22 DIAGNOSIS — Z96612 Presence of left artificial shoulder joint: Secondary | ICD-10-CM | POA: Diagnosis not present

## 2016-12-22 DIAGNOSIS — Z7951 Long term (current) use of inhaled steroids: Secondary | ICD-10-CM | POA: Diagnosis not present

## 2016-12-22 DIAGNOSIS — I739 Peripheral vascular disease, unspecified: Secondary | ICD-10-CM | POA: Insufficient documentation

## 2016-12-22 DIAGNOSIS — Z8673 Personal history of transient ischemic attack (TIA), and cerebral infarction without residual deficits: Secondary | ICD-10-CM | POA: Diagnosis not present

## 2016-12-22 DIAGNOSIS — Z955 Presence of coronary angioplasty implant and graft: Secondary | ICD-10-CM | POA: Diagnosis not present

## 2016-12-22 DIAGNOSIS — I6523 Occlusion and stenosis of bilateral carotid arteries: Secondary | ICD-10-CM | POA: Diagnosis not present

## 2016-12-22 DIAGNOSIS — I10 Essential (primary) hypertension: Secondary | ICD-10-CM | POA: Insufficient documentation

## 2016-12-22 DIAGNOSIS — Z7982 Long term (current) use of aspirin: Secondary | ICD-10-CM | POA: Insufficient documentation

## 2016-12-22 DIAGNOSIS — Z87891 Personal history of nicotine dependence: Secondary | ICD-10-CM | POA: Insufficient documentation

## 2016-12-22 DIAGNOSIS — Z96652 Presence of left artificial knee joint: Secondary | ICD-10-CM | POA: Insufficient documentation

## 2016-12-22 DIAGNOSIS — E7800 Pure hypercholesterolemia, unspecified: Secondary | ICD-10-CM | POA: Insufficient documentation

## 2016-12-22 DIAGNOSIS — Z85828 Personal history of other malignant neoplasm of skin: Secondary | ICD-10-CM | POA: Diagnosis not present

## 2016-12-22 DIAGNOSIS — G4733 Obstructive sleep apnea (adult) (pediatric): Secondary | ICD-10-CM | POA: Diagnosis not present

## 2016-12-22 DIAGNOSIS — I2510 Atherosclerotic heart disease of native coronary artery without angina pectoris: Secondary | ICD-10-CM | POA: Diagnosis not present

## 2016-12-22 DIAGNOSIS — D494 Neoplasm of unspecified behavior of bladder: Secondary | ICD-10-CM | POA: Diagnosis not present

## 2016-12-22 DIAGNOSIS — Z8572 Personal history of non-Hodgkin lymphomas: Secondary | ICD-10-CM | POA: Insufficient documentation

## 2016-12-22 DIAGNOSIS — K635 Polyp of colon: Secondary | ICD-10-CM | POA: Diagnosis not present

## 2016-12-22 DIAGNOSIS — Z79899 Other long term (current) drug therapy: Secondary | ICD-10-CM | POA: Insufficient documentation

## 2016-12-22 DIAGNOSIS — C679 Malignant neoplasm of bladder, unspecified: Secondary | ICD-10-CM | POA: Diagnosis not present

## 2016-12-22 HISTORY — PX: CYSTOSCOPY W/ RETROGRADES: SHX1426

## 2016-12-22 HISTORY — DX: Chronic cough: R05.3

## 2016-12-22 HISTORY — DX: Cough: R05

## 2016-12-22 HISTORY — PX: TRANSURETHRAL RESECTION OF BLADDER TUMOR: SHX2575

## 2016-12-22 LAB — POCT I-STAT, CHEM 8
BUN: 20 mg/dL (ref 6–20)
CHLORIDE: 104 mmol/L (ref 101–111)
Calcium, Ion: 1.26 mmol/L (ref 1.15–1.40)
Creatinine, Ser: 1 mg/dL (ref 0.61–1.24)
GLUCOSE: 94 mg/dL (ref 65–99)
HEMATOCRIT: 47 % (ref 39.0–52.0)
HEMOGLOBIN: 16 g/dL (ref 13.0–17.0)
POTASSIUM: 4 mmol/L (ref 3.5–5.1)
Sodium: 141 mmol/L (ref 135–145)
TCO2: 25 mmol/L (ref 0–100)

## 2016-12-22 SURGERY — TURBT (TRANSURETHRAL RESECTION OF BLADDER TUMOR)
Anesthesia: General | Site: Renal

## 2016-12-22 MED ORDER — OXYCODONE HCL 5 MG PO TABS
5.0000 mg | ORAL_TABLET | Freq: Once | ORAL | Status: DC | PRN
  Filled 2016-12-22: qty 1

## 2016-12-22 MED ORDER — DEXAMETHASONE SODIUM PHOSPHATE 4 MG/ML IJ SOLN
INTRAMUSCULAR | Status: DC | PRN
  Administered 2016-12-22: 10 mg via INTRAVENOUS

## 2016-12-22 MED ORDER — TRAMADOL HCL 50 MG PO TABS: 50.0000 mg | ORAL_TABLET | Freq: Four times a day (QID) | ORAL | 0 refills | Status: DC | PRN

## 2016-12-22 MED ORDER — ONDANSETRON HCL 4 MG/2ML IJ SOLN
4.0000 mg | Freq: Four times a day (QID) | INTRAMUSCULAR | Status: DC | PRN
  Filled 2016-12-22: qty 2

## 2016-12-22 MED ORDER — FENTANYL CITRATE (PF) 100 MCG/2ML IJ SOLN
INTRAMUSCULAR | Status: DC | PRN
  Administered 2016-12-22: 50 ug via INTRAVENOUS

## 2016-12-22 MED ORDER — ONDANSETRON HCL 4 MG/2ML IJ SOLN
INTRAMUSCULAR | Status: AC
Start: 2016-12-22 — End: 2016-12-22
  Filled 2016-12-22: qty 2

## 2016-12-22 MED ORDER — PHENYLEPHRINE HCL 10 MG/ML IJ SOLN
INTRAMUSCULAR | Status: DC | PRN
  Administered 2016-12-22: 800 ug via INTRAVENOUS
  Administered 2016-12-22: 120 ug via INTRAVENOUS

## 2016-12-22 MED ORDER — SODIUM CHLORIDE 0.9 % IR SOLN
Status: DC | PRN
  Administered 2016-12-22: 3000 mL

## 2016-12-22 MED ORDER — STERILE WATER FOR IRRIGATION IR SOLN
Status: DC | PRN
Start: 2016-12-22 — End: 2016-12-22
  Administered 2016-12-22: 3000 mL

## 2016-12-22 MED ORDER — ACETAMINOPHEN 10 MG/ML IV SOLN
INTRAVENOUS | Status: AC
  Filled 2016-12-22: qty 100

## 2016-12-22 MED ORDER — LACTATED RINGERS IV SOLN
INTRAVENOUS | Status: DC
  Administered 2016-12-22: 10:00:00 via INTRAVENOUS
  Filled 2016-12-22: qty 1000

## 2016-12-22 MED ORDER — DEXAMETHASONE SODIUM PHOSPHATE 10 MG/ML IJ SOLN
INTRAMUSCULAR | Status: AC
Start: 2016-12-22 — End: 2016-12-22
  Filled 2016-12-22: qty 1

## 2016-12-22 MED ORDER — FENTANYL CITRATE (PF) 100 MCG/2ML IJ SOLN
INTRAMUSCULAR | Status: AC
  Filled 2016-12-22: qty 2

## 2016-12-22 MED ORDER — CEFAZOLIN SODIUM-DEXTROSE 2-4 GM/100ML-% IV SOLN
INTRAVENOUS | Status: AC
  Filled 2016-12-22: qty 100

## 2016-12-22 MED ORDER — FLUORESCEIN SODIUM 10 % IV SOLN
INTRAVENOUS | Status: AC
  Filled 2016-12-22: qty 5

## 2016-12-22 MED ORDER — OXYCODONE HCL 5 MG/5ML PO SOLN
5.0000 mg | Freq: Once | ORAL | Status: DC | PRN
  Filled 2016-12-22: qty 5

## 2016-12-22 MED ORDER — PROPOFOL 10 MG/ML IV BOLUS
INTRAVENOUS | Status: AC
  Filled 2016-12-22: qty 20

## 2016-12-22 MED ORDER — LIDOCAINE HCL (CARDIAC) 20 MG/ML IV SOLN
INTRAVENOUS | Status: DC | PRN
  Administered 2016-12-22: 80 mg via INTRAVENOUS

## 2016-12-22 MED ORDER — CEFAZOLIN SODIUM-DEXTROSE 2-4 GM/100ML-% IV SOLN
2.0000 g | Freq: Once | INTRAVENOUS | Status: AC
  Administered 2016-12-22: 2 g via INTRAVENOUS
  Filled 2016-12-22: qty 100

## 2016-12-22 MED ORDER — IOHEXOL 300 MG/ML  SOLN
INTRAMUSCULAR | Status: DC | PRN
  Administered 2016-12-22: 25 mL via URETHRAL

## 2016-12-22 MED ORDER — ACETAMINOPHEN 10 MG/ML IV SOLN
INTRAVENOUS | Status: DC | PRN
  Administered 2016-12-22: 1000 mg via INTRAVENOUS

## 2016-12-22 MED ORDER — FENTANYL CITRATE (PF) 100 MCG/2ML IJ SOLN
25.0000 ug | INTRAMUSCULAR | Status: DC | PRN
  Filled 2016-12-22: qty 1

## 2016-12-22 MED ORDER — PHENYLEPHRINE 40 MCG/ML (10ML) SYRINGE FOR IV PUSH (FOR BLOOD PRESSURE SUPPORT)
PREFILLED_SYRINGE | INTRAVENOUS | Status: AC
Start: 2016-12-22 — End: 2016-12-22
  Filled 2016-12-22: qty 10

## 2016-12-22 MED ORDER — ONDANSETRON HCL 4 MG/2ML IJ SOLN
INTRAMUSCULAR | Status: DC | PRN
  Administered 2016-12-22: 4 mg via INTRAVENOUS

## 2016-12-22 MED ORDER — PROPOFOL 10 MG/ML IV BOLUS
INTRAVENOUS | Status: DC | PRN
  Administered 2016-12-22: 110 mg via INTRAVENOUS

## 2016-12-22 SURGICAL SUPPLY — 22 items
BAG DRAIN URO-CYSTO SKYTR STRL (DRAIN) ×3 IMPLANT
BAG URINE DRAINAGE (UROLOGICAL SUPPLIES) ×3 IMPLANT
CATH FOLEY 3WAY 30CC 22F (CATHETERS) ×3 IMPLANT
CATH INTERMIT  6FR 70CM (CATHETERS) ×3 IMPLANT
CLOTH BEACON ORANGE TIMEOUT ST (SAFETY) ×3 IMPLANT
ELECT LOOP 22F BIPOLAR SML (ELECTROSURGICAL) ×3
ELECT REM PT RETURN 9FT ADLT (ELECTROSURGICAL) ×3
ELECTRODE LOOP 22F BIPOLAR SML (ELECTROSURGICAL) ×2 IMPLANT
ELECTRODE REM PT RTRN 9FT ADLT (ELECTROSURGICAL) ×2 IMPLANT
EVACUATOR MICROVAS BLADDER (UROLOGICAL SUPPLIES) IMPLANT
GLOVE BIO SURGEON STRL SZ8 (GLOVE) ×3 IMPLANT
GOWN STRL REUS W/TWL LRG LVL3 (GOWN DISPOSABLE) ×3 IMPLANT
GOWN STRL REUS W/TWL XL LVL3 (GOWN DISPOSABLE) ×3 IMPLANT
HOLDER FOLEY CATH W/STRAP (MISCELLANEOUS) ×3 IMPLANT
IV NS IRRIG 3000ML ARTHROMATIC (IV SOLUTION) ×3 IMPLANT
KIT ROOM TURNOVER WOR (KITS) ×3 IMPLANT
MANIFOLD NEPTUNE II (INSTRUMENTS) ×3 IMPLANT
PACK CYSTO (CUSTOM PROCEDURE TRAY) ×3 IMPLANT
PLUG CATH AND CAP STER (CATHETERS) IMPLANT
SYR 30ML LL (SYRINGE) ×3 IMPLANT
SYRINGE IRR TOOMEY STRL 70CC (SYRINGE) IMPLANT
WATER STERILE IRR 3000ML UROMA (IV SOLUTION) ×3 IMPLANT

## 2016-12-22 NOTE — Transfer of Care (Signed)
Immediate Anesthesia Transfer of Care Note  Patient: Derrick Rivera  Procedure(s) Performed: Procedure(s): TRANSURETHRAL RESECTION OF BLADDER TUMOR (TURBT) (N/A) CYSTOSCOPY WITH RETROGRADE PYELOGRAM (Bilateral)  Patient Location: PACU  Anesthesia Type:General  Level of Consciousness: awake, alert , oriented and patient cooperative  Airway & Oxygen Therapy: Patient Spontanous Breathing and Patient connected to nasal cannula oxygen  Post-op Assessment: Report given to RN and Post -op Vital signs reviewed and stable  Post vital signs: Reviewed and stable  Last Vitals:  Vitals:   12/22/16 0938  BP: (!) 150/82  Pulse: 64  Resp: 16  Temp: 36.6 C    Last Pain:  Vitals:   12/22/16 0938  TempSrc: Oral      Patients Stated Pain Goal: 5 ([id number])  Complications: No apparent anesthesia complications

## 2016-12-22 NOTE — Anesthesia Postprocedure Evaluation (Addendum)
Anesthesia Post Note  Patient: Derrick Rivera  Procedure(s) Performed: Procedure(s) (LRB): TRANSURETHRAL RESECTION OF BLADDER TUMOR (TURBT) (N/A) CYSTOSCOPY WITH RETROGRADE PYELOGRAM (Bilateral)  Patient location during evaluation: PACU Anesthesia Type: General Level of consciousness: sedated Pain management: pain level controlled Vital Signs Assessment: post-procedure vital signs reviewed and stable Respiratory status: spontaneous breathing and respiratory function stable Cardiovascular status: stable Anesthetic complications: no       Last Vitals:  Vitals:   12/22/16 1245 12/22/16 1300  BP: 129/74 125/79  Pulse: (!) 58 (!) 58  Resp: 13 14  Temp:      Last Pain:  Vitals:   12/22/16 0938  TempSrc: Oral                 SINGER,Davanta DANIEL

## 2016-12-22 NOTE — Anesthesia Preprocedure Evaluation (Addendum)
Anesthesia Evaluation  Patient identified by MRN, date of birth, ID band Patient awake    Reviewed: Allergy & Precautions, H&P , NPO status , Patient's Chart, lab work & pertinent test results  Airway Mallampati: II  TM Distance: >3 FB Neck ROM: Limited    Dental  (+) Teeth Intact, Dental Advisory Given, Poor Dentition   Pulmonary sleep apnea , former smoker,    breath sounds clear to auscultation       Cardiovascular hypertension, + CAD, + Cardiac Stents and + Peripheral Vascular Disease   Rhythm:regular Rate:Normal     Neuro/Psych    GI/Hepatic GERD  ,  Endo/Other    Renal/GU      Musculoskeletal  (+) Arthritis ,   Abdominal   Peds  Hematology   Anesthesia Other Findings   Reproductive/Obstetrics                            Anesthesia Physical Anesthesia Plan  ASA: III  Anesthesia Plan: General   Post-op Pain Management:    Induction: Intravenous  Airway Management Planned: LMA  Additional Equipment:   Intra-op Plan:   Post-operative Plan:   Informed Consent: I have reviewed the patients History and Physical, chart, labs and discussed the procedure including the risks, benefits and alternatives for the proposed anesthesia with the patient or authorized representative who has indicated his/her understanding and acceptance.     Plan Discussed with: CRNA, Anesthesiologist and Surgeon  Anesthesia Plan Comments:         Anesthesia Quick Evaluation

## 2016-12-22 NOTE — H&P (Signed)
Urology Admission H&P  Chief Complaint: bladder cancer  History of Present Illness: Derrick Rivera is a 83yo with tumor recurrance after BCG  Past Medical History:  Diagnosis Date  . Allergic rhinitis   . Bilateral carotid artery stenosis    per last duplex [id number]  RICA [id number],  LICA [id number]  . Bladder cancer [facility]) urologist-  dr Alyson Ingles   Non-invasise High Grade  . Chronic cough    ? lisinopril intolerence  . Coronary artery disease cardiologiat-  dr Shelva Majestic   hx PCI and stenting 2003/  per cardiac cath total occlusion RCA w/ collaterals 1995  . Diverticulosis of colon   . History of adenomatous polyp of colon    tubular adenoma's 1992; 1993; 1997; 2007  . History of colonic polyps   . History of nonmelanoma skin cancer    BCC and SCC right side nose  s/p moh's reseciton and reconstruction 2013/  left ear  01/ 2017/   06/ 2017 in office excision 2 areas on scalp  . History of TIAs takes ASA   pt states has a few what he thinks ia tia's, states last time with symptoms of veritgo and thought process was 04/ 2017  . HTN (hypertension)   . Hypercholesterolemia   . Non-Hodgkin lymphoma, unspecified, unspecified site [facility]) oncologist-  dr Benay Spice--  pt in clinical remission since 2000   dx 1996  no specific therapy since 2000  . OA (osteoarthritis)   . OSA (obstructive sleep apnea)    moderate per study 08-10-2013--  intolerant cpap  . Prostate nodule   . Thrombocytopenia (HCC)    chronic mild  . Wears glasses    Past Surgical History:  Procedure Laterality Date  . CARDIOVASCULAR STRESS TEST  07-25-2016 dr Shelva Majestic   Intermediate risk  nuclear study w/ a medium defect of moderate severity present in the basal inferolateral, mid inferlateral , and apical inferior location-- Findings consistent w/ ischemia and prior MI w/ per-infarct ischemia-- Per Dr [name] mild inferior ischemia; similar defect in 2014 study but less scar on present study:  Nuclear stress EF 52%  . CATARACT  EXTRACTION W/ INTRAOCULAR LENS  IMPLANT, BILATERAL  2013  . CORONARY ANGIOPLASTY  1995   total occlusion RCA w/ collaterals;  PTCA 2nd marginal LCFX  . CORONARY ANGIOPLASTY WITH STENT PLACEMENT  01-05-2002  dr Shelva Majestic   Complex high-speed rotational atherectomy of entire LAD multiple sites and stenting proximal and mid LAD/  no re-stenosis in prior CFX marginal dilatation site and RCA occluded w/ left to right collaterals,  ef 57%  . CYSTOSCOPY/RETROGRADE/URETEROSCOPY/STONE EXTRACTION WITH BASKET Bilateral 05/05/2016   Procedure: CYSTOSCOPY WITH RETROGRADE PYELOGRAM;  Surgeon: Cleon Gustin, MD;  Location: [facility];  Service: Urology;  Laterality: Bilateral;  . MOHS SURGERY  2013   at [facility]   nose -- w/ Reconstructive surgery same year  . MOHS SURGERY  01/ 2017   left ear  . REFRACTIVE SURGERY Left 05/ 2017  . TONSILLECTOMY  age 75  . TOTAL KNEE ARTHROPLASTY Left 03-07-2002  . TOTAL SHOULDER ARTHROPLASTY Left 04-30-2005  . TRANSTHORACIC ECHOCARDIOGRAM  04-29-2012   borderline inferior wall hypokinesis, ef 65%/  mild dilated RV/  mild to moderate AV calcification without stenosis/  mild AR, TR, and  Derrick/  moderate PR/  mild aortic root dilatation  . TRANSURETHRAL RESECTION OF BLADDER TUMOR WITH GYRUS (TURBT-GYRUS) N/A 05/05/2016   Procedure: TRANSURETHRAL RESECTION OF BLADDER TUMOR WITH GYRUS (TURBT-GYRUS);  Surgeon: Cleon Gustin,  MD;  Location: [facility];  Service: Urology;  Laterality: N/A;  . TRANSURETHRAL RESECTION OF BLADDER TUMOR WITH GYRUS (TURBT-GYRUS) N/A 06/09/2016   Procedure: TRANSURETHRAL RESECTION OF BLADDER TUMOR WITH GYRUS (TURBT-GYRUS);  Surgeon: Cleon Gustin, MD;  Location: [facility];  Service: Urology;  Laterality: N/A;    Home Medications:  Prescriptions Prior to Admission  Medication Sig Dispense Refill Last Dose  . Ascorbic Acid (VITAMIN C) 500 MG tablet Take 500 mg by mouth every morning.    Past Week at Unknown time  . aspirin EC 81 MG  tablet Take 81 mg by mouth every evening.   12/17/2016 at Unknown time  . atenolol (TENORMIN) 50 MG tablet Take 0.5 tablets (25 mg total) by mouth 2 (two) times daily. 60 tablet 11 12/22/2016 at 0700  . atorvastatin (LIPITOR) 20 MG tablet TAKE 1 TABLET (20 MG TOTAL) BY MOUTH DAILY. (Patient taking differently: TAKE 1 TABLET (20 MG TOTAL) BY MOUTH DAILY.--- takes in pm) 90 tablet 1 12/21/2016 at Unknown time  . B Complex-C-Calcium (B-COMPLEX/VITAMIN C) TABS Take one tablet by mouth every other day  In AM   Past Week at Unknown time  . fish oil-omega-3 fatty acids 1000 MG capsule Take one capsule by mouth daily   Past Week at Unknown time  . fluticasone (FLONASE) 50 MCG/ACT nasal spray Place 2 sprays into both nostrils daily. 16 g 6 12/22/2016 at 0700  . hydrochlorothiazide (MICROZIDE) 12.5 MG capsule Take 1 capsule (12.5 mg total) by mouth daily. (Patient taking differently: Take 12.5 mg by mouth every morning. ) 30 capsule 6 Taking  . isosorbide mononitrate (IMDUR) 60 MG 24 hr tablet Take 1 & 1/2 tablet daily around Noon. 90 tablet 2 12/21/2016 at Unknown time  . lisinopril (PRINIVIL,ZESTRIL) 20 MG tablet Take 20 mg by mouth every morning.    12/21/2016 at Unknown time  . Multiple Vitamins-Minerals (MULTIVITAMIN WITH MINERALS) tablet Take one tablet by mouth every other day  In AM   Past Week at Unknown time  . verapamil (CALAN) 120 MG tablet TAKE ONE TABLET BY MOUTH ONCE DAILY (Patient taking differently: TAKE ONE TABLET BY MOUTH ONCE DAILY--- TAKES IN PM) 30 tablet 6 12/21/2016 at Unknown time   Allergies:  Allergies  Allergen Reactions  . 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 Pancreatic cancer Mother   . Heart disease Father    Social History:  reports that he quit smoking about 50 years ago. His smoking use included Cigarettes. He has a 40.00 pack-year smoking history. He has never used smokeless tobacco. He reports that he does not drink alcohol or use drugs.  Review of Systems  All other systems reviewed and are negative.   Physical Exam:  Vital signs in last 24 hours: Temp:  [97.8 F (36.6 C)] 97.8 F (36.6 C) (01/22 0938) Pulse Rate:  [64] 64 (01/22 0938) Resp:  [16] 16 (01/22 0938) BP: (150)/(82) 150/82 (01/22 0938) SpO2:  [99 %] 99 % (01/22 0938) Weight:  [80.1 kg (176 lb 8 oz)] 80.1 kg (176 lb 8  oz) (01/22 [id number]) Physical Exam  Constitutional: He is oriented to person, place, and time. He appears well-developed and well-nourished.  HENT:  Head: Normocephalic and atraumatic.  Eyes: EOM are normal. Pupils are equal, round, and reactive to light.  Neck: Normal range of motion. No thyromegaly present.  Cardiovascular: Normal rate and regular rhythm.   Respiratory: Effort normal. No respiratory distress.  GI: Soft. He exhibits no distension.  Musculoskeletal: Normal range of motion. He exhibits no edema.  Neurological: He is alert and oriented to person, place, and time.  Skin: Skin is warm and dry.  Psychiatric: He has a normal mood and affect. His behavior is normal. Judgment and thought content normal.    Laboratory Data:  Results for orders placed or performed during the hospital encounter of 12/22/16 (from the past 24 hour(s))  I-STAT, chem 8     Status: None   Collection Time: 12/22/16 10:13 AM  Result Value Ref Range   Sodium 141 135 - 145 mmol/L   Potassium 4.0 3.5 - 5.1 mmol/L   Chloride 104 101 - 111 mmol/L   BUN 20 6 - 20 mg/dL   Creatinine, Ser 1.00 0.61 - 1.24 mg/dL   Glucose, Bld 94 65 - 99 mg/dL   Calcium, Ion 1.26 1.15  - 1.40 mmol/L   TCO2 25 0 - 100 mmol/L   Hemoglobin 16.0 13.0 - 17.0 g/dL   HCT 47.0 39.0 - 52.0 %   No results found for this or any previous visit (from the past 240 hour(s)). Creatinine:  Recent Labs  12/22/16 1013  CREATININE 1.00   Baseline Creatinine: 1  Impression/Assessment:  83yo with bladder cancer  Plan:  The risks/benefits/alterantives to TURBt was expalined to the patient and he understands and wishes to proceed with surgery  Nicolette Bang 12/22/2016, 11:40 AM

## 2016-12-22 NOTE — Discharge Instructions (Signed)
Post Anesthesia Home Care Instructions  Activity: Get plenty of rest for the remainder of the day. A responsible adult should stay with you for 24 hours following the procedure.  For the next 24 hours, DO NOT: -Drive a car -Paediatric nurse -Drink alcoholic beverages -Take any medication unless instructed by your physician -Make any legal decisions or sign important papers.  Meals: Start with liquid foods such as gelatin or soup. Progress to regular foods as tolerated. Avoid greasy, spicy, heavy foods. If nausea and/or vomiting occur, drink only clear liquids until the nausea and/or vomiting subsides. Call your physician if vomiting continues.  Special Instructions/Symptoms: Your throat may feel dry or sore from the anesthesia or the breathing tube placed in your throat during surgery. If this causes discomfort, gargle with warm salt water. The discomfort should disappear within 24 hours.  If you had a scopolamine patch placed behind your ear for the management of post- operative nausea and/or vomiting:  1. The medication in the patch is effective for 72 hours, after which it should be removed.  Wrap patch in a tissue and discard in the trash. Wash hands thoroughly with soap and water. 2. You may remove the patch earlier than 72 hours if you experience unpleasant side effects which may include dry mouth, dizziness or visual disturbances. 3. Avoid touching the patch. Wash your hands with soap and water after contact with the patch.   CYSTOSCOPY HOME CARE INSTRUCTIONS  Activity: Rest for the remainder of the day.  Do not drive or operate equipment today.  You may resume normal activities in one to two days as instructed by your physician.   Meals: Drink plenty of liquids and eat light foods such as gelatin or soup this evening.  You may return to a normal meal plan tomorrow.  Return to Work: You may return to work in one to two days or as instructed by your physician.  Special  Instructions / Symptoms: Call your physician if any of these symptoms occur:   -persistent or heavy bleeding  -bleeding which continues after first few urination  -large blood clots that are difficult to pass  -urine stream diminishes or stops completely  -fever equal to or higher than 101 degrees Farenheit.  -cloudy urine with a strong, foul odor  -severe pain  Females should always wipe from front to back after elimination.  You may feel some burning pain when you urinate.  This should disappear with time.  Applying moist heat to the lower abdomen or a hot tub bath may help relieve the pain. \  Follow-Up / Date of Return Visit to Your Physician:  Call for an appointment to arrange follow-up.   Transurethral Resection of Bladder Tumor, Care After Refer to this sheet in the next few weeks. These instructions provide you with information about caring for yourself after your procedure. Your health care provider may also give you more specific instructions. Your treatment has been planned according to current medical practices, but problems sometimes occur. Call your health care provider if you have any problems or questions after your procedure. What can I expect after the procedure? After the procedure, it is common to have:  A small amount of blood in your urine for up to 2 weeks.  Soreness or mild discomfort from your catheter. After your catheter is removed, you may have mild soreness, especially when urinating.  Pain in your lower abdomen. Follow these instructions at home:   Medicines  Take over-the-counter and prescription medicines  only as told by your health care provider.  Do not drive or operate heavy machinery while taking prescription pain medicine.  Do not drive for 24 hours if you received a sedative.  If you were prescribed antibiotic medicine, take it as told by your health care provider. Do not stop taking the antibiotic even if you start to feel  better. Activity  Return to your normal activities as told by your health care provider. Ask your health care provider what activities are safe for you.  Do not lift anything that is heavier than 10 lb (4.5 kg) for as long as told by your health care provider.  Avoid intense physical activity for as long as told by your health care provider.  Walk at least one time every day. This helps to prevent blood clots. You may increase your physical activity gradually as you start to feel better. General instructions  Do not drink alcohol for as long as told by your health care provider. This is especially important if you are taking prescription pain medicines.  Do not take baths, swim, or use a hot tub until your health care provider approves.  If you have a catheter, follow instructions from your health care provider about caring for your catheter and your drainage bag.  Drink enough fluid to keep your urine clear or pale yellow.  Wear compression stockings as told by your health care provider. These stockings help to prevent blood clots and reduce swelling in your legs.  Keep all follow-up visits as told by your health care provider. This is important. Contact a health care provider if:  You have pain that gets worse or does not improve with medicine.  You have blood in your urine for more than 2 weeks.  You have cloudy or bad-smelling urine.  You become constipated. Signs of constipation may include having:  Fewer than three bowel movements in a week.  Difficulty having a bowel movement.  Stools that are dry, hard, or larger than normal.  You have a fever. Get help right away if:  You have:  Severe pain.  Bright-red blood in your urine.  Blood clots in your urine.  A lot of blood in your urine.  Your catheter has been removed and you are not able to urinate.  You have a catheter in place and the catheter is not draining urine. This information is not intended to  replace advice given to you by your health care provider. Make sure you discuss any questions you have with your health care provider. Document Released: 10/29/2015 Document Revised: 07/20/2016 Document Reviewed: 08/09/2015 Elsevier Interactive Patient Education  2017 [employer].

## 2016-12-22 NOTE — Anesthesia Procedure Notes (Signed)
Procedure Name: LMA Insertion Date/Time: 12/22/2016 11:48 AM Performed by: Wanita Chamberlain Pre-anesthesia Checklist: Patient identified, Timeout performed, Emergency Drugs available, Suction available and Patient being monitored Patient Re-evaluated:Patient Re-evaluated prior to inductionOxygen Delivery Method: Circle system utilized Preoxygenation: Pre-oxygenation with 100% oxygen Intubation Type: IV induction Ventilation: Mask ventilation without difficulty LMA Size: 5.0 Number of attempts: 1 Placement Confirmation: breath sounds checked- equal and bilateral and positive ETCO2 Tube secured with: Tape Dental Injury: Teeth and Oropharynx as per pre-operative assessment

## 2016-12-22 NOTE — Op Note (Signed)
.  Preoperative diagnosis: bladder tumor  Postoperative diagnosis: Same  Procedure: 1 cystoscopy 2. bilateral retrograde pyelography 3.  Intraoperative fluoroscopy, under one hour, with interpretation 4.  Clot evacuation 5. Transurethral resection of bladder tumor, small  Attending: Rosie Fate  Anesthesia: General  Estimated blood loss: Minimal  Drains: 22 French foley  Specimens: right lateral wall tumor  Antibiotics: ancef  Findings:  2 1cm papillary right lateral wall tumor.  Ureteral orifices in normal anatomic location. Nor hydronephrosis in either collecting system. Right renal pelvis filling defect consistent with peripelvic cyst on CT scan  Indications: Patient is a 83 year old male/male with a history of bladder tumor.  After discussing treatment options, they decided proceed with transurethral resection of a bladder tumor.  Procedure her in detail: The patient was brought to the operating room and a brief timeout was done to ensure correct patient, correct procedure, correct site.  General anesthesia was administered patient was placed in dorsal lithotomy position.  Their genitalia was then prepped and draped in usual sterile fashion.  A rigid 54 French cystoscope was passed in the urethra and the bladder.  Bladder was inspected and we noted 2 1cm bladder tumors on the right lateral wall.  the ureteral orifices were in the normal orthotopic locations.  a 6 french ureteral catheter was then instilled into the left ureteral orifice.  a gentle retrograde was obtained and findings noted above. We then turned our attention to the right side. a 6 french ureteral catheter was then instilled into the right ureteral orifice.  a gentle retrograde was obtained and findings noted above. We then removed the cystoscope and placed a resectoscope into the bladder. We proceeded to remove the large clot burden from the bladder. Once this was complete we turned our attention to the bladder  tumor. Using the bipolar resectoscope we removed the bladder tumor down to the base. A subsequent muscle deep biopsy was then taken. Hemostasis was then obtained with electrocautery. We then removed the bladder tumor chips and sent them for pathology. We then re-inspected the bladder and found no residula bleeding.  the bladder was then drained, a 22 French foley was placed and this concluded the procedure which was well tolerated by patient.  Complications: None  Condition: Stable, extubated, transferred to PACU  Plan: Patient is to be discharged home and followup in 5 days for foley catheter removal and pathology discussion.

## 2016-12-23 ENCOUNTER — Encounter (HOSPITAL_BASED_OUTPATIENT_CLINIC_OR_DEPARTMENT_OTHER): Payer: Self-pay | Admitting: Urology

## 2016-12-25 ENCOUNTER — Other Ambulatory Visit: Payer: Self-pay | Admitting: Cardiovascular Disease

## 2016-12-25 NOTE — Telephone Encounter (Signed)
Rx request sent to pharmacy.  

## 2016-12-29 DIAGNOSIS — C672 Malignant neoplasm of lateral wall of bladder: Secondary | ICD-10-CM | POA: Diagnosis not present

## 2017-01-01 ENCOUNTER — Telehealth: Payer: Self-pay | Admitting: Family Medicine

## 2017-01-01 NOTE — Telephone Encounter (Signed)
Patient dropped off note stating 1. 2 months off lisinopril plan as of 12/03/16 and cough is much better- hardly any coughing during day, some t night 2. Flonsea- slowing down drainage. Some runny nose but less.  3. BPs off lisinopril occasionally into 90s but averating in 80s. Systolic rarely above [id number].   Roselyn Reef- can you call patient stating that blood pressures look ok to remain off medication? Thanks. Also let him know we are glad he is feeling better

## 2017-01-02 DIAGNOSIS — I209 Angina pectoris, unspecified: Secondary | ICD-10-CM | POA: Diagnosis not present

## 2017-01-02 DIAGNOSIS — L989 Disorder of the skin and subcutaneous tissue, unspecified: Secondary | ICD-10-CM | POA: Diagnosis not present

## 2017-01-02 DIAGNOSIS — Z0000 Encounter for general adult medical examination without abnormal findings: Secondary | ICD-10-CM | POA: Diagnosis not present

## 2017-01-02 DIAGNOSIS — D090 Carcinoma in situ of bladder: Secondary | ICD-10-CM | POA: Diagnosis not present

## 2017-01-02 DIAGNOSIS — Z7982 Long term (current) use of aspirin: Secondary | ICD-10-CM | POA: Diagnosis not present

## 2017-01-02 DIAGNOSIS — Z79899 Other long term (current) drug therapy: Secondary | ICD-10-CM | POA: Diagnosis not present

## 2017-01-02 DIAGNOSIS — I10 Essential (primary) hypertension: Secondary | ICD-10-CM | POA: Diagnosis not present

## 2017-01-02 DIAGNOSIS — E785 Hyperlipidemia, unspecified: Secondary | ICD-10-CM | POA: Diagnosis not present

## 2017-01-02 DIAGNOSIS — R600 Localized edema: Secondary | ICD-10-CM | POA: Diagnosis not present

## 2017-01-02 DIAGNOSIS — Z87891 Personal history of nicotine dependence: Secondary | ICD-10-CM | POA: Diagnosis not present

## 2017-01-02 NOTE — Telephone Encounter (Signed)
Spoke with patient who verbalized understanding. He will continue to monitor his BP from home. He will report back in a couple of weeks.

## 2017-01-20 DIAGNOSIS — L570 Actinic keratosis: Secondary | ICD-10-CM | POA: Diagnosis not present

## 2017-01-20 DIAGNOSIS — B078 Other viral warts: Secondary | ICD-10-CM | POA: Diagnosis not present

## 2017-01-20 DIAGNOSIS — Z85828 Personal history of other malignant neoplasm of skin: Secondary | ICD-10-CM | POA: Diagnosis not present

## 2017-01-20 DIAGNOSIS — C44219 Basal cell carcinoma of skin of left ear and external auricular canal: Secondary | ICD-10-CM | POA: Diagnosis not present

## 2017-01-20 DIAGNOSIS — X32XXXD Exposure to sunlight, subsequent encounter: Secondary | ICD-10-CM | POA: Diagnosis not present

## 2017-01-20 DIAGNOSIS — Z08 Encounter for follow-up examination after completed treatment for malignant neoplasm: Secondary | ICD-10-CM | POA: Diagnosis not present

## 2017-01-22 ENCOUNTER — Telehealth: Payer: Self-pay | Admitting: Family Medicine

## 2017-01-22 NOTE — Telephone Encounter (Signed)
Patient dropped off blood pressures.   Blood pressure averaging certainly under [id number] with several readings even into the 110s. Rarely hits 140 or 90 (but using wrist monitor)   Reports mostly dry cough when turning over in bed at night. No longer on lisinopril. Still having a fair amount of runnny nose in the daytime.   Derrick Rivera- can you check with patient to see if he thinks this is an improvement in his cough? If so likely ok to continue to monitor. I also would consider trading flonase 2 sprays each nostril daily for dymista to see if that would help.

## 2017-01-23 ENCOUNTER — Other Ambulatory Visit: Payer: Self-pay

## 2017-01-23 MED ORDER — AZELASTINE-FLUTICASONE 137-50 MCG/ACT NA SUSP: 1.0000 | Freq: Every day | NASAL | 2 refills | Status: DC | PRN

## 2017-01-23 NOTE — Telephone Encounter (Signed)
Called and spoke to patient who stated his cough he thinks is a little bit improved. I did send in the [place] to the pharmacy as he wants to try this. He is aware.

## 2017-01-26 ENCOUNTER — Telehealth: Payer: Self-pay

## 2017-01-26 NOTE — Telephone Encounter (Signed)
Received PA request from Kristopher Oppenheim for [place]. PA submitted & is pending. Key: [id number]

## 2017-01-26 NOTE — Telephone Encounter (Signed)
PA approved, form faxed back to pharmacy. 

## 2017-01-27 DIAGNOSIS — C672 Malignant neoplasm of lateral wall of bladder: Secondary | ICD-10-CM | POA: Diagnosis not present

## 2017-01-27 DIAGNOSIS — Z5111 Encounter for antineoplastic chemotherapy: Secondary | ICD-10-CM | POA: Diagnosis not present

## 2017-02-03 DIAGNOSIS — C672 Malignant neoplasm of lateral wall of bladder: Secondary | ICD-10-CM | POA: Diagnosis not present

## 2017-02-03 DIAGNOSIS — Z5111 Encounter for antineoplastic chemotherapy: Secondary | ICD-10-CM | POA: Diagnosis not present

## 2017-02-10 ENCOUNTER — Ambulatory Visit: Payer: Medicare HMO | Admitting: Cardiovascular Disease

## 2017-02-10 DIAGNOSIS — Z5111 Encounter for antineoplastic chemotherapy: Secondary | ICD-10-CM | POA: Diagnosis not present

## 2017-02-10 DIAGNOSIS — C672 Malignant neoplasm of lateral wall of bladder: Secondary | ICD-10-CM | POA: Diagnosis not present

## 2017-02-16 ENCOUNTER — Encounter: Payer: Self-pay | Admitting: Cardiovascular Disease

## 2017-02-16 ENCOUNTER — Ambulatory Visit (INDEPENDENT_AMBULATORY_CARE_PROVIDER_SITE_OTHER): Payer: Medicare HMO | Admitting: Cardiovascular Disease

## 2017-02-16 VITALS — BP 108/74 | HR 77 | Ht 70.0 in | Wt 180.0 lb

## 2017-02-16 DIAGNOSIS — I700 Atherosclerosis of aorta: Secondary | ICD-10-CM

## 2017-02-16 DIAGNOSIS — I2510 Atherosclerotic heart disease of native coronary artery without angina pectoris: Secondary | ICD-10-CM | POA: Diagnosis not present

## 2017-02-16 DIAGNOSIS — I10 Essential (primary) hypertension: Secondary | ICD-10-CM

## 2017-02-16 DIAGNOSIS — I6523 Occlusion and stenosis of bilateral carotid arteries: Secondary | ICD-10-CM

## 2017-02-16 DIAGNOSIS — M25473 Effusion, unspecified ankle: Secondary | ICD-10-CM | POA: Diagnosis not present

## 2017-02-16 DIAGNOSIS — Z79899 Other long term (current) drug therapy: Secondary | ICD-10-CM | POA: Diagnosis not present

## 2017-02-16 DIAGNOSIS — E785 Hyperlipidemia, unspecified: Secondary | ICD-10-CM | POA: Diagnosis not present

## 2017-02-16 MED ORDER — VERAPAMIL HCL ER 180 MG PO TBCR: 180.0000 mg | EXTENDED_RELEASE_TABLET | Freq: Every day | ORAL | 11 refills | Status: DC

## 2017-02-16 NOTE — Patient Instructions (Signed)
Your physician has recommended you make the following change in your medication:   1.) STOP verapamil 120 mg. This has been replaced with verapamil 180 mg and has been sent to your [facility].  Your physician recommends that you return for lab work fasting. Lab slips has been provided to you today.   Your physician wants you to follow-up in: 6 months or sooner if needed. You will receive a reminder letter in the mail two months in advance. If you don't receive a letter, please call our office to schedule the follow-up appointment.  If you need a refill on your cardiac medications before your next appointment, please call your pharmacy.

## 2017-02-16 NOTE — Progress Notes (Signed)
Patient ID: Derrick Rivera, male   DOB: July 06, 1924, 83 y.o.   MRN: [id number]     HPI: Derrick Rivera is a 83 y.o. male who presents for a 6 month cardiology followup evaluation.  Derrick Rivera has established CAD and in 1995 Derrick Rivera was found to have total occlusion of his RCA. In 2003 Derrick Rivera underwent complex high-speed rotational atherectomy of his entire extremely calcified LAD system at multiple sites. At that time, I placed a 3.0x15 mm and 3.0x12 mm S7 stent in the proximal to mid region. A nuclear perfusion study in June 2012 showed an ejection fraction of 58% with moderate perfusion defect compatible with inferior scar with minimal peri-infarction ischemia concordant with his known mid RCA occlusion. Derrick Rivera underwent a 2 year followup nuclear perfusion study on 07/06/2013 which was unchanged and showed an ejection fraction of 57% with mild inferolateral hypokinesis and a small inferolateral defect consistent with his prior abnormality. There was no significant reversible ischemia.  Additional problems include hypertension, hyperlipidemia, lymphoma followed by Dr. Ammie Dalton, and Derrick Rivera is status post Mohs surgery for skin cancer with reconstructive plastic surgery at [facility].  Derrick Rivera has been diagnosed on PSG to have moderate sleep apnea with an AHI of 27.  Derrick Rivera never went for his CPAP titration and Derrick Rivera does not use CPAP therapy.  Derrick Rivera states Derrick Rivera is sleeping well.  Derrick Rivera wakes up, however, at least 2 times per night.  On a sleep study also was noted to have periodic leg movement disorder during sleep.  Derrick Rivera denies painful restless legs.  Since I saw him, Derrick Rivera denies any episodes of chest pain.  Derrick Rivera denies significant shortness of breath, but with significant activity.  There is mild dyspnea.  Derrick Rivera had undergone surgery on his scalp for squamous cell carcinoma and tolerated this well.  Derrick Rivera was diagnosed with bladder tumor and underwent bladder instillation of chemotherapy.  Derrick Rivera is followed by Dr. Alyson Ingles at [facility] urology.Derrick Rivera states  Derrick Rivera has had 6 treatments of BCG.  In January Derrick Rivera developed a cough and lisinopril was discontinued.  A chest x-ray in January 2018 showed bronchiectatic changes in left basilar scarring.  There was evidence for aortic atherosclerosis and borderline cardiomegaly.  Derrick Rivera admits to swelling of his ankles right greater than left.  Derrick Rivera is unaware of palpitations.  Derrick Rivera presents for evaluation.  n.  Past Medical History:  Diagnosis Date  . Allergic rhinitis   . Bilateral carotid artery stenosis    per last duplex [id number]  RICA 20-25%,  LICA 4-27%  . Bladder cancer [facility]) urologist-  dr Alyson Ingles   Non-invasise High Grade  . Chronic cough    ? lisinopril intolerence  . Coronary artery disease cardiologiat-  dr Shelva Majestic   hx PCI and stenting 2003/  per cardiac cath total occlusion RCA w/ collaterals 1995  . Diverticulosis of colon   . History of adenomatous polyp of colon    tubular adenoma's 1992; 1993; 1997; 2007  . History of colonic polyps   . History of nonmelanoma skin cancer    BCC and SCC right side nose  s/p moh's reseciton and reconstruction 2013/  left ear  01/ 2017/   06/ 2017 in office excision 2 areas on scalp  . History of TIAs takes ASA   pt states has a few what Derrick Rivera thinks ia tia's, states last time with symptoms of veritgo and thought process was 04/ 2017  . HTN (hypertension)   . Hypercholesterolemia   . Non-Hodgkin lymphoma, unspecified, unspecified  site [facility]) oncologist-  dr Benay Spice--  pt in clinical remission since 2000   dx 1996  no specific therapy since 2000  . OA (osteoarthritis)   . OSA (obstructive sleep apnea)    moderate per study 08-10-2013--  intolerant cpap  . Prostate nodule   . Thrombocytopenia (HCC)    chronic mild  . Wears glasses     Past Surgical History:  Procedure Laterality Date  . CARDIOVASCULAR STRESS TEST  07-25-2016 dr Shelva Majestic   Intermediate risk  nuclear study w/ a medium defect of moderate severity present in the basal inferolateral, mid  inferlateral , and apical inferior location-- Findings consistent w/ ischemia and prior MI w/ per-infarct ischemia-- Per Dr [name] mild inferior ischemia; similar defect in 2014 study but less scar on present study:  Nuclear stress EF 52%  . CATARACT EXTRACTION W/ INTRAOCULAR LENS  IMPLANT, BILATERAL  2013  . CORONARY ANGIOPLASTY  1995   total occlusion RCA w/ collaterals;  PTCA 2nd marginal LCFX  . CORONARY ANGIOPLASTY WITH STENT PLACEMENT  01-05-2002  dr Shelva Majestic   Complex high-speed rotational atherectomy of entire LAD multiple sites and stenting proximal and mid LAD/  no re-stenosis in prior CFX marginal dilatation site and RCA occluded w/ left to right collaterals,  ef 57%  . CYSTOSCOPY W/ RETROGRADES Bilateral 12/22/2016   Procedure: CYSTOSCOPY WITH RETROGRADE PYELOGRAM;  Surgeon: Cleon Gustin, MD;  Location: [facility];  Service: Urology;  Laterality: Bilateral;  . CYSTOSCOPY/RETROGRADE/URETEROSCOPY/STONE EXTRACTION WITH BASKET Bilateral 05/05/2016   Procedure: CYSTOSCOPY WITH RETROGRADE PYELOGRAM;  Surgeon: Cleon Gustin, MD;  Location: [facility];  Service: Urology;  Laterality: Bilateral;  . MOHS SURGERY  2013   at [facility]   nose -- w/ Reconstructive surgery same year  . MOHS SURGERY  01/ 2017   left ear  . REFRACTIVE SURGERY Left 05/ 2017  . TONSILLECTOMY  age 53  . TOTAL KNEE ARTHROPLASTY Left 03-07-2002  . TOTAL SHOULDER ARTHROPLASTY Left 04-30-2005  . TRANSTHORACIC ECHOCARDIOGRAM  04-29-2012   borderline inferior wall hypokinesis, ef 65%/  mild dilated RV/  mild to moderate AV calcification without stenosis/  mild AR, TR, and  MR/  moderate PR/  mild aortic root dilatation  . TRANSURETHRAL RESECTION OF BLADDER TUMOR N/A 12/22/2016   Procedure: TRANSURETHRAL RESECTION OF BLADDER TUMOR (TURBT);  Surgeon: Cleon Gustin, MD;  Location[facility];  Service: Urology;  Laterality: N/A;  . TRANSURETHRAL RESECTION OF BLADDER TUMOR WITH GYRUS  (TURBT-GYRUS) N/A 05/05/2016   Procedure: TRANSURETHRAL RESECTION OF BLADDER TUMOR WITH GYRUS (TURBT-GYRUS);  Surgeon: Cleon Gustin, MD;  Location: [facility];  Service: Urology;  Laterality: N/A;  . TRANSURETHRAL RESECTION OF BLADDER TUMOR WITH GYRUS (TURBT-GYRUS) N/A 06/09/2016   Procedure: TRANSURETHRAL RESECTION OF BLADDER TUMOR WITH GYRUS (TURBT-GYRUS);  Surgeon: Cleon Gustin, MD;  Location: [facility];  Service: Urology;  Laterality: N/A;    Allergies  Allergen Reactions  . Penicillins Other (See Comments)    Did not help with strep throat--Unknown reaction  Has patient had a PCN reaction causing immediate rash, facial/tongue/throat swelling, SOB or lightheadedness with hypotension: no Has patient had a PCN reaction causing severe rash involving mucus membranes or skin necrosis: no Has patient had a PCN reaction that required hospitalization yes - was in the army at the time     Current Outpatient Prescriptions  Medication Sig Dispense Refill  . Ascorbic Acid (VITAMIN C) 500 MG tablet Take 500 mg by  mouth every morning.     Marland Kitchen aspirin EC 81 MG tablet Take 81 mg by mouth every evening.    Marland Kitchen atenolol (TENORMIN) 50 MG tablet TAKE 1/2 TABLET (25 MG TOTAL) BY MOUTH 2 (TWO) TIMES DAILY. 90 tablet 10  . atorvastatin (LIPITOR) 20 MG tablet TAKE 1 TABLET (20 MG TOTAL) BY MOUTH DAILY. (Patient taking differently: TAKE 1 TABLET (20 MG TOTAL) BY MOUTH DAILY.--- takes in pm) 90 tablet 1  . Azelastine-Fluticasone 137-50 MCG/ACT SUSP Place 1 spray into the nose daily as needed. 23 g 2  . B Complex-C-Calcium (B-COMPLEX/VITAMIN C) TABS Take one tablet by mouth every other day  In AM    . fish oil-omega-3 fatty acids 1000 MG capsule Take one capsule by mouth daily    . isosorbide mononitrate (IMDUR) 60 MG 24 hr tablet Take 1 & 1/2 tablet daily around Noon. 90 tablet 2  . Multiple Vitamins-Minerals (MULTIVITAMIN WITH MINERALS) tablet Take one tablet by mouth every other day  In AM      . hydrochlorothiazide (MICROZIDE) 12.5 MG capsule Take 1 capsule (12.5 mg total) by mouth daily. (Patient taking differently: Take 12.5 mg by mouth every morning. ) 30 capsule 6  . verapamil (CALAN-SR) 180 MG CR tablet Take 1 tablet (180 mg total) by mouth at bedtime. 30 tablet 11   No current facility-administered medications for this visit.     Social History   Social History  . Marital status: Married    Spouse name: Statistician  . Number of children: 3  . Years of education: N/A   Occupational History  . retired-sales Chief Financial Officer    Social History Main Topics  . Smoking status: Former Smoker    Packs/day: 2.00    Years: 20.00    Types: Cigarettes    Quit date: 12/01/1966  . Smokeless tobacco: Never Used  . Alcohol use No  . Drug use: No  . Sexual activity: Not on file   Other Topics Concern  . Not on file   Social History Narrative   Married 61 years in 2015. 3 kids. 5 grandkids.       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 Pancreatic cancer Mother   . Heart disease Father    Social history is notable that Derrick Rivera is married has 3 children 5 grandchildren. There is a remote tobacco history but Derrick Rivera quit over 40 years ago.   ROS General: Negative; No fevers, chills, or night sweats;  HEENT: Negative; No changes in vision or hearing, sinus congestion, difficulty swallowing Pulmonary: Negative; No cough, wheezing, shortness of breath, hemoptysis Cardiovascular: Negative; No chest pain, presyncope, syncope, palpitations GI: Negative; No nausea, vomiting, diarrhea, or abdominal pain GU: Negative; No dysuria, hematuria, or difficulty voiding Musculoskeletal: Negative; no myalgias, joint pain, or  weakness Hematologic/Oncology: Status post plastic surgery for skin was cancer with scarring of his nose.  History of lymphoma; no easy bruising, bleeding Endocrine: Negative; no heat/cold intolerance; no diabetes Neuro: Occasional vertigo Skin: Negative; No rashes or skin lesions Psychiatric: Negative; No behavioral problems, depression Sleep: Positive for obstructive sleep apnea and periodic limb movement disorder.  No hypnogognic hallucinations, no cataplexy Other comprehensive 14 point  system review is negative.   PE BP 108/74   Pulse 77   Ht 5' 10" (1.778 m)   Wt 180 lb (81.6 kg)   BMI 25.83 kg/m   Repeat blood pressure 130/70.  Wt Readings from Last 3 Encounters:  02/16/17 180 lb (81.6 kg)  12/22/16 176 lb 8 oz (80.1 kg)  12/03/16 181 lb 9.6 oz (82.4 kg)   General: Alert, oriented, no distress.  Skin: normal turgor, no rashes HEENT: Normocephalic, atraumatic. Pupils round and reactive; sclera anicteric;no lid lag.  Nose without nasal septal hypertrophy, scarring secondary to prior nasal surgery. Mouth/Parynx benign; Mallinpatti scale 3 Neck: No JVD, no carotid bruits  Lungs: clear to ausculatation and percussion; no wheezing or rales Chest wall: Nontender to palpation Heart: RRR, s1 s2 normal 1/6 systolic murmur; no diastolic murmur.  No S3 or S4 gallop.  No rubs, thrills, or cc Abdomen: Mild diastases recti. No renal artery bruits. soft, nontender; no hepatosplenomehaly, BS+; abdominal aorta nontender and not dilated by palpation. Back: No CVA tenderness Pulses 2+ Extremities: Mild right ankle swelling: no clubbing cyanosis, Homan's sign negative  Neurologic: grossly nonfocal Psychological: Normal affect and mood  ECG (independently read by me): Normal sinus rhythm at 77 bpm.  PVC.  Normal intervals.  No significant ST changes.  September 2017 ECG (independently read by me): Sinus rhythm with an isolated PVC.  QTC interval 431 ms.  Ventricular rate 67.  October 2015  ECG (independently read by me): Sinus bradycardia 55 beats per minute.  Borderline first degree AV block with a PR interval of 202 ms.  No significant ST-T changes.  Prior August 2014 ECG: Sinus rhythm at 68 beats per minute. Intervals normal.  LABS: BMP Latest Ref Rng & Units 12/22/2016 08/27/2016 07/14/2016  Glucose 65 - 99 mg/dL 94 89 86  BUN 6 - 20 mg/dL _0 Creatinine 0.61 - 1.24 mg/dL 1.00 1.03 0.83  Sodium 135 - 145 mmol/L 141 140 143  Potassium 3.5 - 5.1 mmol/L 4.0 4.5 5.2  Chloride 101 - 111 mmol/L 104 102 108  CO2 20 - 31 mmol/L - 27 29  Calcium 8.6 - 10.3 mg/dL - 10.0 9.3   Hepatic Function Latest Ref Rng & Units 07/14/2016 09/17/2015 09/04/2014  Total Protein 6.1 - 8.1 g/dL 6.1 6.4 6.5  Albumin 3.6 - 5.1 g/dL 4.0 4.1 4.4  AST 10 - 35 U/L _1 ALT 9 - 46 U/L _2 Alk Phosphatase 40 - 115 U/L 71 67 61  Total Bilirubin 0.2 - 1.2 mg/dL 0.8 1.1 0.7  Bilirubin, Direct 0.0 - 0.3 mg/dL - - -   CBC Latest Ref Rng & Units 12/22/2016 07/14/2016 06/09/2016  WBC 3.8 - 10.8 K/uL - 8.4 -  Hemoglobin 13.0 - 17.0 g/dL 16.0 15.4 14.3  Hematocrit 39.0 - 52.0 % 47.0 44.3 42.0  Platelets 140 - 400 K/uL - 133(L) -   Lab Results  Component Value Date   MCV 97.8 07/14/2016   MCV 96.4 04/30/2016   MCV 98.1 (H) 11/01/2015   Lab Results  Component Value Date   TSH 2.11 07/14/2016   Lipid Panel     Component Value Date/Time   CHOL 156 07/14/2016 0907   TRIG 123 07/14/2016 0907   HDL 62 07/14/2016 0907   CHOLHDL 2.5 07/14/2016 0907   VLDL 25 07/14/2016 0907   LDLCALC 69 07/14/2016 0907   RADIOLOGY:  12/03/2016.  Chest x-ray   Borderline enlargement of cardiac silhouette.  Bronchitic changes with LEFT basilar scarring.  Aortic atherosclerosis.  IMPRESSION:  1. Coronary artery disease involving native coronary artery of native heart without angina pectoris   2. Essential hypertension   3. Medication management   4. Hyperlipidemia with target LDL less than 70    5. Carotid artery plaque, bilateral   6. Aortic atherosclerosis ([place])   7. Ankle edema     ASSESSMENT AND PLAN: Mr. Daughtridge  is an 83 year old white male who has known RCA occlusion by initial catheterization in 1995. Derrick Rivera is 15 years status post complex high-speed rotational appendectomy and stenting of his proximal and mid LAD in 2003.  A nuclear study in 2014 was unchanged from 2 years previously and showed an area of small inferolateral scar. No significant ischemia was demonstrated.  . Remotely Derrick Rivera had experienced some mild vertigo-like symptoms. Carotid studies on 09/18/2015 revealed stable plaque being 40-59% in the right internal carotid and less than 39% in the left internal carotid.   Vertebral arteries were patent with antegrade flow and normal subclavian arteries bilaterally.  On 07/25/2016 Derrick Rivera underwent a follow-up nuclear perfusion study.  This was unchanged from the 2014 study and showed a defect in the inferolateral and apical inferior wall consistent with scar/ischemia, felt to be intermediate risk.  Ejection fraction was 52%.  When compared to the 2014 study, there appeared to be less scar on the present study.  I last saw him, Derrick Rivera was taken off lisinopril due to cough.  Derrick Rivera presently is on atenolol 50 mg, atorvastatin 20 mg, HCTZ 12.5 mg, isosorbide 90 mg, and verapamil 120 mg.  His blood pressure when rechecked by me was 140/76.  I have recommended slight titration of verapamil to 180 mg at bedtime.  Derrick Rivera has been taking HCTZ for his ankle edema.  Derrick Rivera will undergo a complete set of fasting laboratory.  Derrick Rivera continues to be on atorvastatin for hyperlipidemia with target LDL less than 70.  Adjustments to his medications will be made if needed.  I will see him in 6 months for reevaluation.  Troy Sine, MD, [facility]  02/18/2017 7:35 PM

## 2017-02-17 DIAGNOSIS — C672 Malignant neoplasm of lateral wall of bladder: Secondary | ICD-10-CM | POA: Diagnosis not present

## 2017-02-17 DIAGNOSIS — Z5111 Encounter for antineoplastic chemotherapy: Secondary | ICD-10-CM | POA: Diagnosis not present

## 2017-02-24 DIAGNOSIS — C672 Malignant neoplasm of lateral wall of bladder: Secondary | ICD-10-CM | POA: Diagnosis not present

## 2017-02-24 DIAGNOSIS — Z5111 Encounter for antineoplastic chemotherapy: Secondary | ICD-10-CM | POA: Diagnosis not present

## 2017-02-25 DIAGNOSIS — C44622 Squamous cell carcinoma of skin of right upper limb, including shoulder: Secondary | ICD-10-CM | POA: Diagnosis not present

## 2017-02-25 DIAGNOSIS — Z08 Encounter for follow-up examination after completed treatment for malignant neoplasm: Secondary | ICD-10-CM | POA: Diagnosis not present

## 2017-02-25 DIAGNOSIS — Z85828 Personal history of other malignant neoplasm of skin: Secondary | ICD-10-CM | POA: Diagnosis not present

## 2017-02-27 DIAGNOSIS — I2510 Atherosclerotic heart disease of native coronary artery without angina pectoris: Secondary | ICD-10-CM | POA: Diagnosis not present

## 2017-02-27 DIAGNOSIS — Z79899 Other long term (current) drug therapy: Secondary | ICD-10-CM | POA: Diagnosis not present

## 2017-02-27 DIAGNOSIS — I10 Essential (primary) hypertension: Secondary | ICD-10-CM | POA: Diagnosis not present

## 2017-02-27 LAB — LIPID PANEL
Cholesterol: 138 mg/dL (ref <200)
HDL: 56 mg/dL (ref >40)
LDL CALC: 62 mg/dL (ref <100)
Total CHOL/HDL Ratio: 2.5 Ratio (ref <5.0)
Triglycerides: 101 mg/dL (ref <150)
VLDL: 20 mg/dL (ref <30)

## 2017-02-27 LAB — COMPREHENSIVE METABOLIC PANEL
ALT: 17 U/L (ref 9–46)
AST: 23 U/L (ref 10–35)
Albumin: 3.8 g/dL (ref 3.6–5.1)
Alkaline Phosphatase: 65 U/L (ref 40–115)
BILIRUBIN TOTAL: 0.9 mg/dL (ref 0.2–1.2)
BUN: 21 mg/dL (ref 7–25)
CHLORIDE: 106 mmol/L (ref 98–110)
CO2: 28 mmol/L (ref 20–31)
CREATININE: 0.95 mg/dL (ref 0.70–1.11)
Calcium: 9.5 mg/dL (ref 8.6–10.3)
GLUCOSE: 90 mg/dL (ref 65–99)
Potassium: 4.4 mmol/L (ref 3.5–5.3)
SODIUM: 143 mmol/L (ref 135–146)
Total Protein: 6 g/dL — ABNORMAL LOW (ref 6.1–8.1)

## 2017-02-27 LAB — CBC
HCT: 44.5 % (ref 38.5–50.0)
Hemoglobin: 14.9 g/dL (ref 13.2–17.1)
MCH: 33.2 pg — AB (ref 27.0–33.0)
MCHC: 33.5 g/dL (ref 32.0–36.0)
MCV: 99.1 fL (ref 80.0–100.0)
MPV: 9.8 fL (ref 7.5–12.5)
PLATELETS: 155 10*3/uL (ref 140–400)
RBC: 4.49 MIL/uL (ref 4.20–5.80)
RDW: 13.6 % (ref 11.0–15.0)
WBC: 10.4 10*3/uL (ref 3.8–10.8)

## 2017-02-27 LAB — TSH: TSH: 2.29 m[IU]/L (ref 0.40–4.50)

## 2017-03-03 DIAGNOSIS — Z5111 Encounter for antineoplastic chemotherapy: Secondary | ICD-10-CM | POA: Diagnosis not present

## 2017-03-03 DIAGNOSIS — C672 Malignant neoplasm of lateral wall of bladder: Secondary | ICD-10-CM | POA: Diagnosis not present

## 2017-03-09 ENCOUNTER — Encounter: Payer: Self-pay | Admitting: Family Medicine

## 2017-03-09 ENCOUNTER — Ambulatory Visit (INDEPENDENT_AMBULATORY_CARE_PROVIDER_SITE_OTHER): Payer: Medicare HMO | Admitting: Family Medicine

## 2017-03-09 VITALS — BP 132/82 | HR 71 | Temp 99.0°F | Ht 70.0 in | Wt 181.8 lb

## 2017-03-09 DIAGNOSIS — J069 Acute upper respiratory infection, unspecified: Secondary | ICD-10-CM | POA: Diagnosis not present

## 2017-03-09 MED ORDER — PREDNISONE 20 MG PO TABS: ORAL_TABLET | ORAL | 0 refills | Status: DC

## 2017-03-09 NOTE — Patient Instructions (Signed)
Upper Respiratory infection History and exam today are suggestive of viral infection most likely due to upper respiratory infection. Symptomatic treatment with: delsym for cough, continued dymista. Would add mucinex. Will also take prednisone to see if we can reduce inflammation and irritation in throat.   He will prompt [place] immediately if fever, shortness of breath, or if symptoms last more than another week.   Likely course of 1-2 weeks. Patient is contagious and advised good handwashing and consideration of mask If going to be in public places.   Finally, we reviewed reasons to return to care including if symptoms worsen or persist or new concerns arise- once again particularly shortness of breath or fever.

## 2017-03-09 NOTE — Progress Notes (Signed)
Pre visit review using our clinic review tool, if applicable. No additional management support is needed unless otherwise documented below in the visit note. 

## 2017-03-09 NOTE — Progress Notes (Signed)
PCP: [name], MD  Subjective:  Derrick Rivera is a 83 y.o. year old very pleasant male patient who presents with Upper Respiratory infection symptoms including nasal congestion which is clear, sore throat, cough which is dry. Feels some chest congestion starting yesterday. Feels fatigued but no SOB  -started: 4 days ago, symptoms are worsening -previous treatments: delysym helps spome. Taking dymista but not sure helps more with rhinorrhea than flonase -sick contacts/travel/risks: denies flu exposure.  -Hx of: allergies  ROS-denies fever (other htan mild subjective fever at beginning of illness), SOB or DOE or wheeze, NVD, tooth pain  Pertinent Past Medical History-  Patient Active Problem List   Diagnosis Date Noted  . Prostate nodule 08/10/2015    Priority: High  . NON-HODGKIN'S LYMPHOMA 07/09/2008    Priority: High  . Coronary artery disease 06/26/2008    Priority: High  . Obstructive sleep apnea hypopnea, moderate 09/16/2014    Priority: Medium  . Hyperlipidemia LDL goal <70 09/16/2014    Priority: Medium  . THROMBOCYTOPENIA 07/09/2008    Priority: Medium  . Essential hypertension 06/26/2008    Priority: Medium  . Basal cell carcinoma 07/31/2014    Priority: Low  . Laceration 06/13/2014    Priority: Low  . Herpes zoster 03/30/2012    Priority: Low  . Allergic rhinitis 06/27/2008    Priority: Low  . COLONIC POLYPS 06/26/2008    Priority: Low  . Chronic venous insufficiency 06/26/2008    Priority: Low  . GERD 06/26/2008    Priority: Low  . DEGENERATIVE JOINT DISEASE 06/26/2008    Priority: Low    Medications- reviewed  Current Outpatient Prescriptions  Medication Sig Dispense Refill  . Ascorbic Acid (VITAMIN C) 500 MG tablet Take 500 mg by mouth every morning.     Marland Kitchen aspirin EC 81 MG tablet Take 81 mg by mouth every evening.    Marland Kitchen atenolol (TENORMIN) 50 MG tablet TAKE 1/2 TABLET (25 MG TOTAL) BY MOUTH 2 (TWO) TIMES DAILY. 90 tablet 10  . atorvastatin (LIPITOR)  20 MG tablet TAKE 1 TABLET (20 MG TOTAL) BY MOUTH DAILY. (Patient taking differently: TAKE 1 TABLET (20 MG TOTAL) BY MOUTH DAILY.--- takes in pm) 90 tablet 1  . Azelastine-Fluticasone 137-50 MCG/ACT SUSP Place 1 spray into the nose daily as needed. 23 g 2  . B Complex-C-Calcium (B-COMPLEX/VITAMIN C) TABS Take one tablet by mouth every other day  In AM    . fish oil-omega-3 fatty acids 1000 MG capsule Take one capsule by mouth daily    . isosorbide mononitrate (IMDUR) 60 MG 24 hr tablet Take 1 & 1/2 tablet daily around Noon. 90 tablet 2  . Multiple Vitamins-Minerals (MULTIVITAMIN WITH MINERALS) tablet Take one tablet by mouth every other day  In AM    . verapamil (CALAN-SR) 180 MG CR tablet Take 1 tablet (180 mg total) by mouth at bedtime. 30 tablet 11  . hydrochlorothiazide (MICROZIDE) 12.5 MG capsule Take 1 capsule (12.5 mg total) by mouth daily. (Patient taking differently: Take 12.5 mg by mouth every morning. ) 30 capsule 6   No current facility-administered medications for this visit.     Objective: BP 132/82 (BP Location: Left Arm, Patient Position: Sitting, Cuff Size: Large)   Pulse 71   Temp 99 F (37.2 C) (Oral)   Ht 5\' 10"  (1.778 m)   Wt 181 lb 12.8 oz (82.5 kg)   SpO2 96%   BMI 26.09 kg/m  Gen: NAD, resting comfortably HEENT: Turbinates erythematous, TM normal,  pharynx mildly erythematous with no tonsilar exudate or edema, no sinus tenderness CV: RRR no murmurs rubs or gallops Lungs: CTAB no crackles, wheeze, rhonchi Abdomen: soft/nontender/nondistended/normal bowel sounds. No rebound or guarding.  Ext: no edema Skin: warm, dry, no rash Neuro: grossly normal, moves all extremities  Assessment/Plan:  Upper Respiratory infection History and exam today are suggestive of viral infection most likely due to upper respiratory infection. Symptomatic treatment with: delsym for cough, continued dymista. Would add mucinex. Will also take prednisone to see if we can reduce  inflammation and irritation in throat.   He will prompt [place] immediately if fever, shortness of breath, or if symptoms last more than another week. High risk patient at his age- would consider doxycycline if not better in a week or so. Does have some sinus congestion and worry about sinusitis but doxycycline would cover reasonably well for atypical bacterial respiratory infections as well  Likely course of 1-2 weeks. Patient is contagious and advised good handwashing and consideration of mask If going to be in public places.   Finally, we reviewed reasons to return to care including if symptoms worsen or persist or new concerns arise- once again particularly shortness of breath or fever.  Meds ordered this encounter  Medications  . predniSONE (DELTASONE) 20 MG tablet    Sig: Take 1 tablet by mouth daily for 5 days    Dispense:  5 tablet    Refill:  0    [name], MD

## 2017-03-10 ENCOUNTER — Telehealth: Payer: Self-pay

## 2017-03-10 ENCOUNTER — Encounter: Payer: Self-pay | Admitting: *Deleted

## 2017-03-10 NOTE — Telephone Encounter (Signed)
Spoke with patient and let him know that Dr. Yong Channel sent his prednisone into the pharmacy yesterday evening. He verbalized understanding and stated that he will go by this morning and pick up his medication.

## 2017-03-26 DIAGNOSIS — C672 Malignant neoplasm of lateral wall of bladder: Secondary | ICD-10-CM | POA: Diagnosis not present

## 2017-04-02 ENCOUNTER — Other Ambulatory Visit: Payer: Self-pay | Admitting: Cardiovascular Disease

## 2017-04-02 DIAGNOSIS — I2510 Atherosclerotic heart disease of native coronary artery without angina pectoris: Secondary | ICD-10-CM

## 2017-04-02 DIAGNOSIS — I10 Essential (primary) hypertension: Secondary | ICD-10-CM

## 2017-04-07 DIAGNOSIS — R69 Illness, unspecified: Secondary | ICD-10-CM | POA: Diagnosis not present

## 2017-04-21 DIAGNOSIS — M21371 Foot drop, right foot: Secondary | ICD-10-CM | POA: Diagnosis not present

## 2017-04-21 DIAGNOSIS — M5441 Lumbago with sciatica, right side: Secondary | ICD-10-CM | POA: Diagnosis not present

## 2017-04-21 DIAGNOSIS — G8929 Other chronic pain: Secondary | ICD-10-CM | POA: Diagnosis not present

## 2017-04-21 DIAGNOSIS — M5442 Lumbago with sciatica, left side: Secondary | ICD-10-CM | POA: Diagnosis not present

## 2017-04-21 DIAGNOSIS — M4807 Spinal stenosis, lumbosacral region: Secondary | ICD-10-CM | POA: Diagnosis not present

## 2017-04-21 DIAGNOSIS — M21372 Foot drop, left foot: Secondary | ICD-10-CM | POA: Diagnosis not present

## 2017-04-23 ENCOUNTER — Other Ambulatory Visit: Payer: Self-pay | Admitting: Cardiology

## 2017-04-24 ENCOUNTER — Other Ambulatory Visit: Payer: Self-pay | Admitting: Orthopedic Surgery

## 2017-04-24 DIAGNOSIS — M5442 Lumbago with sciatica, left side: Principal | ICD-10-CM

## 2017-04-24 DIAGNOSIS — M5441 Lumbago with sciatica, right side: Principal | ICD-10-CM

## 2017-04-24 DIAGNOSIS — G8929 Other chronic pain: Secondary | ICD-10-CM

## 2017-04-24 NOTE — Telephone Encounter (Signed)
REFILL 

## 2017-04-27 ENCOUNTER — Encounter (HOSPITAL_COMMUNITY): Payer: Self-pay | Admitting: Emergency Medicine

## 2017-04-27 ENCOUNTER — Emergency Department (HOSPITAL_COMMUNITY)
Admission: EM | Admit: 2017-04-27 | Discharge: 2017-04-27 | Disposition: A | Discharge status: Home / Self Care | Payer: Medicare HMO | Attending: Emergency Medicine | Admitting: Emergency Medicine

## 2017-04-27 ENCOUNTER — Emergency Department (HOSPITAL_COMMUNITY): Payer: Medicare HMO

## 2017-04-27 DIAGNOSIS — S0191XA Laceration without foreign body of unspecified part of head, initial encounter: Secondary | ICD-10-CM | POA: Insufficient documentation

## 2017-04-27 DIAGNOSIS — Y929 Unspecified place or not applicable: Secondary | ICD-10-CM | POA: Diagnosis not present

## 2017-04-27 DIAGNOSIS — Z7982 Long term (current) use of aspirin: Secondary | ICD-10-CM | POA: Insufficient documentation

## 2017-04-27 DIAGNOSIS — Z23 Encounter for immunization: Secondary | ICD-10-CM | POA: Diagnosis not present

## 2017-04-27 DIAGNOSIS — Z79899 Other long term (current) drug therapy: Secondary | ICD-10-CM | POA: Diagnosis not present

## 2017-04-27 DIAGNOSIS — S0990XA Unspecified injury of head, initial encounter: Secondary | ICD-10-CM | POA: Insufficient documentation

## 2017-04-27 DIAGNOSIS — Y939 Activity, unspecified: Secondary | ICD-10-CM | POA: Insufficient documentation

## 2017-04-27 DIAGNOSIS — I2510 Atherosclerotic heart disease of native coronary artery without angina pectoris: Secondary | ICD-10-CM | POA: Insufficient documentation

## 2017-04-27 DIAGNOSIS — S01312A Laceration without foreign body of left ear, initial encounter: Secondary | ICD-10-CM | POA: Diagnosis not present

## 2017-04-27 DIAGNOSIS — Y999 Unspecified external cause status: Secondary | ICD-10-CM | POA: Diagnosis not present

## 2017-04-27 DIAGNOSIS — Z96612 Presence of left artificial shoulder joint: Secondary | ICD-10-CM | POA: Diagnosis not present

## 2017-04-27 DIAGNOSIS — I10 Essential (primary) hypertension: Secondary | ICD-10-CM | POA: Diagnosis not present

## 2017-04-27 DIAGNOSIS — Z955 Presence of coronary angioplasty implant and graft: Secondary | ICD-10-CM | POA: Insufficient documentation

## 2017-04-27 DIAGNOSIS — S01511A Laceration without foreign body of lip, initial encounter: Secondary | ICD-10-CM

## 2017-04-27 DIAGNOSIS — S0183XA Puncture wound without foreign body of other part of head, initial encounter: Secondary | ICD-10-CM | POA: Diagnosis not present

## 2017-04-27 DIAGNOSIS — W0110XA Fall on same level from slipping, tripping and stumbling with subsequent striking against unspecified object, initial encounter: Secondary | ICD-10-CM | POA: Diagnosis not present

## 2017-04-27 DIAGNOSIS — Z8551 Personal history of malignant neoplasm of bladder: Secondary | ICD-10-CM | POA: Insufficient documentation

## 2017-04-27 DIAGNOSIS — Z96652 Presence of left artificial knee joint: Secondary | ICD-10-CM | POA: Insufficient documentation

## 2017-04-27 DIAGNOSIS — S0083XA Contusion of other part of head, initial encounter: Secondary | ICD-10-CM

## 2017-04-27 DIAGNOSIS — T07XXXA Unspecified multiple injuries, initial encounter: Secondary | ICD-10-CM

## 2017-04-27 DIAGNOSIS — G459 Transient cerebral ischemic attack, unspecified: Secondary | ICD-10-CM | POA: Diagnosis not present

## 2017-04-27 DIAGNOSIS — S0181XA Laceration without foreign body of other part of head, initial encounter: Secondary | ICD-10-CM

## 2017-04-27 MED ORDER — CHLORHEXIDINE GLUCONATE 0.12 % MT SOLN: 15.0000 mL | Freq: Two times a day (BID) | OROMUCOSAL | 0 refills | Status: DC

## 2017-04-27 MED ORDER — TETANUS-DIPHTH-ACELL PERTUSSIS 5-2.5-18.5 LF-MCG/0.5 IM SUSP
0.5000 mL | Freq: Once | INTRAMUSCULAR | Status: AC
  Administered 2017-04-27: 0.5 mL via INTRAMUSCULAR
  Filled 2017-04-27: qty 0.5

## 2017-04-27 MED ORDER — ACETAMINOPHEN 325 MG PO TABS
650.0000 mg | ORAL_TABLET | Freq: Once | ORAL | Status: AC
  Administered 2017-04-27: 650 mg via ORAL
  Filled 2017-04-27: qty 2

## 2017-04-27 MED ORDER — LIDOCAINE HCL (PF) 1 % IJ SOLN
10.0000 mL | Freq: Once | INTRAMUSCULAR | Status: AC
  Administered 2017-04-27: 10 mL via INTRADERMAL
  Filled 2017-04-27: qty 30

## 2017-04-27 MED ORDER — CLINDAMYCIN HCL 300 MG PO CAPS: 300.0000 mg | ORAL_CAPSULE | Freq: Three times a day (TID) | ORAL | 0 refills | Status: DC

## 2017-04-27 MED ORDER — BACITRACIN ZINC 500 UNIT/GM EX OINT: TOPICAL_OINTMENT | Freq: Two times a day (BID) | CUTANEOUS | Status: DC

## 2017-04-27 MED ORDER — BACITRACIN ZINC 500 UNIT/GM EX OINT
TOPICAL_OINTMENT | CUTANEOUS | Status: AC
  Administered 2017-04-27: 1
  Filled 2017-04-27: qty 0.9

## 2017-04-27 NOTE — ED Notes (Signed)
ED Provider at bedside. 

## 2017-04-27 NOTE — Discharge Instructions (Addendum)
Follow-up with your primary care doctor in the next 24-48 hours for further evaluation.   Keep the wounds clean and dry. You can gently wash them with soap and water. Make sure you pat the wounds dry before applying any dressing.   Use the antibacterial mouthwash as directed.   Take the antibiotics as directed.  Return to the Emergency Department for any worsening pain, bleeding, redness/swelling of the wounds, fever or any other worsening or concerning symptoms.

## 2017-04-27 NOTE — ED Notes (Signed)
Patient a/o x4 and denies pain upon discharge. RN reviewed AVS and pt given opportunity to ask questions.

## 2017-04-27 NOTE — ED Triage Notes (Addendum)
Pt verbalizes "tripping on drop cord" resulting in facial contact to refrigerator. Pt has frontal/posterior lacerations to left ear, laceration to left bottom lip, and laceration to left chin. Pt denies LOC; pt takes baby aspirin today. Pt denies neck pain or hearing loss.

## 2017-04-27 NOTE — ED Notes (Signed)
MEDICATION AT BEDSIDE

## 2017-04-27 NOTE — ED Notes (Signed)
EDPA Provider at bedside. 

## 2017-04-27 NOTE — ED Notes (Signed)
Spoke with Ellender Hose MD regarding pt status and complaint. See verbal orders placed.

## 2017-04-27 NOTE — ED Provider Notes (Signed)
[facility] DEPT Provider Note   CSN: [id number] Arrival date & time: 04/27/17  1211     History   Chief Complaint Chief Complaint  Patient presents with  . Facial Injury    HPI Derrick Rivera is a 83 y.o. male who presents with facial injury after a mechanical fall at 10 AM this morning. Patient states he was walking in his kitchen when he tripped over a cord causing him to fall against the refrigerator. He reports injury to his lip and left side of face. He denies any preceding chest pain or dizziness. He denies any loss of consciousness and is able to recall the entire event. He has not had any episodes of vomiting since the event. He reports that his dentition feels intact and denies any jaw pain. Patient takes baby aspirin daily. No other blood thinners.  The history is provided by the patient.    Past Medical History:  Diagnosis Date  . Allergic rhinitis   . Bilateral carotid artery stenosis    per last duplex [id number]  RICA 73-41%,  LICA 9-37%  . Bladder cancer [facility]) urologist-  dr Alyson Ingles   Non-invasise High Grade  . Chronic cough    ? lisinopril intolerence  . Coronary artery disease cardiologiat-  dr Shelva Majestic   hx PCI and stenting 2003/  per cardiac cath total occlusion RCA w/ collaterals 1995  . Diverticulosis of colon   . History of adenomatous polyp of colon    tubular adenoma's 1992; 1993; 1997; 2007  . History of colonic polyps   . History of nonmelanoma skin cancer    BCC and SCC right side nose  s/p moh's reseciton and reconstruction 2013/  left ear  01/ 2017/   06/ 2017 in office excision 2 areas on scalp  . History of TIAs takes ASA   pt states has a few what he thinks ia tia's, states last time with symptoms of veritgo and thought process was 04/ 2017  . HTN (hypertension)   . Hypercholesterolemia   . Non-Hodgkin lymphoma, unspecified, unspecified site [facility]) oncologist-  dr Benay Spice--  pt in clinical remission since 2000   dx 1996  no specific  therapy since 2000  . OA (osteoarthritis)   . OSA (obstructive sleep apnea)    moderate per study 08-10-2013--  intolerant cpap  . Prostate nodule   . Thrombocytopenia (HCC)    chronic mild  . Wears glasses     Patient Active Problem List   Diagnosis Date Noted  . Prostate nodule 08/10/2015  . Obstructive sleep apnea hypopnea, moderate 09/16/2014  . Hyperlipidemia LDL goal <70 09/16/2014  . Basal cell carcinoma 07/31/2014  . Laceration 06/13/2014  . Herpes zoster 03/30/2012  . NON-HODGKIN'S LYMPHOMA 07/09/2008  . THROMBOCYTOPENIA 07/09/2008  . Allergic rhinitis 06/27/2008  . COLONIC POLYPS 06/26/2008  . Essential hypertension 06/26/2008  . Coronary artery disease 06/26/2008  . Chronic venous insufficiency 06/26/2008  . GERD 06/26/2008  . DEGENERATIVE JOINT DISEASE 06/26/2008    Past Surgical History:  Procedure Laterality Date  . CARDIOVASCULAR STRESS TEST  07-25-2016 dr Shelva Majestic   Intermediate risk  nuclear study w/ a medium defect of moderate severity present in the basal inferolateral, mid inferlateral , and apical inferior location-- Findings consistent w/ ischemia and prior MI w/ per-infarct ischemia-- Per Dr [name] mild inferior ischemia; similar defect in 2014 study but less scar on present study:  Nuclear stress EF 52%  . CATARACT EXTRACTION W/ INTRAOCULAR LENS  IMPLANT, BILATERAL  2013  . CORONARY ANGIOPLASTY  1995   total occlusion RCA w/ collaterals;  PTCA 2nd marginal LCFX  . CORONARY ANGIOPLASTY WITH STENT PLACEMENT  01-05-2002  dr Shelva Majestic   Complex high-speed rotational atherectomy of entire LAD multiple sites and stenting proximal and mid LAD/  no re-stenosis in prior CFX marginal dilatation site and RCA occluded w/ left to right collaterals,  ef 57%  . CYSTOSCOPY W/ RETROGRADES Bilateral 12/22/2016   Procedure: CYSTOSCOPY WITH RETROGRADE PYELOGRAM;  Surgeon: Cleon Gustin, MD;  Location: [facility];  Service: Urology;  Laterality:  Bilateral;  . CYSTOSCOPY/RETROGRADE/URETEROSCOPY/STONE EXTRACTION WITH BASKET Bilateral 05/05/2016   Procedure: CYSTOSCOPY WITH RETROGRADE PYELOGRAM;  Surgeon: Cleon Gustin, MD;  Location: [facility];  Service: Urology;  Laterality: Bilateral;  . MOHS SURGERY  2013   at [facility]   nose -- w/ Reconstructive surgery same year  . MOHS SURGERY  01/ 2017   left ear  . REFRACTIVE SURGERY Left 05/ 2017  . TONSILLECTOMY  age 5  . TOTAL KNEE ARTHROPLASTY Left 03-07-2002  . TOTAL SHOULDER ARTHROPLASTY Left 04-30-2005  . TRANSTHORACIC ECHOCARDIOGRAM  04-29-2012   borderline inferior wall hypokinesis, ef 65%/  mild dilated RV/  mild to moderate AV calcification without stenosis/  mild AR, TR, and  MR/  moderate PR/  mild aortic root dilatation  . TRANSURETHRAL RESECTION OF BLADDER TUMOR N/A 12/22/2016   Procedure: TRANSURETHRAL RESECTION OF BLADDER TUMOR (TURBT);  Surgeon: Cleon Gustin, MD;  Location: [facility];  Service: Urology;  Laterality: N/A;  . TRANSURETHRAL RESECTION OF BLADDER TUMOR WITH GYRUS (TURBT-GYRUS) N/A 05/05/2016   Procedure: TRANSURETHRAL RESECTION OF BLADDER TUMOR WITH GYRUS (TURBT-GYRUS);  Surgeon: Cleon Gustin, MD;  Location: [facility];  Service: Urology;  Laterality: N/A;  . TRANSURETHRAL RESECTION OF BLADDER TUMOR WITH GYRUS (TURBT-GYRUS) N/A 06/09/2016   Procedure: TRANSURETHRAL RESECTION OF BLADDER TUMOR WITH GYRUS (TURBT-GYRUS);  Surgeon: Cleon Gustin, MD;  Location: [facility];  Service: Urology;  Laterality: N/A;       Home Medications    Prior to Admission medications   Medication Sig Start Date End Date Taking? Authorizing Provider  Ascorbic Acid (VITAMIN C) 500 MG tablet Take 500 mg by mouth every morning.     [provider]  aspirin EC 81 MG tablet Take 81 mg by mouth every evening.    [provider]  atenolol (TENORMIN) 50 MG tablet TAKE 1/2 TABLET (25 MG TOTAL) BY MOUTH 2 (TWO) TIMES DAILY. 12/25/16    Troy Sine, MD  atorvastatin (LIPITOR) 20 MG tablet TAKE 1 TABLET (20 MG TOTAL) BY MOUTH DAILY. 04/02/17   Troy Sine, MD  Azelastine-Fluticasone 137-50 MCG/ACT SUSP Place 1 spray into the nose daily as needed. 01/23/17   [name], MD  B Complex-C-Calcium (B-COMPLEX/VITAMIN C) TABS Take one tablet by mouth every other day  In AM    [provider]  chlorhexidine (PERIDEX) 0.12 % solution Use as directed 15 mLs in the mouth or throat 2 (two) times daily. 04/27/17   Volanda Napoleon, PA-C  clindamycin (CLEOCIN) 300 MG capsule Take 1 capsule (300 mg total) by mouth 3 (three) times daily. 04/27/17   Volanda Napoleon, PA-C  fish oil-omega-3 fatty acids 1000 MG capsule Take one capsule by mouth daily    [provider]  hydrochlorothiazide (MICROZIDE) 12.5 MG capsule TAKE ONE CAPSULE BY MOUTH DAILY 04/02/17   Troy Sine, MD  isosorbide mononitrate (  IMDUR) 60 MG 24 hr tablet TAKE ONE AND ONE-HALF (1  1/2) TABLETS BY MOUTH DAILY AROUND NOON 04/24/17   Isaiah Serge, NP  Multiple Vitamins-Minerals (MULTIVITAMIN WITH MINERALS) tablet Take one tablet by mouth every other day  In AM    [provider]  predniSONE (DELTASONE) 20 MG tablet Take 1 tablet by mouth daily for 5 days Patient not taking: Reported on 04/24/2017 03/09/17   [name], MD  verapamil (CALAN-SR) 180 MG CR tablet Take 1 tablet (180 mg total) by mouth at bedtime. 02/16/17   Troy Sine, MD    Family History Family History  Problem Relation Age of Onset  . Pancreatic cancer Mother   . Heart disease Father     Social History Social History  Substance Use Topics  . Smoking status: Former Smoker    Packs/day: 2.00    Years: 20.00    Types: Cigarettes    Quit date: 12/01/1966  . Smokeless tobacco: Never Used  . Alcohol use No     Allergies   Penicillins   Review of Systems Review of Systems  Skin: Positive for wound.     Physical Exam Updated Vital Signs BP (!)  152/87 (BP Location: Left Arm)   Pulse 65   Temp 97.9 F (36.6 C) (Oral)   Resp 16   Ht 5\' 10"  (1.778 m)   Wt 81.6 kg (180 lb)   SpO2 96%   BMI 25.83 kg/m   Physical Exam  Constitutional: He appears well-developed and well-nourished.  HENT:  Head: Normocephalic. Head is with abrasion and with laceration.  Left lower lip is swollen and has surrounding dried blood. Small laceration noted to the inside of lower lip that does not appear to go through and through. Small puncture wound noted to the anterior left chin just below the left lip. Multiple superficial abrasions to the left side of the face. Dried blood surrounding the left ear. Small 2.5 cm linear laceration to the anterior left ear.  Eyes: Conjunctivae and EOM are normal. Pupils are equal, round, and reactive to light. Right eye exhibits no discharge. Left eye exhibits no discharge. No scleral icterus.  No surrounding periorbital ecchymosis or edema bilaterally. No tenderness palpation to periorbital's bilaterally. EOMs intact without difficulty.  Neck: Full passive range of motion without pain.  Flexion/extension and lateral movement of neck intact without difficulty.  Cardiovascular: Normal rate and regular rhythm.   Pulmonary/Chest: Effort normal.  Musculoskeletal: Normal range of motion.  Neurological: He is alert.  Skin: Skin is warm and dry. Capillary refill takes less than 2 seconds. Abrasion noted.  Psychiatric: He has a normal mood and affect. His speech is normal and behavior is normal.  Nursing note and vitals reviewed.    ED Treatments / Results  Labs (all labs ordered are listed, but only abnormal results are displayed) Labs Reviewed - No data to display  EKG  EKG Interpretation None       Radiology Ct Head Wo Contrast  Result Date: 04/27/2017 CLINICAL DATA:  Fall, facial contact with refrigerator, lacerations to LEFT ear, LEFT lip, and LEFT chin, no loss of consciousness, history hypertension, bladder  cancer, non-Hodgkin's lymphoma, TIA, coronary artery disease, former smoker EXAM: CT HEAD WITHOUT CONTRAST CT MAXILLOFACIAL WITHOUT CONTRAST TECHNIQUE: Multidetector CT imaging of the head and maxillofacial structures were performed using the standard protocol without intravenous contrast. Multiplanar CT image reconstructions of the maxillofacial structures were also generated. COMPARISON:  CT head 05/31/2014 FINDINGS: CT  HEAD FINDINGS Brain: Generalized atrophy. Normal ventricular morphology. No midline shift or mass effect. Small vessel chronic ischemic changes of deep cerebral white matter. No intracranial hemorrhage, mass lesion, or evidence of acute infarction. No extra-axial fluid collections. Vascular: Atherosclerotic calcification of internal carotid and vertebral arteries at skullbase Skull: Calvaria intact Other: N/A CT MAXILLOFACIAL FINDINGS Osseous: Nasal septal deviation to the LEFT. Scattered beam hardening artifacts of dental origin. TMJ alignment normal bilaterally. No facial bone fractures identified. Degenerative disc and facet disease changes at the visualized cervical spine. Orbits: Intraorbital soft tissue planes clear.  Bony orbits intact. Sinuses: Paranasal sinuses, visualized mastoid air cells, and middle ear cavities clear. Soft tissues: Atherosclerotic calcifications at the carotid siphons. Visualized prevertebral soft tissues normal thickness. IMPRESSION: Atrophy with small vessel chronic ischemic changes of deep cerebral white matter. No acute intracranial abnormalities. No acute facial bone abnormalities. Electronically Signed   By: Lavonia Dana M.D.   On: 04/27/2017 14:29   Ct Maxillofacial Wo Contrast  Result Date: 04/27/2017 CLINICAL DATA:  Fall, facial contact with refrigerator, lacerations to LEFT ear, LEFT lip, and LEFT chin, no loss of consciousness, history hypertension, bladder cancer, non-Hodgkin's lymphoma, TIA, coronary artery disease, former smoker EXAM: CT HEAD WITHOUT  CONTRAST CT MAXILLOFACIAL WITHOUT CONTRAST TECHNIQUE: Multidetector CT imaging of the head and maxillofacial structures were performed using the standard protocol without intravenous contrast. Multiplanar CT image reconstructions of the maxillofacial structures were also generated. COMPARISON:  CT head 05/31/2014 FINDINGS: CT HEAD FINDINGS Brain: Generalized atrophy. Normal ventricular morphology. No midline shift or mass effect. Small vessel chronic ischemic changes of deep cerebral white matter. No intracranial hemorrhage, mass lesion, or evidence of acute infarction. No extra-axial fluid collections. Vascular: Atherosclerotic calcification of internal carotid and vertebral arteries at skullbase Skull: Calvaria intact Other: N/A CT MAXILLOFACIAL FINDINGS Osseous: Nasal septal deviation to the LEFT. Scattered beam hardening artifacts of dental origin. TMJ alignment normal bilaterally. No facial bone fractures identified. Degenerative disc and facet disease changes at the visualized cervical spine. Orbits: Intraorbital soft tissue planes clear.  Bony orbits intact. Sinuses: Paranasal sinuses, visualized mastoid air cells, and middle ear cavities clear. Soft tissues: Atherosclerotic calcifications at the carotid siphons. Visualized prevertebral soft tissues normal thickness. IMPRESSION: Atrophy with small vessel chronic ischemic changes of deep cerebral white matter. No acute intracranial abnormalities. No acute facial bone abnormalities. Electronically Signed   By: Lavonia Dana M.D.   On: 04/27/2017 14:29    Procedures .Marland KitchenLaceration Repair Date/Time: 04/28/2017 1:55 AM Performed by: Providence Lanius A Authorized by: Providence Lanius A   Consent:    Consent obtained:  Verbal   Consent given by:  Patient   Risks discussed:  Infection, poor cosmetic result and poor wound healing Anesthesia (see MAR for exact dosages):    Anesthesia method:  Local infiltration   Local anesthetic:  Lidocaine 1% w/o  epi Laceration details:    Location:  Ear   Ear location:  L ear   Length (cm):  2.5 Repair type:    Repair type:  Simple Pre-procedure details:    Preparation:  Patient was prepped and draped in usual sterile fashion Exploration:    Hemostasis achieved with:  Direct pressure   Wound exploration: entire depth of wound probed and visualized   Treatment:    Area cleansed with:  Betadine   Amount of cleaning:  Extensive   Irrigation solution:  Sterile saline   Irrigation method:  Syringe Skin repair:    Repair method:  Sutures   Suture  size:  5-0   Suture material:  Nylon   Number of sutures:  5 Approximation:    Approximation:  Close   Vermilion border: well-aligned   Post-procedure details:    Dressing:  Sterile dressing   Patient tolerance of procedure:  Tolerated well, no immediate complications   (including critical care time)  Medications Ordered in ED Medications  lidocaine (PF) (XYLOCAINE) 1 % injection 10 mL (10 mLs Intradermal Given 04/27/17 1906)  Tdap (BOOSTRIX) injection 0.5 mL (0.5 mLs Intramuscular Given 04/27/17 1812)  bacitracin 500 UNIT/GM ointment (1 application  Given 3/70/96 1908)  acetaminophen (TYLENOL) tablet 650 mg (650 mg Oral Given 04/27/17 1952)     Initial Impression / Assessment and Plan / ED Course  I have reviewed the triage vital signs and the nursing notes.  Pertinent labs & imaging results that were available during my care of the patient were reviewed by me and considered in my medical decision making (see chart for details).     83 year old male who takes daily baby aspirin presents with a facial injury that occurred at 10 AM this morning. Fall sounds mechanical in nature. Concern for laceration to the lip and left ear. Presence of dried blood to limits physical exam. CT head and maxillofacial ordered in triage. Wound care provided in the department. Patient's tetanus Was in 2012. Will plan to update in the department.  CT maxillofacial  and head reviewed. No acute fracture or intracranial pathology.  Wounds were cleaned and explored thoroughly. The anterior lip lacerations appear to go through and through. He has a small puncture wound to the left side of his chin just below the lip. It does not appear to go through and through. He has a small 2.5 cm linear laceration just anterior to the left ear that needs repair.  Laceration repaired as documented above. Antibacterial ointment applied. Will apply pressure dressing given small hematoma on right ear. We will plan to provide patient with Peridex for interior lip laceration. Will also plan to provide antibiotic coverage given the multiple abrasions and small puncture wound on his face. Wound care instructions given to patient. Patient instructed her return to the department for suture removal. Strict return precautions discussed. Patient expressed understanding and agreement to plan.  Final Clinical Impressions(s) / ED Diagnoses   Final diagnoses:  Contusion of face, initial encounter  Multiple abrasions  Lip laceration, initial encounter  Facial laceration, initial encounter    New Prescriptions Discharge Medication List as of 04/27/2017  7:12 PM    START taking these medications   Details  chlorhexidine (PERIDEX) 0.12 % solution Use as directed 15 mLs in the mouth or throat 2 (two) times daily., Starting Mon 04/27/2017, Print    clindamycin (CLEOCIN) 300 MG capsule Take 1 capsule (300 mg total) by mouth 3 (three) times daily., Starting Mon 04/27/2017, Print         Volanda Napoleon, PA-C 04/28/17 0200    Charlesetta Shanks, MD 05/01/17 1440

## 2017-05-01 DIAGNOSIS — Z961 Presence of intraocular lens: Secondary | ICD-10-CM | POA: Diagnosis not present

## 2017-05-01 DIAGNOSIS — H353111 Nonexudative age-related macular degeneration, right eye, early dry stage: Secondary | ICD-10-CM | POA: Diagnosis not present

## 2017-05-01 DIAGNOSIS — H524 Presbyopia: Secondary | ICD-10-CM | POA: Diagnosis not present

## 2017-05-01 DIAGNOSIS — H4720 Unspecified optic atrophy: Secondary | ICD-10-CM | POA: Diagnosis not present

## 2017-05-04 ENCOUNTER — Ambulatory Visit
Admission: RE | Admit: 2017-05-04 | Discharge: 2017-05-04 | Disposition: A | Discharge status: Home / Self Care | Payer: Medicare HMO | Source: Ambulatory Visit | Attending: Orthopedic Surgery | Admitting: Orthopedic Surgery

## 2017-05-04 DIAGNOSIS — M5442 Lumbago with sciatica, left side: Principal | ICD-10-CM

## 2017-05-04 DIAGNOSIS — M5441 Lumbago with sciatica, right side: Principal | ICD-10-CM

## 2017-05-04 DIAGNOSIS — M48061 Spinal stenosis, lumbar region without neurogenic claudication: Secondary | ICD-10-CM | POA: Diagnosis not present

## 2017-05-04 DIAGNOSIS — G8929 Other chronic pain: Secondary | ICD-10-CM

## 2017-05-04 MED ORDER — IOPAMIDOL (ISOVUE-M 200) INJECTION 41%
15.0000 mL | Freq: Once | INTRAMUSCULAR | Status: AC
  Administered 2017-05-04: 15 mL via INTRATHECAL

## 2017-05-04 NOTE — Discharge Instructions (Signed)

## 2017-05-06 ENCOUNTER — Emergency Department (HOSPITAL_COMMUNITY)
Admission: EM | Admit: 2017-05-06 | Discharge: 2017-05-06 | Disposition: A | Discharge status: Home / Self Care | Payer: Medicare HMO | Attending: Emergency Medicine | Admitting: Emergency Medicine

## 2017-05-06 DIAGNOSIS — Z7982 Long term (current) use of aspirin: Secondary | ICD-10-CM | POA: Diagnosis not present

## 2017-05-06 DIAGNOSIS — I2510 Atherosclerotic heart disease of native coronary artery without angina pectoris: Secondary | ICD-10-CM | POA: Insufficient documentation

## 2017-05-06 DIAGNOSIS — I10 Essential (primary) hypertension: Secondary | ICD-10-CM | POA: Diagnosis not present

## 2017-05-06 DIAGNOSIS — Z4802 Encounter for removal of sutures: Secondary | ICD-10-CM | POA: Diagnosis not present

## 2017-05-06 DIAGNOSIS — S0181XD Laceration without foreign body of other part of head, subsequent encounter: Secondary | ICD-10-CM | POA: Diagnosis not present

## 2017-05-06 DIAGNOSIS — Z87891 Personal history of nicotine dependence: Secondary | ICD-10-CM | POA: Diagnosis not present

## 2017-05-06 DIAGNOSIS — Z96652 Presence of left artificial knee joint: Secondary | ICD-10-CM | POA: Insufficient documentation

## 2017-05-06 DIAGNOSIS — W0110XA Fall on same level from slipping, tripping and stumbling with subsequent striking against unspecified object, initial encounter: Secondary | ICD-10-CM | POA: Insufficient documentation

## 2017-05-06 DIAGNOSIS — Z96612 Presence of left artificial shoulder joint: Secondary | ICD-10-CM | POA: Diagnosis not present

## 2017-05-06 DIAGNOSIS — Z79899 Other long term (current) drug therapy: Secondary | ICD-10-CM | POA: Insufficient documentation

## 2017-05-06 DIAGNOSIS — Z951 Presence of aortocoronary bypass graft: Secondary | ICD-10-CM | POA: Diagnosis not present

## 2017-05-06 NOTE — ED Notes (Signed)
Sutures removed from L ear. No bleeding or drainage at site, skin intact. Pt d/c.

## 2017-05-06 NOTE — ED Provider Notes (Signed)
[facility] DEPT Provider Note   CSN: [id number] Arrival date & time: 05/06/17  0804     History   Chief Complaint Chief Complaint  Patient presents with  . Suture / Staple Removal    HPI Derrick Rivera is a 83 y.o. male.  The history is provided by the patient. No language interpreter was used.  Suture / Staple Removal  This is a new problem. The problem occurs constantly. The problem has not changed since onset.Nothing aggravates the symptoms. Nothing relieves the symptoms. He has tried nothing for the symptoms.  Pt here for suture removal.  No complaints.  Past Medical History:  Diagnosis Date  . Allergic rhinitis   . Bilateral carotid artery stenosis    per last duplex 30-16-0109  RICA 32-35%,  LICA 5-73%  . Bladder cancer [facility]) urologist-  dr Alyson Ingles   Non-invasise High Grade  . Chronic cough    ? lisinopril intolerence  . Coronary artery disease cardiologiat-  dr Shelva Majestic   hx PCI and stenting 2003/  per cardiac cath total occlusion RCA w/ collaterals 1995  . Diverticulosis of colon   . History of adenomatous polyp of colon    tubular adenoma's 1992; 1993; 1997; 2007  . History of colonic polyps   . History of nonmelanoma skin cancer    BCC and SCC right side nose  s/p moh's reseciton and reconstruction 2013/  left ear  01/ 2017/   06/ 2017 in office excision 2 areas on scalp  . History of TIAs takes ASA   pt states has a few what he thinks ia tia's, states last time with symptoms of veritgo and thought process was 04/ 2017  . HTN (hypertension)   . Hypercholesterolemia   . Non-Hodgkin lymphoma, unspecified, unspecified site [facility] oncologist-  dr Benay Spice--  pt in clinical remission since 2000   dx 1996  no specific therapy since 2000  . OA (osteoarthritis)   . OSA (obstructive sleep apnea)    moderate per study 08-10-2013--  intolerant cpap  . Prostate nodule   . Thrombocytopenia (HCC)    chronic mild  . Wears glasses     Patient Active Problem List     Diagnosis Date Noted  . Prostate nodule 08/10/2015  . Obstructive sleep apnea hypopnea, moderate 09/16/2014  . Hyperlipidemia LDL goal <70 09/16/2014  . Basal cell carcinoma 07/31/2014  . Laceration 06/13/2014  . Herpes zoster 03/30/2012  . NON-HODGKIN'S LYMPHOMA 07/09/2008  . THROMBOCYTOPENIA 07/09/2008  . Allergic rhinitis 06/27/2008  . COLONIC POLYPS 06/26/2008  . Essential hypertension 06/26/2008  . Coronary artery disease 06/26/2008  . Chronic venous insufficiency 06/26/2008  . GERD 06/26/2008  . DEGENERATIVE JOINT DISEASE 06/26/2008    Past Surgical History:  Procedure Laterality Date  . CARDIOVASCULAR STRESS TEST  07-25-2016 dr Shelva Majestic   Intermediate risk  nuclear study w/ a medium defect of moderate severity present in the basal inferolateral, mid inferlateral , and apical inferior location-- Findings consistent w/ ischemia and prior MI w/ per-infarct ischemia-- Per Dr [name] mild inferior ischemia; similar defect in 2014 study but less scar on present study:  Nuclear stress EF 52%  . CATARACT EXTRACTION W/ INTRAOCULAR LENS  IMPLANT, BILATERAL  2013  . CORONARY ANGIOPLASTY  1995   total occlusion RCA w/ collaterals;  PTCA 2nd marginal LCFX  . CORONARY ANGIOPLASTY WITH STENT PLACEMENT  01-05-2002  dr Shelva Majestic   Complex high-speed rotational atherectomy of entire LAD multiple sites and stenting proximal and mid LAD/  no re-stenosis in prior CFX marginal dilatation site and RCA occluded w/ left to right collaterals,  ef 57%  . CYSTOSCOPY W/ RETROGRADES Bilateral 12/22/2016   Procedure: CYSTOSCOPY WITH RETROGRADE PYELOGRAM;  Surgeon: Cleon Gustin, MD;  Location: [facility];  Service: Urology;  Laterality: Bilateral;  . CYSTOSCOPY/RETROGRADE/URETEROSCOPY/STONE EXTRACTION WITH BASKET Bilateral 05/05/2016   Procedure: CYSTOSCOPY WITH RETROGRADE PYELOGRAM;  Surgeon: Cleon Gustin, MD;  Location: [facility];  Service: Urology;  Laterality: Bilateral;  .  MOHS SURGERY  2013   at [facility]   nose -- w/ Reconstructive surgery same year  . MOHS SURGERY  01/ 2017   left ear  . REFRACTIVE SURGERY Left 05/ 2017  . TONSILLECTOMY  age 61  . TOTAL KNEE ARTHROPLASTY Left 03-07-2002  . TOTAL SHOULDER ARTHROPLASTY Left 04-30-2005  . TRANSTHORACIC ECHOCARDIOGRAM  04-29-2012   borderline inferior wall hypokinesis, ef 65%/  mild dilated RV/  mild to moderate AV calcification without stenosis/  mild AR, TR, and  MR/  moderate PR/  mild aortic root dilatation  . TRANSURETHRAL RESECTION OF BLADDER TUMOR N/A 12/22/2016   Procedure: TRANSURETHRAL RESECTION OF BLADDER TUMOR (TURBT);  Surgeon: Cleon Gustin, MD;  Location: [facility];  Service: Urology;  Laterality: N/A;  . TRANSURETHRAL RESECTION OF BLADDER TUMOR WITH GYRUS (TURBT-GYRUS) N/A 05/05/2016   Procedure: TRANSURETHRAL RESECTION OF BLADDER TUMOR WITH GYRUS (TURBT-GYRUS);  Surgeon: Cleon Gustin, MD;  Location: [facility];  Service: Urology;  Laterality: N/A;  . TRANSURETHRAL RESECTION OF BLADDER TUMOR WITH GYRUS (TURBT-GYRUS) N/A 06/09/2016   Procedure: TRANSURETHRAL RESECTION OF BLADDER TUMOR WITH GYRUS (TURBT-GYRUS);  Surgeon: Cleon Gustin, MD;  Location: [facility];  Service: Urology;  Laterality: N/A;       Home Medications    Prior to Admission medications   Medication Sig Start Date End Date Taking? Authorizing Provider  Ascorbic Acid (VITAMIN C) 500 MG tablet Take 500 mg by mouth every morning.     [provider]  aspirin EC 81 MG tablet Take 81 mg by mouth every evening.    [provider]  atenolol (TENORMIN) 50 MG tablet TAKE 1/2 TABLET (25 MG TOTAL) BY MOUTH 2 (TWO) TIMES DAILY. 12/25/16   Troy Sine, MD  atorvastatin (LIPITOR) 20 MG tablet TAKE 1 TABLET (20 MG TOTAL) BY MOUTH DAILY. 04/02/17   Troy Sine, MD  Azelastine-Fluticasone 137-50 MCG/ACT SUSP Place 1 spray into the nose daily as needed. 01/23/17   [name],  MD  B Complex-C-Calcium (B-COMPLEX/VITAMIN C) TABS Take one tablet by mouth every other day  In AM    [provider]  chlorhexidine (PERIDEX) 0.12 % solution Use as directed 15 mLs in the mouth or throat 2 (two) times daily. 04/27/17   Volanda Napoleon, PA-C  clindamycin (CLEOCIN) 300 MG capsule Take 1 capsule (300 mg total) by mouth 3 (three) times daily. 04/27/17   Volanda Napoleon, PA-C  fish oil-omega-3 fatty acids 1000 MG capsule Take one capsule by mouth daily    [provider]  hydrochlorothiazide (MICROZIDE) 12.5 MG capsule TAKE ONE CAPSULE BY MOUTH DAILY 04/02/17   Troy Sine, MD  isosorbide mononitrate (IMDUR) 60 MG 24 hr tablet TAKE ONE AND ONE-HALF (1  1/2) TABLETS BY MOUTH DAILY AROUND NOON 04/24/17   Isaiah Serge, NP  Multiple Vitamins-Minerals (MULTIVITAMIN WITH MINERALS) tablet Take one tablet by mouth every other day  In AM    [provider]  predniSONE (DELTASONE) 20 MG tablet Take 1 tablet by mouth daily for 5 days Patient not taking: Reported on 04/24/2017 03/09/17   [name], MD  verapamil (CALAN-SR) 180 MG CR tablet Take 1 tablet (180 mg total) by mouth at bedtime. 02/16/17   Troy Sine, MD    Family History Family History  Problem Relation Age of Onset  . Pancreatic cancer Mother   . Heart disease Father     Social History Social History  Substance Use Topics  . Smoking status: Former Smoker    Packs/day: 2.00    Years: 20.00    Types: Cigarettes    Quit date: 12/01/1966  . Smokeless tobacco: Never Used  . Alcohol use No     Allergies   Penicillins   Review of Systems Review of Systems  All other systems reviewed and are negative.    Physical Exam Updated Vital Signs BP 136/78 (BP Location: Right Arm)   Pulse 73   Temp 98.2 F (36.8 C) (Oral)   Resp 16   SpO2 97%   Physical Exam  Constitutional: He appears well-developed and well-nourished.  HENT:  Head: Normocephalic.  Neurological: He is alert.   Skin: Skin is warm.  Healed laceration face   Psychiatric: He has a normal mood and affect.  Nursing note and vitals reviewed.    ED Treatments / Results  Labs (all labs ordered are listed, but only abnormal results are displayed) Labs Reviewed - No data to display  EKG  EKG Interpretation None       Radiology Ct Lumbar Spine W Contrast  Result Date: 05/04/2017 CLINICAL DATA:  Low back pain and BILATERAL leg pain. Symptoms are exacerbated by standing. Intermittent claudication, symptoms for 3 months, but worse recently. EXAM: LUMBAR MYELOGRAM FLUOROSCOPY TIME:  2 minutes and 7 seconds corresponding to a Dose Area Product of 477.47?microGy*m2 PROCEDURE: After thorough discussion of risks and benefits of the procedure including bleeding, infection, injury to nerves, blood vessels, adjacent structures as well as headache and CSF leak, written and oral informed consent was obtained. Consent was obtained by Dr. Rolla Flatten. Time out form was completed. Patient was positioned prone on the fluoroscopy table. Local anesthesia was provided with 1% lidocaine without epinephrine after prepped and draped in the usual sterile fashion. There were initial unsuccessful attempts at L2-3 and L3-4 on the RIGHT. Successful lumbar puncture was performed at L5-S1 using a 3 1/2 inch 22-gauge spinal needle via LEFT paramedian approach. Using a single pass through the dura, the needle was placed within the thecal sac, with return of clear CSF. 15 mL of Isovue-M 200 was injected into the thecal sac, with normal opacification of the nerve roots and cauda equina consistent with free flow within the subarachnoid space. I personally performed the lumbar puncture and administered the intrathecal contrast. I also personally supervised acquisition of the myelogram images. TECHNIQUE: Contiguous axial images were obtained through the Lumbar spine after the intrathecal infusion of infusion. Coronal and sagittal reconstructions  were obtained of the axial image sets. COMPARISON:  None FINDINGS: LUMBAR MYELOGRAM FINDINGS: Good opacification lumbar subarachnoid space. There is mild degenerative scoliosis convex RIGHT mid lumbar region, related to asymmetric loss of interspace height at L2-3 and L3-4 on the LEFT. There is severe stenosis at L4-5, moderate stenosis at L3-4, and mild stenosis at L5-S1 and L2-3. LEFT greater than RIGHT nerve root impingement is seen at L2-3 and L3-4. BILATERAL RIGHT greater than LEFT nerve root impingement is seen at  L4-5. BILATERAL nerve root effacement is seen at L5-S1, LEFT greater than RIGHT where there is also advanced disc space narrowing. There is severe disc space narrowing also at L3-4. With patient prone for myelography, anterolisthesis at L4-5 measures 4 mm. With the patient standing in neutral, the anterolisthesis increases to 6 mm. In upright flexion, the anterolisthesis is unchanged, measuring 6 mm. In upright extension, the anterolisthesis measures 5 mm. CT LUMBAR MYELOGRAM FINDINGS: Segmentation: Normal. Alignment:  4 mm anterolisthesis with patient recumbent for CT. Vertebrae: No worrisome osseous lesion.Scattered Schmorl's nodes. Conus medullaris: Normal in size and location. Paraspinal tissues: No evidence for hydronephrosis or paravertebral mass. Advanced aortic atherosclerosis. Disc levels: T11-T12: Disc space narrowing. Shallow central protrusion. Posterior element hypertrophy. Mild canal stenosis without conus compression. T12-L1:  Unremarkable. L1-L2: Annular bulge. Posterior element hypertrophy. BILATERAL foraminal narrowing could affect either L1 nerve root but there is no subarticular narrowing or significant stenosis. L2-L3: Asymmetric loss of interspace height on the LEFT. Osseous spurring. Annular bulge. Posterior element hypertrophy. Moderate stenosis. LEFT greater than RIGHT L2 and L3 nerve root impingement. L3-L4: Moderate to severe disc space narrowing, worse on the LEFT. Central  protrusion. Posterior element hypertrophy. Moderate stenosis. LEFT greater than RIGHT L3 and L4 nerve root impingement. L4-L5: 4 mm anterolisthesis. Uncovering of the disc with central protrusion. Asymmetric loss of interspace height on the RIGHT. Posterior element hypertrophy. Severe stenosis. RIGHT greater than LEFT L4 and L5 nerve root impingement. L5-S1: Advanced disc space narrowing. Central protrusion. Posterior element hypertrophy. LEFT greater than RIGHT S1 nerve root effacement. BILATERAL foraminal narrowing could affect either L5 nerve root. IMPRESSION: LUMBAR MYELOGRAM IMPRESSION: Dynamic instability, at L4-5, where degenerative spondylolisthesis increases from 4 to 6 mm in standing flexion. Severe stenosis at L4-5, with moderate stenosis at L3-4 and L2-3. Degenerative scoliosis convex RIGHT, related to asymmetric loss of interspace height at L2-3 and L3-4 on the LEFT. CT LUMBAR MYELOGRAM IMPRESSION: Severe stenosis at L4-5 is multifactorial, related to uncovering of the disc with central protrusion, posterior element hypertrophy, asymmetric RIGHT-sided with loss of interspace height, 4 mm slip, and osseous spurring. RIGHT greater than LEFT L4 and L5 nerve root impingement. Similar less severe changes, moderate multifactorial stenosis, at L2-3 and L3-4, worse on the LEFT due to loss of interspace height. Central and leftward protrusion L5-S1. Advanced disc space narrowing. BILATERAL foraminal narrowing and LEFT greater than RIGHT subarticular zone narrowing. Electronically Signed   By: Staci Righter M.D.   On: 05/04/2017 14:55   Dg Myelography Lumbar Inj Lumbosacral  Result Date: 05/04/2017 CLINICAL DATA:  Low back pain and BILATERAL leg pain. Symptoms are exacerbated by standing. Intermittent claudication, symptoms for 3 months, but worse recently. EXAM: LUMBAR MYELOGRAM FLUOROSCOPY TIME:  2 minutes and 7 seconds corresponding to a Dose Area Product of 477.47?microGy*m2 PROCEDURE: After thorough  discussion of risks and benefits of the procedure including bleeding, infection, injury to nerves, blood vessels, adjacent structures as well as headache and CSF leak, written and oral informed consent was obtained. Consent was obtained by Dr. Rolla Flatten. Time out form was completed. Patient was positioned prone on the fluoroscopy table. Local anesthesia was provided with 1% lidocaine without epinephrine after prepped and draped in the usual sterile fashion. There were initial unsuccessful attempts at L2-3 and L3-4 on the RIGHT. Successful lumbar puncture was performed at L5-S1 using a 3 1/2 inch 22-gauge spinal needle via LEFT paramedian approach. Using a single pass through the dura, the needle was placed within the thecal sac, with  return of clear CSF. 15 mL of Isovue-M 200 was injected into the thecal sac, with normal opacification of the nerve roots and cauda equina consistent with free flow within the subarachnoid space. I personally performed the lumbar puncture and administered the intrathecal contrast. I also personally supervised acquisition of the myelogram images. TECHNIQUE: Contiguous axial images were obtained through the Lumbar spine after the intrathecal infusion of infusion. Coronal and sagittal reconstructions were obtained of the axial image sets. COMPARISON:  None FINDINGS: LUMBAR MYELOGRAM FINDINGS: Good opacification lumbar subarachnoid space. There is mild degenerative scoliosis convex RIGHT mid lumbar region, related to asymmetric loss of interspace height at L2-3 and L3-4 on the LEFT. There is severe stenosis at L4-5, moderate stenosis at L3-4, and mild stenosis at L5-S1 and L2-3. LEFT greater than RIGHT nerve root impingement is seen at L2-3 and L3-4. BILATERAL RIGHT greater than LEFT nerve root impingement is seen at L4-5. BILATERAL nerve root effacement is seen at L5-S1, LEFT greater than RIGHT where there is also advanced disc space narrowing. There is severe disc space narrowing also  at L3-4. With patient prone for myelography, anterolisthesis at L4-5 measures 4 mm. With the patient standing in neutral, the anterolisthesis increases to 6 mm. In upright flexion, the anterolisthesis is unchanged, measuring 6 mm. In upright extension, the anterolisthesis measures 5 mm. CT LUMBAR MYELOGRAM FINDINGS: Segmentation: Normal. Alignment:  4 mm anterolisthesis with patient recumbent for CT. Vertebrae: No worrisome osseous lesion.Scattered Schmorl's nodes. Conus medullaris: Normal in size and location. Paraspinal tissues: No evidence for hydronephrosis or paravertebral mass. Advanced aortic atherosclerosis. Disc levels: T11-T12: Disc space narrowing. Shallow central protrusion. Posterior element hypertrophy. Mild canal stenosis without conus compression. T12-L1:  Unremarkable. L1-L2: Annular bulge. Posterior element hypertrophy. BILATERAL foraminal narrowing could affect either L1 nerve root but there is no subarticular narrowing or significant stenosis. L2-L3: Asymmetric loss of interspace height on the LEFT. Osseous spurring. Annular bulge. Posterior element hypertrophy. Moderate stenosis. LEFT greater than RIGHT L2 and L3 nerve root impingement. L3-L4: Moderate to severe disc space narrowing, worse on the LEFT. Central protrusion. Posterior element hypertrophy. Moderate stenosis. LEFT greater than RIGHT L3 and L4 nerve root impingement. L4-L5: 4 mm anterolisthesis. Uncovering of the disc with central protrusion. Asymmetric loss of interspace height on the RIGHT. Posterior element hypertrophy. Severe stenosis. RIGHT greater than LEFT L4 and L5 nerve root impingement. L5-S1: Advanced disc space narrowing. Central protrusion. Posterior element hypertrophy. LEFT greater than RIGHT S1 nerve root effacement. BILATERAL foraminal narrowing could affect either L5 nerve root. IMPRESSION: LUMBAR MYELOGRAM IMPRESSION: Dynamic instability, at L4-5, where degenerative spondylolisthesis increases from 4 to 6 mm in  standing flexion. Severe stenosis at L4-5, with moderate stenosis at L3-4 and L2-3. Degenerative scoliosis convex RIGHT, related to asymmetric loss of interspace height at L2-3 and L3-4 on the LEFT. CT LUMBAR MYELOGRAM IMPRESSION: Severe stenosis at L4-5 is multifactorial, related to uncovering of the disc with central protrusion, posterior element hypertrophy, asymmetric RIGHT-sided with loss of interspace height, 4 mm slip, and osseous spurring. RIGHT greater than LEFT L4 and L5 nerve root impingement. Similar less severe changes, moderate multifactorial stenosis, at L2-3 and L3-4, worse on the LEFT due to loss of interspace height. Central and leftward protrusion L5-S1. Advanced disc space narrowing. BILATERAL foraminal narrowing and LEFT greater than RIGHT subarticular zone narrowing. Electronically Signed   By: Staci Righter M.D.   On: 05/04/2017 14:55    Procedures Procedures (including critical care time)  Medications Ordered in ED Medications - No  data to display   Initial Impression / Assessment and Plan / ED Course  I have reviewed the triage vital signs and the nursing notes.  Pertinent labs & imaging results that were available during my care of the patient were reviewed by me and considered in my medical decision making (see chart for details).      Final Clinical Impressions(s) / ED Diagnoses   Final diagnoses:  Visit for suture removal    New Prescriptions New Prescriptions   No medications on file  An After Visit Summary was printed and given to the patient.    Fransico Meadow, [place] 05/06/17 [id number]    Dorie Rank, MD 05/06/17 [phone]

## 2017-05-06 NOTE — Discharge Instructions (Signed)
Return if any problems.

## 2017-05-06 NOTE — ED Triage Notes (Signed)
Pt returned for suture removal L ear.

## 2017-05-11 DIAGNOSIS — M4807 Spinal stenosis, lumbosacral region: Secondary | ICD-10-CM | POA: Diagnosis not present

## 2017-05-14 DIAGNOSIS — M4807 Spinal stenosis, lumbosacral region: Secondary | ICD-10-CM | POA: Diagnosis not present

## 2017-05-28 DIAGNOSIS — M4807 Spinal stenosis, lumbosacral region: Secondary | ICD-10-CM | POA: Diagnosis not present

## 2017-05-29 ENCOUNTER — Telehealth: Payer: Self-pay

## 2017-05-29 NOTE — Telephone Encounter (Signed)
Patient is on the list for [employer] [facility] and may be a good candidate for an AWV. Please let me know if/when appt is scheduled.   

## 2017-06-22 DIAGNOSIS — M21372 Foot drop, left foot: Secondary | ICD-10-CM | POA: Diagnosis not present

## 2017-06-22 DIAGNOSIS — M21371 Foot drop, right foot: Secondary | ICD-10-CM | POA: Diagnosis not present

## 2017-06-22 DIAGNOSIS — M4807 Spinal stenosis, lumbosacral region: Secondary | ICD-10-CM | POA: Diagnosis not present

## 2017-06-22 DIAGNOSIS — M5442 Lumbago with sciatica, left side: Secondary | ICD-10-CM | POA: Diagnosis not present

## 2017-06-25 DIAGNOSIS — K8020 Calculus of gallbladder without cholecystitis without obstruction: Secondary | ICD-10-CM | POA: Diagnosis not present

## 2017-06-25 DIAGNOSIS — C672 Malignant neoplasm of lateral wall of bladder: Secondary | ICD-10-CM | POA: Diagnosis not present

## 2017-06-26 ENCOUNTER — Other Ambulatory Visit: Payer: Self-pay | Admitting: Urology

## 2017-06-26 ENCOUNTER — Telehealth: Payer: Self-pay | Admitting: Cardiovascular Disease

## 2017-06-26 ENCOUNTER — Encounter (HOSPITAL_COMMUNITY): Payer: Self-pay | Admitting: *Deleted

## 2017-06-26 DIAGNOSIS — E785 Hyperlipidemia, unspecified: Secondary | ICD-10-CM

## 2017-06-26 DIAGNOSIS — I2510 Atherosclerotic heart disease of native coronary artery without angina pectoris: Secondary | ICD-10-CM

## 2017-06-26 DIAGNOSIS — I10 Essential (primary) hypertension: Secondary | ICD-10-CM

## 2017-06-26 NOTE — Telephone Encounter (Signed)
Lab slips mailed to patient, patient aware and verbalized understanding.

## 2017-06-26 NOTE — Telephone Encounter (Signed)
Pt would like a lab order mailed to him please. He wants to have his lab work before his appt with Dr [name] on 08-27-17.

## 2017-06-29 ENCOUNTER — Ambulatory Visit (HOSPITAL_COMMUNITY): Payer: Medicare HMO | Admitting: Anesthesiology

## 2017-06-29 ENCOUNTER — Ambulatory Visit (HOSPITAL_COMMUNITY): Payer: Medicare HMO

## 2017-06-29 ENCOUNTER — Observation Stay (HOSPITAL_COMMUNITY)
Admission: RE | Admit: 2017-06-29 | Discharge: 2017-06-29 | Disposition: A | Discharge status: Home / Self Care | Payer: Medicare HMO | Source: Ambulatory Visit | Attending: Urology | Admitting: Urology

## 2017-06-29 ENCOUNTER — Encounter (HOSPITAL_COMMUNITY): Payer: Self-pay | Admitting: *Deleted

## 2017-06-29 ENCOUNTER — Encounter (HOSPITAL_COMMUNITY)
Admission: RE | Disposition: A | Discharge status: Home / Self Care | Payer: Self-pay | Source: Ambulatory Visit | Attending: Urology

## 2017-06-29 DIAGNOSIS — C672 Malignant neoplasm of lateral wall of bladder: Principal | ICD-10-CM | POA: Insufficient documentation

## 2017-06-29 DIAGNOSIS — Z87891 Personal history of nicotine dependence: Secondary | ICD-10-CM | POA: Diagnosis not present

## 2017-06-29 DIAGNOSIS — D494 Neoplasm of unspecified behavior of bladder: Secondary | ICD-10-CM | POA: Diagnosis not present

## 2017-06-29 DIAGNOSIS — Z96652 Presence of left artificial knee joint: Secondary | ICD-10-CM | POA: Diagnosis not present

## 2017-06-29 DIAGNOSIS — Z79899 Other long term (current) drug therapy: Secondary | ICD-10-CM | POA: Insufficient documentation

## 2017-06-29 DIAGNOSIS — I739 Peripheral vascular disease, unspecified: Secondary | ICD-10-CM | POA: Insufficient documentation

## 2017-06-29 DIAGNOSIS — E7800 Pure hypercholesterolemia, unspecified: Secondary | ICD-10-CM | POA: Insufficient documentation

## 2017-06-29 DIAGNOSIS — Z7982 Long term (current) use of aspirin: Secondary | ICD-10-CM | POA: Insufficient documentation

## 2017-06-29 DIAGNOSIS — C679 Malignant neoplasm of bladder, unspecified: Secondary | ICD-10-CM | POA: Diagnosis not present

## 2017-06-29 DIAGNOSIS — Z85828 Personal history of other malignant neoplasm of skin: Secondary | ICD-10-CM | POA: Insufficient documentation

## 2017-06-29 DIAGNOSIS — C670 Malignant neoplasm of trigone of bladder: Secondary | ICD-10-CM | POA: Insufficient documentation

## 2017-06-29 DIAGNOSIS — Z96612 Presence of left artificial shoulder joint: Secondary | ICD-10-CM | POA: Diagnosis not present

## 2017-06-29 DIAGNOSIS — C8590 Non-Hodgkin lymphoma, unspecified, unspecified site: Secondary | ICD-10-CM | POA: Insufficient documentation

## 2017-06-29 DIAGNOSIS — I10 Essential (primary) hypertension: Secondary | ICD-10-CM | POA: Insufficient documentation

## 2017-06-29 DIAGNOSIS — Z8673 Personal history of transient ischemic attack (TIA), and cerebral infarction without residual deficits: Secondary | ICD-10-CM | POA: Insufficient documentation

## 2017-06-29 DIAGNOSIS — G4733 Obstructive sleep apnea (adult) (pediatric): Secondary | ICD-10-CM | POA: Diagnosis not present

## 2017-06-29 DIAGNOSIS — I2510 Atherosclerotic heart disease of native coronary artery without angina pectoris: Secondary | ICD-10-CM | POA: Insufficient documentation

## 2017-06-29 DIAGNOSIS — Z955 Presence of coronary angioplasty implant and graft: Secondary | ICD-10-CM | POA: Insufficient documentation

## 2017-06-29 DIAGNOSIS — N1330 Unspecified hydronephrosis: Secondary | ICD-10-CM | POA: Diagnosis not present

## 2017-06-29 DIAGNOSIS — Z7951 Long term (current) use of inhaled steroids: Secondary | ICD-10-CM | POA: Insufficient documentation

## 2017-06-29 DIAGNOSIS — E785 Hyperlipidemia, unspecified: Secondary | ICD-10-CM | POA: Diagnosis not present

## 2017-06-29 HISTORY — PX: CYSTOSCOPY W/ URETERAL STENT PLACEMENT: SHX1429

## 2017-06-29 HISTORY — PX: CYSTOSCOPY WITH BIOPSY: SHX5122

## 2017-06-29 LAB — BASIC METABOLIC PANEL
Anion gap: 10 (ref 5–15)
BUN: 20 mg/dL (ref 6–20)
CALCIUM: 9.1 mg/dL (ref 8.9–10.3)
CO2: 28 mmol/L (ref 22–32)
CREATININE: 1.08 mg/dL (ref 0.61–1.24)
Chloride: 103 mmol/L (ref 101–111)
GFR calc non Af Amer: 58 mL/min — ABNORMAL LOW (ref >60)
GLUCOSE: 97 mg/dL (ref 65–99)
Potassium: 3.3 mmol/L — ABNORMAL LOW (ref 3.5–5.1)
Sodium: 141 mmol/L (ref 135–145)

## 2017-06-29 LAB — CBC
HCT: 34.4 % — ABNORMAL LOW (ref 39.0–52.0)
Hemoglobin: 12.4 g/dL — ABNORMAL LOW (ref 13.0–17.0)
MCH: 32.8 pg (ref 26.0–34.0)
MCHC: 36 g/dL (ref 30.0–36.0)
MCV: 91 fL (ref 78.0–100.0)
PLATELETS: 191 10*3/uL (ref 150–400)
RBC: 3.78 MIL/uL — ABNORMAL LOW (ref 4.22–5.81)
RDW: 12.9 % (ref 11.5–15.5)
WBC: 9 10*3/uL (ref 4.0–10.5)

## 2017-06-29 SURGERY — CYSTOSCOPY, WITH RETROGRADE PYELOGRAM AND URETERAL STENT INSERTION
Anesthesia: General

## 2017-06-29 MED ORDER — LACTATED RINGERS IV SOLN
INTRAVENOUS | Status: DC
  Administered 2017-06-29: 09:00:00 via INTRAVENOUS

## 2017-06-29 MED ORDER — ATENOLOL 25 MG PO TABS: 25.0000 mg | ORAL_TABLET | Freq: Two times a day (BID) | ORAL | Status: DC

## 2017-06-29 MED ORDER — FENTANYL CITRATE (PF) 100 MCG/2ML IJ SOLN
INTRAMUSCULAR | Status: AC
  Filled 2017-06-29: qty 2

## 2017-06-29 MED ORDER — DIPHENHYDRAMINE HCL 50 MG/ML IJ SOLN: 12.5000 mg | Freq: Four times a day (QID) | INTRAMUSCULAR | Status: DC | PRN

## 2017-06-29 MED ORDER — ATORVASTATIN CALCIUM 20 MG PO TABS
20.0000 mg | ORAL_TABLET | Freq: Every day | ORAL | Status: DC
  Administered 2017-06-29: 20 mg via ORAL
  Filled 2017-06-29: qty 1

## 2017-06-29 MED ORDER — ONDANSETRON HCL 4 MG/2ML IJ SOLN: 4.0000 mg | INTRAMUSCULAR | Status: DC | PRN

## 2017-06-29 MED ORDER — VERAPAMIL HCL ER 180 MG PO TBCR
180.0000 mg | EXTENDED_RELEASE_TABLET | Freq: Every day | ORAL | Status: DC
  Filled 2017-06-29: qty 1

## 2017-06-29 MED ORDER — TRAMADOL HCL 50 MG PO TABS: 50.0000 mg | ORAL_TABLET | ORAL | 0 refills | Status: DC | PRN

## 2017-06-29 MED ORDER — ISOSORBIDE MONONITRATE ER 60 MG PO TB24
90.0000 mg | ORAL_TABLET | Freq: Every day | ORAL | Status: DC
  Administered 2017-06-29: 90 mg via ORAL
  Filled 2017-06-29: qty 1

## 2017-06-29 MED ORDER — DEXAMETHASONE SODIUM PHOSPHATE 10 MG/ML IJ SOLN
INTRAMUSCULAR | Status: DC | PRN
  Administered 2017-06-29: 10 mg via INTRAVENOUS

## 2017-06-29 MED ORDER — AZELASTINE HCL 0.1 % NA SOLN
2.0000 | Freq: Every day | NASAL | Status: DC | PRN
  Filled 2017-06-29: qty 30

## 2017-06-29 MED ORDER — ONDANSETRON HCL 4 MG/2ML IJ SOLN
INTRAMUSCULAR | Status: DC | PRN
  Administered 2017-06-29: 4 mg via INTRAVENOUS

## 2017-06-29 MED ORDER — SODIUM CHLORIDE 0.9 % IR SOLN
Status: DC | PRN
  Administered 2017-06-29: 3000 mL via INTRAVESICAL

## 2017-06-29 MED ORDER — MORPHINE SULFATE (PF) 2 MG/ML IV SOLN: 2.0000 mg | INTRAVENOUS | Status: DC | PRN

## 2017-06-29 MED ORDER — ATENOLOL 25 MG PO TABS
25.0000 mg | ORAL_TABLET | Freq: Once | ORAL | Status: AC
  Administered 2017-06-29: 25 mg via ORAL
  Filled 2017-06-29 (×2): qty 1

## 2017-06-29 MED ORDER — HYDROCODONE-ACETAMINOPHEN 5-325 MG PO TABS: 1.0000 | ORAL_TABLET | ORAL | Status: DC | PRN

## 2017-06-29 MED ORDER — SODIUM CHLORIDE 0.9 % IV SOLN
INTRAVENOUS | Status: DC
  Administered 2017-06-29: 14:00:00 via INTRAVENOUS

## 2017-06-29 MED ORDER — FENTANYL CITRATE (PF) 100 MCG/2ML IJ SOLN
INTRAMUSCULAR | Status: DC | PRN
  Administered 2017-06-29: 25 ug via INTRAVENOUS

## 2017-06-29 MED ORDER — ASPIRIN EC 81 MG PO TBEC
162.5000 mg | DELAYED_RELEASE_TABLET | Freq: Two times a day (BID) | ORAL | Status: DC
Start: 2017-06-29 — End: 2017-06-29
  Administered 2017-06-29: 162.5 mg via ORAL
  Filled 2017-06-29: qty 3

## 2017-06-29 MED ORDER — FLUTICASONE PROPIONATE 50 MCG/ACT NA SUSP
1.0000 | Freq: Every day | NASAL | Status: DC | PRN
  Filled 2017-06-29: qty 16

## 2017-06-29 MED ORDER — ZOLPIDEM TARTRATE 5 MG PO TABS: 5.0000 mg | ORAL_TABLET | Freq: Every evening | ORAL | Status: DC | PRN

## 2017-06-29 MED ORDER — PROPOFOL 10 MG/ML IV BOLUS
INTRAVENOUS | Status: DC | PRN
  Administered 2017-06-29: 110 mg via INTRAVENOUS

## 2017-06-29 MED ORDER — DEXAMETHASONE SODIUM PHOSPHATE 10 MG/ML IJ SOLN
INTRAMUSCULAR | Status: AC
  Filled 2017-06-29: qty 1

## 2017-06-29 MED ORDER — LIDOCAINE 2% (20 MG/ML) 5 ML SYRINGE
INTRAMUSCULAR | Status: DC | PRN
  Administered 2017-06-29 (×2): 50 mg via INTRAVENOUS

## 2017-06-29 MED ORDER — IOHEXOL 300 MG/ML  SOLN
INTRAMUSCULAR | Status: DC | PRN
  Administered 2017-06-29: 13 mL

## 2017-06-29 MED ORDER — BELLADONNA-OPIUM 16.2-30 MG RE SUPP: 1.0000 | Freq: Four times a day (QID) | RECTAL | Status: DC | PRN

## 2017-06-29 MED ORDER — HYDROCHLOROTHIAZIDE 12.5 MG PO CAPS
12.5000 mg | ORAL_CAPSULE | Freq: Every day | ORAL | Status: DC
  Administered 2017-06-29: 12.5 mg via ORAL
  Filled 2017-06-29: qty 1

## 2017-06-29 MED ORDER — LIDOCAINE 2% (20 MG/ML) 5 ML SYRINGE
INTRAMUSCULAR | Status: AC
  Filled 2017-06-29: qty 5

## 2017-06-29 MED ORDER — PROPOFOL 10 MG/ML IV BOLUS
INTRAVENOUS | Status: AC
  Filled 2017-06-29: qty 20

## 2017-06-29 MED ORDER — FENTANYL CITRATE (PF) 100 MCG/2ML IJ SOLN: 25.0000 ug | INTRAMUSCULAR | Status: DC | PRN

## 2017-06-29 MED ORDER — ONDANSETRON HCL 4 MG/2ML IJ SOLN
INTRAMUSCULAR | Status: AC
  Filled 2017-06-29: qty 2

## 2017-06-29 MED ORDER — AZELASTINE-FLUTICASONE 137-50 MCG/ACT NA SUSP
1.0000 | Freq: Every day | NASAL | Status: DC | PRN
Start: 2017-06-29 — End: 2017-06-29

## 2017-06-29 MED ORDER — DIPHENHYDRAMINE HCL 12.5 MG/5ML PO ELIX: 12.5000 mg | ORAL_SOLUTION | Freq: Four times a day (QID) | ORAL | Status: DC | PRN

## 2017-06-29 MED ORDER — CEFAZOLIN SODIUM-DEXTROSE 2-4 GM/100ML-% IV SOLN
2.0000 g | INTRAVENOUS | Status: AC
  Administered 2017-06-29: 2 g via INTRAVENOUS
  Filled 2017-06-29: qty 100

## 2017-06-29 MED ORDER — SODIUM CHLORIDE 0.9 % IR SOLN
3000.0000 mL | Status: DC
  Administered 2017-06-29: 3000 mL

## 2017-06-29 SURGICAL SUPPLY — 36 items
BAG URINE DRAINAGE (UROLOGICAL SUPPLIES) ×3 IMPLANT
BAG URO CATCHER STRL LF (MISCELLANEOUS) ×3 IMPLANT
BASKET DAKOTA 1.9FR 11X120 (BASKET) IMPLANT
BASKET LASER NITINOL 1.9FR (BASKET) IMPLANT
CATH FOLEY 2WAY SLVR  5CC 22FR (CATHETERS)
CATH FOLEY 2WAY SLVR 5CC 22FR (CATHETERS) IMPLANT
CATH FOLEY 3WAY 30CC 22FR (CATHETERS) ×3 IMPLANT
CATH FOLEY LATEX FREE 20FR (CATHETERS)
CATH FOLEY LF 20FR (CATHETERS) IMPLANT
CATH INTERMIT  6FR 70CM (CATHETERS) ×3 IMPLANT
CLOTH BEACON ORANGE TIMEOUT ST (SAFETY) ×3 IMPLANT
COVER SURGICAL LIGHT HANDLE (MISCELLANEOUS) ×3 IMPLANT
ELECT REM PT RETURN 15FT ADLT (MISCELLANEOUS) ×3 IMPLANT
EVACUATOR MICROVAS BLADDER (UROLOGICAL SUPPLIES) IMPLANT
EXTRACTOR STONE NITINOL NGAGE (UROLOGICAL SUPPLIES) IMPLANT
FIBER LASER TRAC TIP (UROLOGICAL SUPPLIES) IMPLANT
GLOVE BIO SURGEON STRL SZ8 (GLOVE) ×3 IMPLANT
GLOVE BIOGEL M 8.0 STRL (GLOVE) ×3 IMPLANT
GOWN STRL REUS W/TWL LRG LVL3 (GOWN DISPOSABLE) ×3 IMPLANT
GOWN STRL REUS W/TWL XL LVL3 (GOWN DISPOSABLE) ×3 IMPLANT
GUIDEWIRE ANG ZIPWIRE 038X150 (WIRE) IMPLANT
GUIDEWIRE STR DUAL SENSOR (WIRE) ×3 IMPLANT
IV NS 1000ML (IV SOLUTION)
IV NS 1000ML BAXH (IV SOLUTION) IMPLANT
LOOP CUT BIPOLAR 24F LRG (ELECTROSURGICAL) ×3 IMPLANT
MANIFOLD NEPTUNE II (INSTRUMENTS) ×3 IMPLANT
PACK CYSTO (CUSTOM PROCEDURE TRAY) ×3 IMPLANT
PLUG CATH AND CAP STER (CATHETERS) ×3 IMPLANT
SET ASPIRATION TUBING (TUBING) IMPLANT
SHEATH ACCESS URETERAL 38CM (SHEATH) IMPLANT
STENT CONTOUR 6FRX24X.038 (STENTS) ×6 IMPLANT
STENT CONTOUR 6FRX26X.038 (STENTS) IMPLANT
SYR CONTROL 10ML LL (SYRINGE) ×3 IMPLANT
SYRINGE IRR TOOMEY STRL 70CC (SYRINGE) IMPLANT
TUBE FEEDING 8FR 16IN STR KANG (MISCELLANEOUS) IMPLANT
TUBING CONNECTING 10 (TUBING) ×3 IMPLANT

## 2017-06-29 NOTE — Anesthesia Preprocedure Evaluation (Signed)
Anesthesia Evaluation  Patient identified by MRN, date of birth, ID band Patient awake    Reviewed: Allergy & Precautions, NPO status , Patient's Chart, lab work & pertinent test results  Airway Mallampati: II  TM Distance: >3 FB     Dental   Pulmonary sleep apnea , former smoker,    breath sounds clear to auscultation       Cardiovascular hypertension, + CAD and + Peripheral Vascular Disease   Rhythm:Regular Rate:Normal     Neuro/Psych    GI/Hepatic Neg liver ROS, GERD  ,  Endo/Other  negative endocrine ROS  Renal/GU negative Renal ROS     Musculoskeletal  (+) Arthritis ,   Abdominal   Peds  Hematology   Anesthesia Other Findings   Reproductive/Obstetrics                             Anesthesia Physical Anesthesia Plan  ASA: III  Anesthesia Plan:    Post-op Pain Management:    Induction: Intravenous  PONV Risk Score and Plan: 2 and Ondansetron and Dexamethasone  Airway Management Planned:   Additional Equipment:   Intra-op Plan:   Post-operative Plan: Extubation in OR  Informed Consent: I have reviewed the patients History and Physical, chart, labs and discussed the procedure including the risks, benefits and alternatives for the proposed anesthesia with the patient or authorized representative who has indicated his/her understanding and acceptance.   Dental advisory given  Plan Discussed with: CRNA and Anesthesiologist  Anesthesia Plan Comments:         Anesthesia Quick Evaluation

## 2017-06-29 NOTE — Anesthesia Procedure Notes (Signed)
Procedure Name: LMA Insertion Date/Time: 06/29/2017 11:00 AM Performed by: Claudia Desanctis Pre-anesthesia Checklist: Emergency Drugs available, Patient identified, Suction available and Patient being monitored Patient Re-evaluated:Patient Re-evaluated prior to induction Oxygen Delivery Method: Circle system utilized Preoxygenation: Pre-oxygenation with 100% oxygen Induction Type: IV induction Ventilation: Mask ventilation without difficulty LMA: LMA inserted LMA Size: 5.0 Number of attempts: 1 Placement Confirmation: positive ETCO2 and breath sounds checked- equal and bilateral Tube secured with: Tape Dental Injury: Teeth and Oropharynx as per pre-operative assessment

## 2017-06-29 NOTE — H&P (Signed)
Urology Admission H&P  Chief Complaint: b/l hydronephrosis  History of Present Illness: [name] is a 83yo with a hx of high grade bladder cancer who developed b/l flank pain and was found to have bilateral hydro on CT. He has severe right lateral wall bladder thickening concerning for malignancy  Past Medical History:  Diagnosis Date  . Allergic rhinitis   . Bilateral carotid artery stenosis    per last duplex [id number]  RICA 16-60%,  LICA 6-30%  . Bladder cancer [facility]) urologist-  dr Alyson Ingles   Non-invasise High Grade  . Chronic cough    ? lisinopril intolerence  . Coronary artery disease cardiologiat-  dr Shelva Majestic   hx PCI and stenting 2003/  per cardiac cath total occlusion RCA w/ collaterals 1995  . Diverticulosis of colon   . History of adenomatous polyp of colon    tubular adenoma's 1992; 1993; 1997; 2007  . History of colonic polyps   . History of nonmelanoma skin cancer    BCC and SCC right side nose  s/p moh's reseciton and reconstruction 2013/  left ear  01/ 2017/   06/ 2017 in office excision 2 areas on scalp  . History of TIAs takes ASA   pt states has a few what he thinks ia tia's, states last time with symptoms of veritgo and thought process was 04/ 2017  . HTN (hypertension)   . Hypercholesterolemia   . Non-Hodgkin lymphoma, unspecified, unspecified site [facility]) oncologist-  dr Benay Spice--  pt in clinical remission since 2000   dx 1996  no specific therapy since 2000  . OA (osteoarthritis)   . OSA (obstructive sleep apnea)    moderate per study 08-10-2013--  intolerant cpap  . Prostate nodule   . Thrombocytopenia (HCC)    chronic mild  . Wears glasses    Past Surgical History:  Procedure Laterality Date  . CARDIOVASCULAR STRESS TEST  07-25-2016 dr Shelva Majestic   Intermediate risk  nuclear study w/ a medium defect of moderate severity present in the basal inferolateral, mid inferlateral , and apical inferior location-- Findings consistent w/ ischemia and prior  MI w/ per-infarct ischemia-- Per Dr [name] mild inferior ischemia; similar defect in 2014 study but less scar on present study:  Nuclear stress EF 52%  . CATARACT EXTRACTION W/ INTRAOCULAR LENS  IMPLANT, BILATERAL  2013  . CORONARY ANGIOPLASTY  1995   total occlusion RCA w/ collaterals;  PTCA 2nd marginal LCFX  . CORONARY ANGIOPLASTY WITH STENT PLACEMENT  01-05-2002  dr Shelva Majestic   Complex high-speed rotational atherectomy of entire LAD multiple sites and stenting proximal and mid LAD/  no re-stenosis in prior CFX marginal dilatation site and RCA occluded w/ left to right collaterals,  ef 57%  . CYSTOSCOPY W/ RETROGRADES Bilateral 12/22/2016   Procedure: CYSTOSCOPY WITH RETROGRADE PYELOGRAM;  Surgeon: Cleon Gustin, MD;  Location: [facility];  Service: Urology;  Laterality: Bilateral;  . CYSTOSCOPY/RETROGRADE/URETEROSCOPY/STONE EXTRACTION WITH BASKET Bilateral 05/05/2016   Procedure: CYSTOSCOPY WITH RETROGRADE PYELOGRAM;  Surgeon: Cleon Gustin, MD;  Location: [facility];  Service: Urology;  Laterality: Bilateral;  . MOHS SURGERY  2013   at [facility]   nose -- w/ Reconstructive surgery same year  . MOHS SURGERY  01/ 2017   left ear  . REFRACTIVE SURGERY Left 05/ 2017  . TONSILLECTOMY  age 88  . TOTAL KNEE ARTHROPLASTY Left 03-07-2002  . TOTAL SHOULDER ARTHROPLASTY Left 04-30-2005  . TRANSTHORACIC ECHOCARDIOGRAM  04-29-2012   borderline  inferior wall hypokinesis, ef 65%/  mild dilated RV/  mild to moderate AV calcification without stenosis/  mild AR, TR, and  MR/  moderate PR/  mild aortic root dilatation  . TRANSURETHRAL RESECTION OF BLADDER TUMOR N/A 12/22/2016   Procedure: TRANSURETHRAL RESECTION OF BLADDER TUMOR (TURBT);  Surgeon: Cleon Gustin, MD;  Location: [facility];  Service: Urology;  Laterality: N/A;  . TRANSURETHRAL RESECTION OF BLADDER TUMOR WITH GYRUS (TURBT-GYRUS) N/A 05/05/2016   Procedure: TRANSURETHRAL RESECTION OF BLADDER TUMOR WITH GYRUS  (TURBT-GYRUS);  Surgeon: Cleon Gustin, MD;  Location: [facility];  Service: Urology;  Laterality: N/A;  . TRANSURETHRAL RESECTION OF BLADDER TUMOR WITH GYRUS (TURBT-GYRUS) N/A 06/09/2016   Procedure: TRANSURETHRAL RESECTION OF BLADDER TUMOR WITH GYRUS (TURBT-GYRUS);  Surgeon: Cleon Gustin, MD;  Location: [facility]  Service: Urology;  Laterality: N/A;    Home Medications:  Prescriptions Prior to Admission  Medication Sig Dispense Refill Last Dose  . acetaminophen (TYLENOL) 500 MG tablet Take 500 mg by mouth 3 (three) times daily as needed for moderate pain or headache.   06/28/2017 at Unknown time  . Ascorbic Acid (VITAMIN C) 500 MG tablet Take 500 mg by mouth daily.    06/27/2017  . aspirin EC 325 MG tablet Take 162.5 mg by mouth 2 (two) times daily.   06/26/2017  . atenolol (TENORMIN) 50 MG tablet TAKE 1/2 TABLET (25 MG TOTAL) BY MOUTH 2 (TWO) TIMES DAILY. 90 tablet 10 06/29/2017 at 0925  . atorvastatin (LIPITOR) 20 MG tablet TAKE 1 TABLET (20 MG TOTAL) BY MOUTH DAILY. (Patient taking differently: TAKE 1 TABLET (20 MG TOTAL) BY MOUTH DAILY AT NIGHT) 90 tablet 3 06/27/2017  . Azelastine-Fluticasone 137-50 MCG/ACT SUSP Place 1 spray into the nose daily as needed. (Patient taking differently: Place 1 spray into the nose daily as needed (congestion). ) 23 g 2 06/27/2017  . B Complex-C-Calcium (B-COMPLEX/VITAMIN C) TABS Take one tablet by mouth every 3 days  In AM   06/26/2017  . docusate sodium (COLACE) 100 MG capsule Take 200 mg by mouth daily as needed for mild constipation.   06/24/2017  . Emollient (CERAVE EX) Apply 1 application topically daily as needed (rough skin).   06/28/2017 at Unknown time  . Emollient (LUBRIDERM INTENSE SKIN REPAIR EX) Apply 1 application topically daily as needed (dry skin).   06/28/2017 at Unknown time  . fish oil-omega-3 fatty acids 1000 MG capsule Take 1 g by mouth daily. Take one capsule by mouth daily   06/27/2017  . hydrochlorothiazide (MICROZIDE)  12.5 MG capsule TAKE ONE CAPSULE BY MOUTH DAILY 90 capsule 3 06/28/2017 at Unknown time  . isosorbide mononitrate (IMDUR) 60 MG 24 hr tablet TAKE ONE AND ONE-HALF (1  1/2) TABLETS BY MOUTH DAILY AROUND NOON (Patient taking differently: TAKE ONE AND ONE-HALF (90mg ) TABLETS BY MOUTH DAILY AROUND NOON) 90 tablet 1 06/28/2017 at Unknown time  . Multiple Vitamins-Minerals (MULTIVITAMIN WITH MINERALS) tablet Take one tablet by mouth every other day  In AM   06/26/2017  . verapamil (CALAN-SR) 180 MG CR tablet Take 1 tablet (180 mg total) by mouth at bedtime. 30 tablet 11 06/28/2017 at Unknown time  . chlorhexidine (PERIDEX) 0.12 % solution Use as directed 15 mLs in the mouth or throat 2 (two) times daily. (Patient not taking: Reported on 06/26/2017) 120 mL 0 Completed Course at Unknown time  . clindamycin (CLEOCIN) 300 MG capsule Take 1 capsule (300 mg total) by mouth 3 (three) times  daily. (Patient not taking: Reported on 06/26/2017) 21 capsule 0 Completed Course at Unknown time  . predniSONE (DELTASONE) 20 MG tablet Take 1 tablet by mouth daily for 5 days (Patient not taking: Reported on 04/24/2017) 5 tablet 0 Completed Course at Unknown time   Allergies:  Allergies  Allergen Reactions  . Penicillins Other (See Comments)    Did not help with strep throat--Unknown reaction  Has patient had a PCN reaction causing immediate rash, facial/tongue/throat swelling, SOB or lightheadedness with hypotension: No Has patient had a PCN reaction causing severe rash involving mucus membranes or skin necrosis: No Has patient had a PCN reaction that required hospitalization: No Has patient had a PCN reaction occurring within the last 10 years: No If all of the above answers are "NO", then may proceed with Cephalosporin use.      Family History  Problem Relation Age of Onset  . Pancreatic cancer Mother   . Heart disease Father    Social History:  reports that he quit smoking about 50 years ago. His smoking use included  Cigarettes. He has a 40.00 pack-year smoking history. He has never used smokeless tobacco. He reports that he does not drink alcohol or use drugs.  Review of Systems  Constitutional: Positive for malaise/fatigue.  Gastrointestinal: Positive for nausea.  Genitourinary: Positive for flank pain.  All other systems reviewed and are negative.   Physical Exam:  Vital signs in last 24 hours: Temp:  [97.5 F (36.4 C)] 97.5 F (36.4 C) (07/30 0813) Pulse Rate:  [85] 85 (07/30 0813) Resp:  [18] 18 (07/30 0813) BP: (143)/(100) 143/100 (07/30 0813) SpO2:  [98 %] 98 % (07/30 0813) Weight:  [79.4 kg (175 lb 2 oz)] 79.4 kg (175 lb 2 oz) (07/30 0813) Physical Exam  Constitutional: He is oriented to person, place, and time. He appears well-developed and well-nourished.  HENT:  Head: Normocephalic and atraumatic.  Eyes: Pupils are equal, round, and reactive to light. EOM are normal.  Neck: Normal range of motion. No thyromegaly present.  Cardiovascular: Normal rate and regular rhythm.   Respiratory: Effort normal. No respiratory distress.  GI: Soft. He exhibits no distension.  Musculoskeletal: Normal range of motion. He exhibits no edema.  Neurological: He is alert and oriented to person, place, and time.  Skin: Skin is warm and dry.  Psychiatric: He has a normal mood and affect. His behavior is normal. Judgment and thought content normal.    Laboratory Data:  Results for orders placed or performed during the hospital encounter of 06/29/17 (from the past 24 hour(s))  Basic metabolic panel     Status: Abnormal   Collection Time: 06/29/17  9:27 AM  Result Value Ref Range   Sodium 141 135 - 145 mmol/L   Potassium 3.3 (L) 3.5 - 5.1 mmol/L   Chloride 103 101 - 111 mmol/L   CO2 28 22 - 32 mmol/L   Glucose, Bld 97 65 - 99 mg/dL   BUN 20 6 - 20 mg/dL   Creatinine, Ser 1.08 0.61 - 1.24 mg/dL   Calcium 9.1 8.9 - 10.3 mg/dL   GFR calc non Af Amer 58 (L) >60 mL/min   GFR calc Af Amer >60 >60 mL/min    Anion gap 10 5 - 15  CBC     Status: Abnormal   Collection Time: 06/29/17  9:27 AM  Result Value Ref Range   WBC 9.0 4.0 - 10.5 K/uL   RBC 3.78 (L) 4.22 - 5.81 MIL/uL   Hemoglobin  12.4 (L) 13.0 - 17.0 g/dL   HCT 34.4 (L) 39.0 - 52.0 %   MCV 91.0 78.0 - 100.0 fL   MCH 32.8 26.0 - 34.0 pg   MCHC 36.0 30.0 - 36.0 g/dL   RDW 12.9 11.5 - 15.5 %   Platelets 191 150 - 400 K/uL   No results found for this or any previous visit (from the past 240 hour(s)). Creatinine:  Recent Labs  06/29/17 0927  CREATININE 1.08   Baseline Creatinine: 1  Impression/Assessment:  83yo with bladder cancer and bilateral hydronephrosis  Plan:  The risks/benefits/alternatives to bilateral retrogrades, possible stent placement and bladder biopsy was explained to the patient and he understands and wisehs to proceed with surgery  Nicolette Bang 06/29/2017, 10:49 AM

## 2017-06-29 NOTE — Progress Notes (Signed)
Md aware of BP in pacu.  No orders received pt may tx to room at this time

## 2017-06-29 NOTE — Op Note (Signed)
.  Preoperative diagnosis: bladder tumor  Postoperative diagnosis: Same  Procedure: 1 cystoscopy 2. bilateral retrograde pyelography 3.  Intraoperative fluoroscopy, under one hour, with interpretation 4.   Transurethral resection of bladder tumor, small 5. Bilateral 6x26 JJ ureteral stent placement  Attending: Rosie Fate  Anesthesia: General  Estimated blood loss: Minimal  Drains: 1. 22 French foley 2. Bilateral 6x26 JJ ureteral stents  Specimens: left lateral wall and trigone tumor  Antibiotics: ancef  Findings: multiple sessile tumor under the mucosa.  Ureteral orifices in normal anatomic location. Severe right and moderate left hydronephrosis.  Indications: Patient is a 83 year old male with a history of bladder cancer and bilateral hydronephrosis on CT scan.  After discussing treatment options, they decided proceed with transurethral resection of a bladder tumor.  Procedure her in detail: The patient was brought to the operating room and a brief timeout was done to ensure correct patient, correct procedure, correct site.  General anesthesia was administered patient was placed in dorsal lithotomy position.  Their genitalia was then prepped and draped in usual sterile fashion.  A rigid 77 French cystoscope was passed in the urethra and the bladder.  Bladder was inspected and we noted multiple 1cm bladder tumors under the mucosa.  the ureteral orifices were in the normal orthotopic locations.  a 6 french ureteral catheter was then instilled into the left ureteral orifice.  a gentle retrograde was obtained and findings noted above. We then advanced a sensor wire up to the renal pelvis. We then advanced a 6x26 JJ ureteral stent up to the renal pelvis. We then removed the wire and good coil was noted in the renal pelvis under fluoroscopy and the bladder under direct vision. We then turned our attention to the right side. a 6 french ureteral catheter was then instilled into the right  ureteral orifice.  a gentle retrograde was obtained and findings noted above. We then advanced a sensor wire up to the renal pelvis. We then advanced a 6x26 JJ ureteral stent up to the renal pelvis. We then removed the wire and good coil was noted in the renal pelvis under fluoroscopy and the bladder under direct vision. We then removed the cystoscope and placed a resectoscope into the bladder. We proceeded to remove the large clot burden from the bladder. Once this was complete we turned our attention to the bladder tumor. Using the bipolar resectoscope we removed the bladder tumor down to the base. A subsequent muscle deep biopsy was then taken. Hemostasis was then obtained with electrocautery. We then removed the bladder tumor chips and sent them for pathology. We then re-inspected the bladder and found no residula bleeding.  the bladder was then drained, a 22 French foley was placed and this concluded the procedure which was well tolerated by patient.  Complications: None  Condition: Stable, extubated, transferred to PACU  Plan: Patient is admitted overnight with continuous bladder irrigation. If their urine is clear tomorrow they will be discharged home and followup in 5 days for foley catheter removal and pathology discussion.

## 2017-06-29 NOTE — Discharge Instructions (Signed)
Indwelling Urinary Catheter Care, Adult  Take good care of your catheter to keep it working and to prevent problems.  How to wear your catheter  Attach your catheter to your leg with tape (adhesive tape) or a leg strap. Make sure it is not too tight. If you use tape, remove any bits of tape that are already on the catheter.  How to wear a drainage bag  You should have:   A large overnight bag.   A small leg bag.    Overnight Bag  You may wear the overnight bag at any time. Always keep the bag below the level of your bladder but off the floor. When you sleep, put a clean plastic bag in a wastebasket. Then hang the bag inside the wastebasket.  Leg Bag  Never wear the leg bag at night. Always wear the leg bag below your knee. Keep the leg bag secure with a leg strap or tape.  How to care for your skin   Clean the skin around the catheter at least once every day.   Shower every day. Do not take baths.   Put creams, lotions, or ointments on your genital area only as told by your doctor.   Do not use powders, sprays, or lotions on your genital area.  How to clean your catheter and your skin  1. Wash your hands with soap and water.  2. Wet a washcloth in warm water and gentle (mild) soap.  3. Use the washcloth to clean the skin where the catheter enters your body. Clean downward and wipe away from the catheter in small circles. Do not wipe toward the catheter.  4. Pat the area dry with a clean towel. Make sure to clean off all soap.  How to care for your drainage bags  Empty your drainage bag when it is ?- full or at least 2-3 times a day. Replace your drainage bag once a month or sooner if it starts to smell bad or look dirty. Do not clean your drainage bag unless told by your doctor.  Emptying a drainage bag    Supplies Needed   Rubbing alcohol.   Gauze pad or cotton ball.   Tape or a leg strap.    Steps  1. Wash your hands with soap and water.  2. Separate (detach) the bag from your leg.  3. Hold the bag over  the toilet or a clean container. Keep the bag below your hips and bladder. This stops pee (urine) from going back into the tube.  4. Open the pour spout at the bottom of the bag.  5. Empty the pee into the toilet or container. Do not let the pour spout touch any surface.  6. Put rubbing alcohol on a gauze pad or cotton ball.  7. Use the gauze pad or cotton ball to clean the pour spout.  8. Close the pour spout.  9. Attach the bag to your leg with tape or a leg strap.  10. Wash your hands.    Changing a drainage bag  Supplies Needed   Alcohol wipes.   A clean drainage bag.   Adhesive tape or a leg strap.    Steps  1. Wash your hands with soap and water.  2. Separate the dirty bag from your leg.  3. Pinch the rubber catheter with your fingers so that pee does not spill out.  4. Separate the catheter tube from the drainage tube where these tubes connect (at the   connection valve). Do not let the tubes touch any surface.  5. Clean the end of the catheter tube with an alcohol wipe. Use a different alcohol wipe to clean the end of the drainage tube.  6. Connect the catheter tube to the drainage tube of the clean bag.  7. Attach the new bag to the leg with adhesive tape or a leg strap.  8. Wash your hands.    How to prevent infection and other problems   Never pull on your catheter or try to remove it. Pulling can damage tissue in your body.   Always wash your hands before and after touching your catheter.   If a leg strap gets wet, replace it with a dry one.   Drink enough fluids to keep your pee clear or pale yellow, or as told by your doctor.   Do not let the drainage bag or tubing touch the floor.   Wear cotton underwear.   If you are male, wipe from front to back after you poop (have a bowel movement).   Check on the catheter often to make sure it works and the tubing is not twisted.  Get help if:   Your pee is cloudy.   Your pee smells unusually bad.   Your pee is not draining into the bag.   Your  tube gets clogged.   Your catheter starts to leak.   Your bladder feels full.  Get help right away if:   You have redness, swelling, or pain where the catheter enters your body.   You have fluid, pus, or a bad smell coming from the area where the catheter enters your body.   The area where the catheter enters your body feels warm.   You have a fever.   You have pain in your:  ? Stomach (abdomen).  ? Legs.  ? Lower back.  ? Bladder.   You see blood fill the catheter.   Your pee is pink or red.   You feel sick to your stomach (nauseous).   You throw up (vomit).   You have chills.   Your catheter gets pulled out.  This information is not intended to replace advice given to you by your health care provider. Make sure you discuss any questions you have with your health care provider.  Document Released: 03/14/2013 Document Revised: 10/15/2016 Document Reviewed: 05/02/2014  [employer]vier Interactive Patient Education  2018 [employer]

## 2017-06-29 NOTE — Transfer of Care (Signed)
Immediate Anesthesia Transfer of Care Note  Patient: Billey Co  Procedure(s) Performed: Procedure(s): CYSTOSCOPY WITH RETROGRADE PYELOGRAM/URETERAL BILATERAL STENT PLACEMENT (Bilateral) CYSTOSCOPY WITH BIOPSY (N/A)  Patient Location: PACU  Anesthesia Type:General  Level of Consciousness: sedated  Airway & Oxygen Therapy: Patient Spontanous Breathing and Patient connected to face mask oxygen  Post-op Assessment: Report given to RN and Post -op Vital signs reviewed and stable  Post vital signs: Reviewed and stable  Last Vitals:  Vitals:   06/29/17 0813  BP: (!) 143/100  Pulse: 85  Resp: 18  Temp: (!) 36.4 C    Last Pain:  Vitals:   06/29/17 0813  TempSrc: Oral      Patients Stated Pain Goal: 3 (76/72/09 4709)  Complications: No apparent anesthesia complications

## 2017-06-29 NOTE — Anesthesia Postprocedure Evaluation (Signed)
Anesthesia Post Note  Patient: Derrick Rivera  Procedure(s) Performed: Procedure(s) (LRB): CYSTOSCOPY WITH RETROGRADE PYELOGRAM/URETERAL BILATERAL STENT PLACEMENT (Bilateral) CYSTOSCOPY WITH BIOPSY (N/A)     Patient location during evaluation: PACU Anesthesia Type: General Level of consciousness: awake Pain management: pain level controlled Vital Signs Assessment: post-procedure vital signs reviewed and stable Respiratory status: spontaneous breathing Cardiovascular status: stable Anesthetic complications: no    Last Vitals:  Vitals:   06/29/17 1300 06/29/17 1317  BP: (!) 163/100 (!) 151/68  Pulse: 69 70  Resp: 16 16  Temp:  36.9 C    Last Pain:  Vitals:   06/29/17 1325  TempSrc:   PainSc: 2                  Shey Bartmess

## 2017-06-30 ENCOUNTER — Encounter (HOSPITAL_COMMUNITY): Payer: Self-pay | Admitting: Urology

## 2017-07-03 DIAGNOSIS — C672 Malignant neoplasm of lateral wall of bladder: Secondary | ICD-10-CM | POA: Diagnosis not present

## 2017-07-06 ENCOUNTER — Encounter: Payer: Self-pay | Admitting: Radiation Oncology

## 2017-07-10 ENCOUNTER — Encounter: Payer: Self-pay | Admitting: Oncology

## 2017-07-10 ENCOUNTER — Telehealth: Payer: Self-pay | Admitting: Oncology

## 2017-07-10 NOTE — Telephone Encounter (Signed)
Appt has been scheduled for the pt to see Dr. Alen Blew on 8/15 at 11am. Pt aware to arrive 30 minutes early. Letter mailed to the pt and faxed to the referring.

## 2017-07-11 ENCOUNTER — Other Ambulatory Visit: Payer: Self-pay | Admitting: Cardiovascular Disease

## 2017-07-15 ENCOUNTER — Ambulatory Visit (HOSPITAL_BASED_OUTPATIENT_CLINIC_OR_DEPARTMENT_OTHER): Payer: Medicare HMO | Admitting: Oncology

## 2017-07-15 ENCOUNTER — Telehealth: Payer: Self-pay | Admitting: Oncology

## 2017-07-15 VITALS — BP 134/83 | HR 82 | Temp 98.2°F | Resp 16 | Ht 70.0 in | Wt 171.8 lb

## 2017-07-15 DIAGNOSIS — Z8572 Personal history of non-Hodgkin lymphomas: Secondary | ICD-10-CM | POA: Diagnosis not present

## 2017-07-15 DIAGNOSIS — Z87891 Personal history of nicotine dependence: Secondary | ICD-10-CM | POA: Diagnosis not present

## 2017-07-15 DIAGNOSIS — R599 Enlarged lymph nodes, unspecified: Secondary | ICD-10-CM

## 2017-07-15 DIAGNOSIS — C670 Malignant neoplasm of trigone of bladder: Secondary | ICD-10-CM

## 2017-07-15 DIAGNOSIS — C672 Malignant neoplasm of lateral wall of bladder: Secondary | ICD-10-CM

## 2017-07-15 DIAGNOSIS — Z808 Family history of malignant neoplasm of other organs or systems: Secondary | ICD-10-CM

## 2017-07-15 MED ORDER — PROCHLORPERAZINE MALEATE 10 MG PO TABS: 10.0000 mg | ORAL_TABLET | Freq: Four times a day (QID) | ORAL | 0 refills | Status: AC | PRN

## 2017-07-15 NOTE — Progress Notes (Signed)
Reason for Referral: Bladder cancer.   HPI: 83 year old gentleman currently of [place] where he lives majority of his life. He has a history of non-Hodgkin's lymphoma diagnosed in 1996 and underwent systemic therapy and have been off treatment since the year 2000. The exact details of his diagnosis and treatment is not available to me at this time. He subsequently developed recurrent hematuria in 2017. 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 He underwent treatment with Dr. Alyson Ingles including BCG. He had recurrent cystoscopies the most recent of which was on 06/29/2017. At that time TURBT of the left lateral wall and the trigone showed a high-grade urothelial carcinoma with detrusor muscle invasion was carcinoma. CT scan on 06/25/2017 showed diffusely calcified soft tissue density in the central small bowel mesentery which shows no significant changes. There is mild noncalcified retroperitoneal lymphadenopathy in the left periaortic and aortocaval space with the largest measuring 1.4 cm compared to 0.9 previously. No other lymphadenopathy noted or other sign of metastasis. Dr. Alyson Ingles felt that he is not a surgical candidate at this time. Based on these findings he was referred to me for evaluation for definitive therapy. He is scheduled to meet with Dr. Tammi Klippel for possible radiation therapy on 07/20/2017.   Clinically, he feels reasonably fair. He does report periodic back pain related to spinal stenosis. He does report some dysuria and some mild fatigue. He did report about 6 pound weight loss. Despite that, he remains ambulatory and attending most activities of daily living. He still able to drive and his performance status is not dramatically changed.  He does not report any headaches, blurry vision, syncope or seizures. He is not report any fevers, chills or sweats. He does not  report any cough, wheezing or hemoptysis. He does not report any nausea, vomiting or abdominal pain. He does not report any frequency urgency or hesitancy. He does not report any skeletal complaints. Remaining review of systems unremarkable.    Past Medical History:  Diagnosis Date  . Allergic rhinitis   . Bilateral carotid artery stenosis    per last duplex [id number]  RICA 54-09%,  LICA 8-11%  . Bladder cancer [facility]) urologist-  dr Alyson Ingles   Non-invasise High Grade  . Chronic cough    ? lisinopril intolerence  . Coronary artery disease cardiologiat-  dr Shelva Majestic   hx PCI and stenting 2003/  per cardiac cath total occlusion RCA w/ collaterals 1995  . Diverticulosis of colon   . History of adenomatous polyp of colon    tubular adenoma's 1992; 1993; 1997; 2007  . History of colonic polyps   . History of nonmelanoma skin cancer    BCC and SCC right side nose  s/p moh's reseciton and reconstruction 2013/  left ear  01/ 2017/   06/ 2017 in office excision 2 areas on scalp  . History of TIAs takes ASA   pt states has a few what he thinks ia tia's, states last time with symptoms of veritgo and thought process was 04/ 2017  . HTN (hypertension)   . Hypercholesterolemia   . Non-Hodgkin lymphoma, unspecified, unspecified site [facility]) oncologist-  dr Benay Spice--  pt in clinical remission since 2000   dx 1996  no specific therapy since 2000  . OA (osteoarthritis)   . OSA (obstructive sleep apnea)    moderate per study 08-10-2013--  intolerant cpap  . Prostate nodule   . Thrombocytopenia ([place])  chronic mild  . Wears glasses   :  Past Surgical History:  Procedure Laterality Date  . CARDIOVASCULAR STRESS TEST  07-25-2016 dr Shelva Majestic   Intermediate risk  nuclear study w/ a medium defect of moderate severity present in the basal inferolateral, mid inferlateral , and apical inferior location-- Findings consistent w/ ischemia and prior MI w/ per-infarct ischemia-- Per Dr [name] mild  inferior ischemia; similar defect in 2014 study but less scar on present study:  Nuclear stress EF 52%  . CATARACT EXTRACTION W/ INTRAOCULAR LENS  IMPLANT, BILATERAL  2013  . CORONARY ANGIOPLASTY  1995   total occlusion RCA w/ collaterals;  PTCA 2nd marginal LCFX  . CORONARY ANGIOPLASTY WITH STENT PLACEMENT  01-05-2002  dr Shelva Majestic   Complex high-speed rotational atherectomy of entire LAD multiple sites and stenting proximal and mid LAD/  no re-stenosis in prior CFX marginal dilatation site and RCA occluded w/ left to right collaterals,  ef 57%  . CYSTOSCOPY W/ RETROGRADES Bilateral 12/22/2016   Procedure: CYSTOSCOPY WITH RETROGRADE PYELOGRAM;  Surgeon: Cleon Gustin, MD;  Location[facility];  Service: Urology;  Laterality: Bilateral;  . CYSTOSCOPY W/ URETERAL STENT PLACEMENT Bilateral 06/29/2017   Procedure: CYSTOSCOPY WITH RETROGRADE PYELOGRAM/URETERAL BILATERAL STENT PLACEMENT;  Surgeon: Cleon Gustin, MD;  Location: [facility];  Service: Urology;  Laterality: Bilateral;  . CYSTOSCOPY WITH BIOPSY N/A 06/29/2017   Procedure: CYSTOSCOPY WITH BIOPSY;  Surgeon: Cleon Gustin, MD;  Location: [facility];  Service: Urology;  Laterality: N/A;  . CYSTOSCOPY/RETROGRADE/URETEROSCOPY/STONE EXTRACTION WITH BASKET Bilateral 05/05/2016   Procedure: CYSTOSCOPY WITH RETROGRADE PYELOGRAM;  Surgeon: Cleon Gustin, MD;  Location: [facility];  Service: Urology;  Laterality: Bilateral;  . MOHS SURGERY  2013   at [facility]   nose -- w/ Reconstructive surgery same year  . MOHS SURGERY  01/ 2017   left ear  . REFRACTIVE SURGERY Left 05/ 2017  . TONSILLECTOMY  age 55  . TOTAL KNEE ARTHROPLASTY Left 03-07-2002  . TOTAL SHOULDER ARTHROPLASTY Left 04-30-2005  . TRANSTHORACIC ECHOCARDIOGRAM  04-29-2012   borderline inferior wall hypokinesis, ef 65%/  mild dilated RV/  mild to moderate AV calcification without stenosis/  mild AR, TR, and  MR/  moderate PR/  mild aortic root dilatation  .  TRANSURETHRAL RESECTION OF BLADDER TUMOR N/A 12/22/2016   Procedure: TRANSURETHRAL RESECTION OF BLADDER TUMOR (TURBT);  Surgeon: Cleon Gustin, MD;  Location: [facility];  Service: Urology;  Laterality: N/A;  . TRANSURETHRAL RESECTION OF BLADDER TUMOR WITH GYRUS (TURBT-GYRUS) N/A 05/05/2016   Procedure: TRANSURETHRAL RESECTION OF BLADDER TUMOR WITH GYRUS (TURBT-GYRUS);  Surgeon: Cleon Gustin, MD;  Location: [facility];  Service: Urology;  Laterality: N/A;  . TRANSURETHRAL RESECTION OF BLADDER TUMOR WITH GYRUS (TURBT-GYRUS) N/A 06/09/2016   Procedure: TRANSURETHRAL RESECTION OF BLADDER TUMOR WITH GYRUS (TURBT-GYRUS);  Surgeon: Cleon Gustin, MD;  Location: [facility];  Service: Urology;  Laterality: N/A;  :   Current Outpatient Prescriptions:  .  Ascorbic Acid (VITAMIN C) 500 MG tablet, Take 500 mg by mouth daily. , Disp: , Rfl:  .  aspirin EC 325 MG tablet, Take 162.5 mg by mouth 2 (two) times daily., Disp: , Rfl:  .  atenolol (TENORMIN) 50 MG tablet, TAKE 1/2 TABLET (25 MG TOTAL) BY MOUTH 2 (TWO) TIMES DAILY., Disp: 90 tablet, Rfl: 10 .  atorvastatin (LIPITOR) 20 MG tablet, TAKE 1 TABLET (20 MG TOTAL) BY MOUTH DAILY. (Patient taking differently: TAKE  1 TABLET (20 MG TOTAL) BY MOUTH DAILY AT NIGHT), Disp: 90 tablet, Rfl: 3 .  Azelastine-Fluticasone 137-50 MCG/ACT SUSP, Place 1 spray into the nose daily as needed. (Patient taking differently: Place 1 spray into the nose daily as needed (congestion). ), Disp: 23 g, Rfl: 2 .  B Complex-C-Calcium (B-COMPLEX/VITAMIN C) TABS, Take one tablet by mouth every 3 days  In AM, Disp: , Rfl:  .  docusate sodium (COLACE) 100 MG capsule, Take 200 mg by mouth daily as needed for mild constipation., Disp: , Rfl:  .  fish oil-omega-3 fatty acids 1000 MG capsule, Take 1 g by mouth daily. Take one capsule by mouth daily, Disp: , Rfl:  .  hydrochlorothiazide (MICROZIDE) 12.5 MG capsule, TAKE ONE CAPSULE BY MOUTH DAILY, Disp: 90  capsule, Rfl: 3 .  isosorbide mononitrate (IMDUR) 60 MG 24 hr tablet, TAKE ONE AND ONE-HALF (1  1/2) TABLETS BY MOUTH DAILY AROUND NOON (Patient taking differently: TAKE ONE AND ONE-HALF (90mg ) TABLETS BY MOUTH DAILY AROUND NOON), Disp: 90 tablet, Rfl: 1 .  Multiple Vitamins-Minerals (MULTIVITAMIN WITH MINERALS) tablet, Take one tablet by mouth every other day  In AM, Disp: , Rfl:  .  traMADol (ULTRAM) 50 MG tablet, Take 1 tablet (50 mg total) by mouth every 4 (four) hours as needed., Disp: 30 tablet, Rfl: 0 .  verapamil (CALAN-SR) 180 MG CR tablet, Take 1 tablet (180 mg total) by mouth at bedtime., Disp: 30 tablet, Rfl: 11 .  prochlorperazine (COMPAZINE) 10 MG tablet, Take 1 tablet (10 mg total) by mouth every 6 (six) hours as needed for nausea or vomiting., Disp: 30 tablet, Rfl: 0:  Allergies  Allergen Reactions  . Penicillins Other (See Comments)    Did not help with strep throat--Unknown reaction  Has patient had a PCN reaction causing immediate rash, facial/tongue/throat swelling, SOB or lightheadedness with hypotension: No Has patient had a PCN reaction causing severe rash involving mucus membranes or skin necrosis: No Has patient had a PCN reaction that required hospitalization: No Has patient had a PCN reaction occurring within the last 10 years: No If all of the above answers are "NO", then may proceed with Cephalosporin use.    :  Family History  Problem Relation Age of Onset  . Pancreatic cancer Mother   . Heart disease Father   :  Social History   Social History  . Marital status: Married    Spouse name: Statistician  . Number of children: 3  . Years of education: N/A   Occupational History  . retired-sales Chief Financial Officer    Social History Main Topics  . Smoking status: Former Smoker    Packs/day: 2.00    Years: 20.00    Types: Cigarettes    Quit date: 12/01/1966  . Smokeless tobacco: Never Used  . Alcohol use No  . Drug use: No  . Sexual activity: Not on file   Other  Topics Concern  . Not on file   Social History Narrative   Married 61 years in 2015. 3 kids. 5 grandkids.       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 Then slef employed       Hobbies: family time, house and yard work, used to be a Air cabin crew, enjoys sports (football and basketball)      Cares for wife  :  Pertinent items are noted in HPI.  Exam: Blood pressure 134/83, pulse 82, temperature 98.2 F (36.8 C), temperature  source Oral, resp. rate 16, height 5\' 10"  (1.778 m), weight 171 lb 12.8 oz (77.9 kg), SpO2 96 %.  ECOG 1 General appearance: alert and cooperative appeared without distress. Throat: lips, mucosa, and tongue normal; teeth and gums normal Neck: no adenopathy Back: negative Resp: clear to auscultation bilaterally without wheezes or dullness to percussion. Cardio: regular rate and rhythm, S1, S2 normal, no murmur, click, rub or gallop GI: soft, non-tender; bowel sounds normal; no masses,  no organomegaly Extremities: extremities normal, atraumatic, no cyanosis or edema Pulses: 2+ and symmetric Skin: Skin color, texture, turgor normal. No rashes or lesions Lymph nodes: Cervical, supraclavicular, and axillary nodes normal.  CBC    Component Value Date/Time   WBC 9.0 06/29/2017 0927   RBC 3.78 (L) 06/29/2017 0927   HGB 12.4 (L) 06/29/2017 0927   HGB 15.8 11/01/2015 0912   HCT 34.4 (L) 06/29/2017 0927   HCT 46.9 11/01/2015 0912   PLT 191 06/29/2017 0927   PLT 126 (L) 11/01/2015 0912   MCV 91.0 06/29/2017 0927   MCV 98.1 (H) 11/01/2015 0912   MCH 32.8 06/29/2017 0927   MCHC 36.0 06/29/2017 0927   RDW 12.9 06/29/2017 0927   RDW 13.4 11/01/2015 0912   LYMPHSABS 2,436 07/14/2016 0907   LYMPHSABS 2.7 11/01/2015 0912   MONOABS 924 07/14/2016 0907   MONOABS 0.8 11/01/2015 0912   EOSABS 420 07/14/2016 0907   EOSABS 0.4 11/01/2015 0912   BASOSABS 0 07/14/2016 0907   BASOSABS 0.0 11/01/2015  0912     Chemistry      Component Value Date/Time   NA 141 06/29/2017 0927   K 3.3 (L) 06/29/2017 0927   CL 103 06/29/2017 0927   CO2 28 06/29/2017 0927   BUN 20 06/29/2017 0927   CREATININE 1.08 06/29/2017 0927   CREATININE 0.95 02/27/2017 0918      Component Value Date/Time   CALCIUM 9.1 06/29/2017 0927   ALKPHOS 65 02/27/2017 0918   AST 23 02/27/2017 0918   ALT 17 02/27/2017 0918   BILITOT 0.9 02/27/2017 0918        Assessment and Plan:    83 year old gentleman with the following issues:  1. High-grade urothelial carcinoma arising from the left lateral wall and the trigone of the bladder with involvement of the muscle. This was diagnosed on 06/29/2017 with previous history of superficial bladder tumor dating back to 2017. He was treated with BCG treatment in the past and now appears to have muscle invasive disease. His CT scan on 06/25/2017 that show mild retroperitoneal adenopathy although he does have history of treated lymphoma in the past.  The natural course of this disease was reviewed today with the patient and his family. He appears to be a poor surgical candidate for a radical cystectomy and definitive therapy with radiation concomitantly with chemotherapy appears to be reasonable. Despite his pelvic adenopathy treatment with curative intent is reasonable at this time. Complication associated with this therapy was discussed today and he will meet with Dr. Tammi Klippel for specifics regarding radiation treatment. Complications associated with chemotherapy portion of this regimen were reviewed today. His complication associated with carboplatin include nausea, fatigue, tiredness, anorexia, thrombocytopenia as well as weight loss. Symptoms of diarrhea and dysuria could also persist.  The schedule would be utilizing carboplatin at AUC of 2 on a weekly basis for 6 weeks with radiation. The goal of therapy would be curative at this time.  After discussion today he is willing to  proceed with such treatment after he  meets with Dr. Tammi Klippel regarding the details of radiation therapy. We will arrange chemotherapy education class and tentatively to start weekly chemotherapy in the first week of September. The schedule will be adjusted accordingly.  2. Antiemetics: Prescription for Compazine was given to the patient.  3. IV access: Peripheral veins will be used at this time.  4. History of non-Hodgkin's lymphoma: Previously treated with systemic chemotherapy with excellent response. He does have some residual adenopathy which could be related to a low-grade lymphoma rather than bladder malignancy.  5. Follow-up: Will be in the next 3-4 weeks to start treatment as planned.

## 2017-07-15 NOTE — Progress Notes (Signed)
START OFF PATHWAY REGIMEN - Bladder   [id number]:Carboplatin AUC=2:   Administer weekly:     Carboplatin   **Always confirm dose/schedule in your pharmacy ordering system**    Patient Characteristics: Pre Cystectomy, Clinical T2-T4a, N0-1, M0, Cystectomy Ineligible/Patient Refuses Cystectomy AJCC M Category: M0 AJCC N Category: N0 AJCC T Category: T2 Current evidence of distant metastases? No AJCC 8 Stage Grouping: II Intent of Therapy: Curative Intent, Discussed with Patient 

## 2017-07-15 NOTE — Telephone Encounter (Signed)
Scheduled appt per 8/15 los - Gave patient AVS and calender per los.  

## 2017-07-17 ENCOUNTER — Encounter: Payer: Self-pay | Admitting: Radiation Oncology

## 2017-07-17 NOTE — Progress Notes (Signed)
GU Location of Tumor / Histology: high grade urothelial carcinoma with detrusor muscle invasion   He has a history of non-Hodgkin's lymphoma diagnosed in 1996 and underwent systemic therapy and have been off treatment since the year 2000. He subsequently developed recurrent hematuria in 2017. 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 He underwent treatment with Dr. Alyson Ingles including BCG. He had recurrent cystoscopies the most recent of which was on 06/29/2017. At that time TURBT of the left lateral wall and the trigone showed a high-grade urothelial carcinoma with detrusor muscle invasion was carcinoma.     Past/Anticipated interventions by urology, if any: Hx of BCG. Poor surgical candidate for a radical cystectomy   Past/Anticipated interventions by medical oncology, if any: carboplatin at AUC of 2 on a weekly basis for 6 weeks with radiation starting first week of September.  Weight changes, if any: 6 lb weight loss x 2 month related to poor appetite.  Bowel/Bladder complaints, if any: Periodic low left back pain since bilateral stent placement, mild fatigue and dysuria. Denies taking or being prescribed AZO/pyridium to manage dysuria.  Reports urinary retention and describes his urine stream as a dribble. Reports he maintains bowel regularity with Miralax. Reports hematuria until 4 days ago.   Nausea/Vomiting, if any: reports intermittent nausea despite compazine but, denies emesis.  Pain issues, if any:  yes, intermittent low left back pain managed with tylenol or tramadol  SAFETY ISSUES:  Prior radiation? no  Pacemaker/ICD? no  Possible current pregnancy? no  Is the patient on methotrexate? no  Current Complaints / other details:  83 year old male. Mother had pancreatic cancer. Married with 3 children. 5 grandkids. Former smoker. Care for wife. Accompanied today  by wife and two daughters.

## 2017-07-20 ENCOUNTER — Encounter: Payer: Self-pay | Admitting: Radiation Oncology

## 2017-07-20 ENCOUNTER — Ambulatory Visit
Admission: RE | Admit: 2017-07-20 | Discharge: 2017-07-20 | Disposition: A | Discharge status: Home / Self Care | Payer: Medicare HMO | Source: Ambulatory Visit | Attending: Radiation Oncology | Admitting: Radiation Oncology

## 2017-07-20 ENCOUNTER — Other Ambulatory Visit: Payer: Medicare HMO

## 2017-07-20 VITALS — BP 120/90 | HR 101 | Resp 18 | Ht 70.0 in | Wt 172.2 lb

## 2017-07-20 DIAGNOSIS — Z96652 Presence of left artificial knee joint: Secondary | ICD-10-CM | POA: Insufficient documentation

## 2017-07-20 DIAGNOSIS — Z7982 Long term (current) use of aspirin: Secondary | ICD-10-CM | POA: Diagnosis not present

## 2017-07-20 DIAGNOSIS — Z800 Family history of malignant neoplasm of digestive organs: Secondary | ICD-10-CM | POA: Insufficient documentation

## 2017-07-20 DIAGNOSIS — G4733 Obstructive sleep apnea (adult) (pediatric): Secondary | ICD-10-CM | POA: Diagnosis not present

## 2017-07-20 DIAGNOSIS — Z8572 Personal history of non-Hodgkin lymphomas: Secondary | ICD-10-CM | POA: Insufficient documentation

## 2017-07-20 DIAGNOSIS — Z880 Allergy status to penicillin: Secondary | ICD-10-CM | POA: Insufficient documentation

## 2017-07-20 DIAGNOSIS — C672 Malignant neoplasm of lateral wall of bladder: Secondary | ICD-10-CM

## 2017-07-20 DIAGNOSIS — M1990 Unspecified osteoarthritis, unspecified site: Secondary | ICD-10-CM | POA: Diagnosis not present

## 2017-07-20 DIAGNOSIS — Z87891 Personal history of nicotine dependence: Secondary | ICD-10-CM | POA: Diagnosis not present

## 2017-07-20 DIAGNOSIS — R319 Hematuria, unspecified: Secondary | ICD-10-CM | POA: Insufficient documentation

## 2017-07-20 DIAGNOSIS — I2582 Chronic total occlusion of coronary artery: Secondary | ICD-10-CM | POA: Insufficient documentation

## 2017-07-20 DIAGNOSIS — E7800 Pure hypercholesterolemia, unspecified: Secondary | ICD-10-CM | POA: Diagnosis not present

## 2017-07-20 DIAGNOSIS — Z79891 Long term (current) use of opiate analgesic: Secondary | ICD-10-CM | POA: Insufficient documentation

## 2017-07-20 DIAGNOSIS — Z9841 Cataract extraction status, right eye: Secondary | ICD-10-CM | POA: Diagnosis not present

## 2017-07-20 DIAGNOSIS — R590 Localized enlarged lymph nodes: Secondary | ICD-10-CM | POA: Insufficient documentation

## 2017-07-20 DIAGNOSIS — I2510 Atherosclerotic heart disease of native coronary artery without angina pectoris: Secondary | ICD-10-CM | POA: Insufficient documentation

## 2017-07-20 DIAGNOSIS — Z9889 Other specified postprocedural states: Secondary | ICD-10-CM | POA: Insufficient documentation

## 2017-07-20 DIAGNOSIS — Z85828 Personal history of other malignant neoplasm of skin: Secondary | ICD-10-CM | POA: Insufficient documentation

## 2017-07-20 DIAGNOSIS — N139 Obstructive and reflux uropathy, unspecified: Secondary | ICD-10-CM

## 2017-07-20 DIAGNOSIS — Z79899 Other long term (current) drug therapy: Secondary | ICD-10-CM | POA: Diagnosis not present

## 2017-07-20 DIAGNOSIS — Z96619 Presence of unspecified artificial shoulder joint: Secondary | ICD-10-CM | POA: Diagnosis not present

## 2017-07-20 DIAGNOSIS — Z809 Family history of malignant neoplasm, unspecified: Secondary | ICD-10-CM

## 2017-07-20 DIAGNOSIS — I10 Essential (primary) hypertension: Secondary | ICD-10-CM | POA: Diagnosis not present

## 2017-07-20 DIAGNOSIS — Z510 Encounter for antineoplastic radiation therapy: Secondary | ICD-10-CM | POA: Diagnosis not present

## 2017-07-20 DIAGNOSIS — Z8249 Family history of ischemic heart disease and other diseases of the circulatory system: Secondary | ICD-10-CM | POA: Insufficient documentation

## 2017-07-20 DIAGNOSIS — Z8673 Personal history of transient ischemic attack (TIA), and cerebral infarction without residual deficits: Secondary | ICD-10-CM | POA: Diagnosis not present

## 2017-07-20 DIAGNOSIS — Z955 Presence of coronary angioplasty implant and graft: Secondary | ICD-10-CM | POA: Diagnosis not present

## 2017-07-20 DIAGNOSIS — Z808 Family history of malignant neoplasm of other organs or systems: Secondary | ICD-10-CM | POA: Diagnosis not present

## 2017-07-20 LAB — URINALYSIS, MICROSCOPIC - CHCC
Bilirubin (Urine): NEGATIVE
GLUCOSE UR CHCC: NEGATIVE mg/dL
Ketones: NEGATIVE mg/dL
NITRITE: NEGATIVE
PH: 6 (ref 4.6–8.0)
Protein: 300 mg/dL
SPECIFIC GRAVITY, URINE: 1.03 (ref 1.003–1.035)
UROBILINOGEN UR: 0.2 mg/dL (ref 0.2–1)

## 2017-07-20 MED ORDER — TAMSULOSIN HCL 0.4 MG PO CAPS: 0.4000 mg | ORAL_CAPSULE | Freq: Every day | ORAL | 2 refills | Status: AC

## 2017-07-20 NOTE — Progress Notes (Signed)
See progress note under physician encounter. 

## 2017-07-20 NOTE — Progress Notes (Signed)
[place]         [phone] ________________________________  Initial Outpatient Consultation  Name: Derrick Rivera MRN: [id number]  Date: 07/20/2017  DOB: 11/12/1924  REFERRING PHYSICIAN: Cleon Gustin, MD  DIAGNOSIS: 83 yo man with localized muscle invasive bladder cancer    ICD-10-CM   1. Malignant neoplasm of lateral wall of urinary bladder (HCC) C67.2    HISTORY OF PRESENT ILLNESS:Derrick Rivera is a 83 y.o. male who has a history of non-Hodgkin's lymphoma diagnosed in 1996 and underwent systemic therapy. He has  been off treatment since 2000. More recently he had developed  hematuria in the summer of 2017. He subsequently underwent TURBT with Dr. 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 Repeat TURBT on 06/09/17 revealed high grade papillary urothelial carcinoma with a few foci suspicious for superficial invasion was noted.  He then underwent treatment with Dr. Alyson Ingles including BCG x 1 cycle. TURBT on 12/22/16 was performed again revealing high grade papillary urothelial carcinoma without muscle invasion. He completed a second course of BCG. Repeat TURBT on 06/29/2017 showed left lateral wall and trigone, invasive high grade urothelial carcinoma involving the detrusor muscle. Bilateral ureteral stents were also placed at that time.   CT scan on 06/25/2017 showed diffusely calcified soft tissue density in the central small bowel mesentery which shows no significant changes. There is mild noncalcified retroperitoneal lymphadenopathy in the left periaortic and aortocaval space with the largest measuring 1.4 cm compared to 0.9 previously. No other lymphadenopathy noted or other sign of metastasis.   Dr. Alyson Ingles felt that he is not a surgical candidate for cystectomy and he was referred to Dr. Alen Blew for evaluation. Dr. Alen Blew recommended definitive therapy with radiation in combination with  chemotherapy. The schedule would be utilizing carboplatin at AUC of 2 on a weekly basis for 6 weeks with radiation, and comes today to detail this further.  PREVIOUS RADIATION THERAPY: No  Past Medical History:  Past Medical History:  Diagnosis Date  . Allergic rhinitis   . Bilateral carotid artery stenosis    per last duplex [id number]  RICA 18-29%,  LICA 9-37%  . Bladder cancer [facility]) urologist-  dr Alyson Ingles   Non-invasise High Grade  . Chronic cough    ? lisinopril intolerence  . Coronary artery disease cardiologiat-  dr Shelva Majestic   hx PCI and stenting 2003/  per cardiac cath total occlusion RCA w/ collaterals 1995  . Diverticulosis of colon   . History of adenomatous polyp of colon    tubular adenoma's 1992; 1993; 1997; 2007  . History of colonic polyps   . History of nonmelanoma skin cancer    BCC and SCC right side nose  s/p moh's reseciton and reconstruction 2013/  left ear  01/ 2017/   06/ 2017 in office excision 2 areas on scalp  . History of TIAs takes ASA   pt states has a few what he thinks ia tia's, states last time with symptoms of veritgo and thought process was 04/ 2017  . HTN (hypertension)   . Hypercholesterolemia   . Non-Hodgkin lymphoma, unspecified, unspecified [facility]) oncologist-  dr Benay Spice--  pt in clinical remission since 2000   dx 1996  no specific therapy since 2000  . OA (osteoarthritis)   . OSA (obstructive sleep apnea)    moderate per study 08-10-2013--  intolerant cpap  . Prostate nodule   . Thrombocytopenia (HCC)    chronic mild  .  Wears glasses     Past Surgical History: Past Surgical History:  Procedure Laterality Date  . CARDIOVASCULAR STRESS TEST  07-25-2016 dr Shelva Majestic   Intermediate risk  nuclear study w/ a medium defect of moderate severity present in the basal inferolateral, mid inferlateral , and apical inferior location-- Findings consistent w/ ischemia and prior MI w/ per-infarct ischemia-- Per Dr [name] mild inferior  ischemia; similar defect in 2014 study but less scar on present study:  Nuclear stress EF 52%  . CATARACT EXTRACTION W/ INTRAOCULAR LENS  IMPLANT, BILATERAL  2013  . CORONARY ANGIOPLASTY  1995   total occlusion RCA w/ collaterals;  PTCA 2nd marginal LCFX  . CORONARY ANGIOPLASTY WITH STENT PLACEMENT  01-05-2002  dr Shelva Majestic   Complex high-speed rotational atherectomy of entire LAD multiple sites and stenting proximal and mid LAD/  no re-stenosis in prior CFX marginal dilatation site and RCA occluded w/ left to right collaterals,  ef 57%  . CYSTOSCOPY W/ RETROGRADES Bilateral 12/22/2016   Procedure: CYSTOSCOPY WITH RETROGRADE PYELOGRAM;  Surgeon: Cleon Gustin, MD;  Location[facility];  Service: Urology;  Laterality: Bilateral;  . CYSTOSCOPY W/ URETERAL STENT PLACEMENT Bilateral 06/29/2017   Procedure: CYSTOSCOPY WITH RETROGRADE PYELOGRAM/URETERAL BILATERAL STENT PLACEMENT;  Surgeon: Cleon Gustin, MD;  Location: [facility];  Service: Urology;  Laterality: Bilateral;  . CYSTOSCOPY WITH BIOPSY N/A 06/29/2017   Procedure: CYSTOSCOPY WITH BIOPSY;  Surgeon: Cleon Gustin, MD;  Location: [facility];  Service: Urology;  Laterality: N/A;  . CYSTOSCOPY/RETROGRADE/URETEROSCOPY/STONE EXTRACTION WITH BASKET Bilateral 05/05/2016   Procedure: CYSTOSCOPY WITH RETROGRADE PYELOGRAM;  Surgeon: Cleon Gustin, MD;  Location: [facility];  Service: Urology;  Laterality: Bilateral;  . MOHS SURGERY  2013   at [facility]   nose -- w/ Reconstructive surgery same year  . MOHS SURGERY  01/ 2017   left ear  . REFRACTIVE SURGERY Left 05/ 2017  . TONSILLECTOMY  age 48  . TOTAL KNEE ARTHROPLASTY Left 03-07-2002  . TOTAL SHOULDER ARTHROPLASTY Left 04-30-2005  . TRANSTHORACIC ECHOCARDIOGRAM  04-29-2012   borderline inferior wall hypokinesis, ef 65%/  mild dilated RV/  mild to moderate AV calcification without stenosis/  mild AR, TR, and  MR/  moderate PR/  mild aortic root dilatation  . TRANSURETHRAL  RESECTION OF BLADDER TUMOR N/A 12/22/2016   Procedure: TRANSURETHRAL RESECTION OF BLADDER TUMOR (TURBT);  Surgeon: Cleon Gustin, MD;  Location: [facility];  Service: Urology;  Laterality: N/A;  . TRANSURETHRAL RESECTION OF BLADDER TUMOR WITH GYRUS (TURBT-GYRUS) N/A 05/05/2016   Procedure: TRANSURETHRAL RESECTION OF BLADDER TUMOR WITH GYRUS (TURBT-GYRUS);  Surgeon: Cleon Gustin, MD;  Location: [facility];  Service: Urology;  Laterality: N/A;  . TRANSURETHRAL RESECTION OF BLADDER TUMOR WITH GYRUS (TURBT-GYRUS) N/A 06/09/2016   Procedure: TRANSURETHRAL RESECTION OF BLADDER TUMOR WITH GYRUS (TURBT-GYRUS);  Surgeon: Cleon Gustin, MD;  Location: [facility];  Service: Urology;  Laterality: N/A;    Social History:  Social History   Social History  . Marital status: Married    Spouse name: Statistician  . Number of children: 3  . Years of education: N/A   Occupational History  . retired-sales Chief Financial Officer    Social History Main Topics  . Smoking status: Former Smoker    Packs/day: 2.00    Years: 20.00    Types: Cigarettes    Quit date: 12/01/1966  . Smokeless tobacco: Never Used  . Alcohol use No  . Drug use: No  .  Sexual activity: No   Other Topics Concern  . Not on file   Social History Narrative   Married 61 years in 2015. 3 kids. 5 grandkids.       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 Pancreatic cancer Mother   . Heart disease Father    Medications:  Current Outpatient Prescriptions:  .  Ascorbic Acid (VITAMIN C) 500 MG tablet, Take 500 mg by mouth daily. , Disp: , Rfl:  .  aspirin EC 325 MG tablet, Take 162.5 mg by mouth 2 (two) times daily.,  Disp: , Rfl:  .  atenolol (TENORMIN) 50 MG tablet, TAKE 1/2 TABLET (25 MG TOTAL) BY MOUTH 2 (TWO) TIMES DAILY., Disp: 90 tablet, Rfl: 10 .  atorvastatin (LIPITOR) 20 MG tablet, TAKE 1 TABLET (20 MG TOTAL) BY MOUTH DAILY. (Patient taking differently: TAKE 1 TABLET (20 MG TOTAL) BY MOUTH DAILY AT NIGHT), Disp: 90 tablet, Rfl: 3 .  Azelastine-Fluticasone 137-50 MCG/ACT SUSP, Place 1 spray into the nose daily as needed. (Patient taking differently: Place 1 spray into the nose daily as needed (congestion). ), Disp: 23 g, Rfl: 2 .  B Complex-C-Calcium (B-COMPLEX/VITAMIN C) TABS, Take one tablet by mouth every 3 days  In AM, Disp: , Rfl:  .  docusate sodium (COLACE) 100 MG capsule, Take 200 mg by mouth daily as needed for mild constipation., Disp: , Rfl:  .  fish oil-omega-3 fatty acids 1000 MG capsule, Take 1 g by mouth daily. Take one capsule by mouth daily, Disp: , Rfl:  .  hydrochlorothiazide (MICROZIDE) 12.5 MG capsule, TAKE ONE CAPSULE BY MOUTH DAILY, Disp: 90 capsule, Rfl: 3 .  isosorbide mononitrate (IMDUR) 60 MG 24 hr tablet, TAKE ONE AND ONE-HALF (1  1/2) TABLETS BY MOUTH DAILY AROUND NOON (Patient taking differently: TAKE ONE AND ONE-HALF (90mg ) TABLETS BY MOUTH DAILY AROUND NOON), Disp: 90 tablet, Rfl: 1 .  Multiple Vitamins-Minerals (MULTIVITAMIN WITH MINERALS) tablet, Take one tablet by mouth every other day  In AM, Disp: , Rfl:  .  prochlorperazine (COMPAZINE) 10 MG tablet, Take 1 tablet (10 mg total) by mouth every 6 (six) hours as needed for nausea or vomiting., Disp: 30 tablet, Rfl: 0 .  traMADol (ULTRAM) 50 MG tablet, Take 1 tablet (50 mg total) by mouth every 4 (four) hours as needed., Disp: 30 tablet, Rfl: 0 .  verapamil (CALAN-SR) 180 MG CR tablet, Take 1 tablet (180 mg total) by mouth at bedtime., Disp: 30 tablet, Rfl: 11:   Allergies: Allergies  Allergen Reactions  . Penicillins Other (See Comments)    Did not help with strep throat--Unknown reaction  Has patient had a PCN  reaction causing immediate rash, facial/tongue/throat swelling, SOB or lightheadedness with hypotension: No Has patient had a PCN reaction causing severe rash involving mucus membranes or skin necrosis: No Has patient had a PCN reaction that required hospitalization: No Has patient had a PCN reaction occurring within the last 10 years: No If all of the above answers are "NO", then may proceed with Cephalosporin use.    :     REVIEW OF SYSTEMS:  On review of systems, the patient reports that he is  doing well overall. He denies any chest pain, shortness of breath, cough, fevers, chills, or night sweats. He has had fatigue and about 6 pounds of unintended weight change that he attributes to poor appetite. He reports some bowel irregularities that he maintains with Miralax. He also has intermittent nausea but denies abdominal pain or vomiting. He complains of dysuria, urinary retention, weak stream, and hematuria. He also reports intermittent low left back pain since bilateral stent placement that is managed with Tylenol or Tramadol. He denies any new skin lesions or concerns. A complete review of systems is obtained and is otherwise negative.     PHYSICAL EXAM:  Vitals:   07/20/17 1253  BP: 120/90  Pulse: (!) 101  Resp: 18  SpO2: 100%  Weight: 172 lb 3.2 oz (78.1 kg)  Height: 5\' 10"  (1.778 m)   In general this is a well appearing Caucasian man in no acute distress. He is alert and oriented x4 and appropriate throughout the examination. HEENT reveals that the patient is normocephalic, atraumatic. Cardiovascular exam reveals a regular rate and rhythm, no clicks rubs or murmurs are auscultated. Chest is clear to auscultation bilaterally. Lymphatic assessment is performed and does not reveal any adenopathy in the cervical, supraclavicular, axillary, or inguinal chains. Abdomen is intact. The abdomen is soft, non tender, non distended.   KPS = 90  100 - Normal; no complaints; no evidence of  disease. 90   - Able to carry on normal activity; minor signs or symptoms of disease. 80   - Normal activity with effort; some signs or symptoms of disease. 49   - Cares for self; unable to carry on normal activity or to do active work. 60   - Requires occasional assistance, but is able to care for most of his personal needs. 50   - Requires considerable assistance and frequent medical care. 6   - Disabled; requires special care and assistance. 109   - Severely disabled; hospital admission is indicated although death not imminent. 74   - Very sick; hospital admission necessary; active supportive treatment necessary. 10   - Moribund; fatal processes progressing rapidly. 0     - Dead  Karnofsky DA, Abelmann [place], Craver LS and Burchenal [facility] [phone]) The use of the nitrogen mustards in the palliative treatment of carcinoma: with particular reference to bronchogenic carcinoma Cancer 1 634-56  LABORATORY DATA:  Lab Results  Component Value Date   WBC 9.0 06/29/2017   HGB 12.4 (L) 06/29/2017   HCT 34.4 (L) 06/29/2017   MCV 91.0 06/29/2017   PLT 191 06/29/2017   Lab Results  Component Value Date   NA 141 06/29/2017   K 3.3 (L) 06/29/2017   CL 103 06/29/2017   CO2 28 06/29/2017   Lab Results  Component Value Date   ALT 17 02/27/2017   AST 23 02/27/2017   ALKPHOS 65 02/27/2017   BILITOT 0.9 02/27/2017     RADIOGRAPHY: Dg C-arm 1-60 Min-no Report  Result Date: 06/29/2017 Fluoroscopy was utilized by the requesting physician.  No radiographic interpretation.      IMPRESSION: 1.  83 y.o. gentleman with high grade urothelial carcinoma of the left lateral wall of the bladder. Today, we talked to the patient and family about the findings and work-up thus far.  We discussed the natural history of high grade muscle invasive bladder cancer and general treatment, highlighting the role of radiotherapy in the management.  We discussed the available radiation techniques, and focused on the details of  logistics and delivery.  We reviewed the anticipated acute and late sequelae associated with radiation in this setting.  The patient was encouraged to ask questions that we answered to the best of our ability. The patient would like to proceed with radiation and will be scheduled for CT simulation in the near future. We anticipate about 6.5 weeks of radiotherapy to the whole urinary bladder and regional lymph nodes, in combination with chemotherapy which is scheduled to begin 08/07/17. 2. Symptoms of urinary outflow dysfunction. UA was not grossly positive for infection. We will follow up with his culture, and discussed OTC AZO as well as prescribed Flomax for management of the patient's urinary symptoms. We also discussed beginning Azo for dysuria relief. 3. Possible genetic predisposition to cancer. Given his mother's history, we discussed the role that genetic counseling may play in the patient's course of treatment. I will contact him after speaking with genetics.   We spent 60 minutes face to face with the patient and more than 50% of that time was spent in counseling and/or coordination of care.    Carola Rhine, PAC    Tyler Pita, MD  [facility] Direct Dial: [phone]  Fax: [phone] [email or web address]  Skype  LinkedIn   This document serves as a record of services personally performed by Tyler Pita, MD and Shona Simpson, PA-C. It was created on their behalf by Rae Lips, a trained medical scribe. The creation of this record is based on the scribe's personal observations and the providers' statements to them. This document has been checked and approved by the attending providers.

## 2017-07-21 ENCOUNTER — Ambulatory Visit: Payer: Medicare HMO | Admitting: Oncology

## 2017-07-21 ENCOUNTER — Telehealth: Payer: Self-pay | Admitting: Radiation Oncology

## 2017-07-21 NOTE — Telephone Encounter (Signed)
Phoned patient as requested by Shona Simpson, PA-C. Explained urinalysis was not indicative of infection but, we will wait for the culture results to confirm. Patient verbalized understanding.

## 2017-07-21 NOTE — Telephone Encounter (Signed)
Phoned patient a second time as requested by Shona Simpson, PA-C. Inquired if he and his daughter are interest in genetics counseling. Patient uncertain. Patient took my contact information and plans to discuss this with his wife and daughters then, call this RN back.

## 2017-07-21 NOTE — Telephone Encounter (Signed)
-----   Message from Hayden Pedro, [place] sent at 07/21/2017 12:51 PM EDT ----- Will you check with patient to see if he and daughters want to meet with genetics? Santiago Glad said it was worth discussing, but not a slam dunk

## 2017-07-22 ENCOUNTER — Telehealth: Payer: Self-pay | Admitting: Radiation Oncology

## 2017-07-22 LAB — URINE CULTURE

## 2017-07-22 NOTE — Telephone Encounter (Signed)
Per Shona Simpson, PA-C request I phoned patient with normal urine culture results. Also, per Bryson Ha I instructed the patient to continue Flomax and AZO. Patient denies having an opportunity to speak with his daughters about genetic counseling.  Patient verbalized understanding and expressed appreciation for the third call.

## 2017-07-22 NOTE — Addendum Note (Signed)
Addendum  created 07/22/17 1126 by Belvin Gauss, MD   Sign clinical note    

## 2017-07-23 ENCOUNTER — Telehealth: Payer: Self-pay | Admitting: Cardiovascular Disease

## 2017-07-23 ENCOUNTER — Other Ambulatory Visit: Payer: Self-pay | Admitting: Cardiology

## 2017-07-23 ENCOUNTER — Telehealth: Payer: Self-pay | Admitting: Family Medicine

## 2017-07-23 NOTE — Telephone Encounter (Signed)
Spoke with pt, for the last week to 10 days he has noticed SOB. He is on a new medication for cancer and will be starting chemo and radiation soon. He reports swelling in his feet, legs and abdomen. He also reports trouble breathing when lying flat and has to sit up to sleep. He reports weakness and no appetite. He has not been checking his bp. He has a follow up with dr [name] in October. He was made an appointment with the APP next week. He understands to go to the ER if symptoms get worse.

## 2017-07-23 NOTE — Telephone Encounter (Signed)
Pt c/o Shortness Of Breath: STAT if SOB developed within the last 24 hours or pt is noticeably SOB on the phone  1. Are you currently SOB (can you hear that pt is SOB on the phone)? No   2. How long have you been experiencing SOB? Couple of weeks  3. Are you SOB when sitting or when up moving around? both  4. Are you currently experiencing any other symptoms? No appetite

## 2017-07-23 NOTE — Telephone Encounter (Signed)
Patient called to advise that he is following Dr. Yong Channel over to [facility] when he transfers. I advised the patient that Dr. Yong Channel will not be at the office until 08/31/17 and that he can call [facility] for any appointments up until then and that Dr. Yong Channel is still listed as his PCP. No further action required.

## 2017-07-24 DIAGNOSIS — R310 Gross hematuria: Secondary | ICD-10-CM | POA: Diagnosis not present

## 2017-07-24 DIAGNOSIS — C672 Malignant neoplasm of lateral wall of bladder: Secondary | ICD-10-CM | POA: Diagnosis not present

## 2017-07-25 ENCOUNTER — Inpatient Hospital Stay (HOSPITAL_COMMUNITY)
Admission: EM | Admit: 2017-07-25 | Discharge: 2017-07-29 | DRG: 291 | Disposition: A | Discharge status: Home Health | Payer: Medicare HMO | Attending: Internal Medicine | Admitting: Internal Medicine

## 2017-07-25 ENCOUNTER — Encounter (HOSPITAL_COMMUNITY): Payer: Self-pay | Admitting: Emergency Medicine

## 2017-07-25 ENCOUNTER — Emergency Department (HOSPITAL_COMMUNITY): Payer: Medicare HMO

## 2017-07-25 ENCOUNTER — Other Ambulatory Visit: Payer: Self-pay

## 2017-07-25 DIAGNOSIS — I351 Nonrheumatic aortic (valve) insufficiency: Secondary | ICD-10-CM | POA: Diagnosis not present

## 2017-07-25 DIAGNOSIS — J189 Pneumonia, unspecified organism: Secondary | ICD-10-CM | POA: Diagnosis present

## 2017-07-25 DIAGNOSIS — Z96612 Presence of left artificial shoulder joint: Secondary | ICD-10-CM | POA: Diagnosis present

## 2017-07-25 DIAGNOSIS — M1990 Unspecified osteoarthritis, unspecified site: Secondary | ICD-10-CM | POA: Diagnosis present

## 2017-07-25 DIAGNOSIS — Z96652 Presence of left artificial knee joint: Secondary | ICD-10-CM | POA: Diagnosis present

## 2017-07-25 DIAGNOSIS — R0902 Hypoxemia: Secondary | ICD-10-CM

## 2017-07-25 DIAGNOSIS — I110 Hypertensive heart disease with heart failure: Secondary | ICD-10-CM | POA: Diagnosis not present

## 2017-07-25 DIAGNOSIS — I10 Essential (primary) hypertension: Secondary | ICD-10-CM | POA: Diagnosis not present

## 2017-07-25 DIAGNOSIS — G4733 Obstructive sleep apnea (adult) (pediatric): Secondary | ICD-10-CM | POA: Diagnosis present

## 2017-07-25 DIAGNOSIS — J9601 Acute respiratory failure with hypoxia: Secondary | ICD-10-CM | POA: Diagnosis not present

## 2017-07-25 DIAGNOSIS — Z880 Allergy status to penicillin: Secondary | ICD-10-CM

## 2017-07-25 DIAGNOSIS — Z955 Presence of coronary angioplasty implant and graft: Secondary | ICD-10-CM | POA: Diagnosis not present

## 2017-07-25 DIAGNOSIS — K5900 Constipation, unspecified: Secondary | ICD-10-CM | POA: Diagnosis present

## 2017-07-25 DIAGNOSIS — I5033 Acute on chronic diastolic (congestive) heart failure: Secondary | ICD-10-CM | POA: Diagnosis present

## 2017-07-25 DIAGNOSIS — C679 Malignant neoplasm of bladder, unspecified: Secondary | ICD-10-CM | POA: Diagnosis present

## 2017-07-25 DIAGNOSIS — Z8572 Personal history of non-Hodgkin lymphomas: Secondary | ICD-10-CM

## 2017-07-25 DIAGNOSIS — I6523 Occlusion and stenosis of bilateral carotid arteries: Secondary | ICD-10-CM | POA: Diagnosis not present

## 2017-07-25 DIAGNOSIS — Z7982 Long term (current) use of aspirin: Secondary | ICD-10-CM

## 2017-07-25 DIAGNOSIS — Z8673 Personal history of transient ischemic attack (TIA), and cerebral infarction without residual deficits: Secondary | ICD-10-CM

## 2017-07-25 DIAGNOSIS — Z960 Presence of urogenital implants: Secondary | ICD-10-CM | POA: Diagnosis present

## 2017-07-25 DIAGNOSIS — D696 Thrombocytopenia, unspecified: Secondary | ICD-10-CM | POA: Diagnosis present

## 2017-07-25 DIAGNOSIS — R0602 Shortness of breath: Secondary | ICD-10-CM

## 2017-07-25 DIAGNOSIS — I5032 Chronic diastolic (congestive) heart failure: Secondary | ICD-10-CM | POA: Diagnosis not present

## 2017-07-25 DIAGNOSIS — Z87891 Personal history of nicotine dependence: Secondary | ICD-10-CM | POA: Diagnosis not present

## 2017-07-25 DIAGNOSIS — Z79899 Other long term (current) drug therapy: Secondary | ICD-10-CM | POA: Diagnosis not present

## 2017-07-25 DIAGNOSIS — J156 Pneumonia due to other aerobic Gram-negative bacteria: Secondary | ICD-10-CM

## 2017-07-25 DIAGNOSIS — Z888 Allergy status to other drugs, medicaments and biological substances status: Secondary | ICD-10-CM | POA: Diagnosis not present

## 2017-07-25 DIAGNOSIS — I2510 Atherosclerotic heart disease of native coronary artery without angina pectoris: Secondary | ICD-10-CM | POA: Diagnosis not present

## 2017-07-25 DIAGNOSIS — E785 Hyperlipidemia, unspecified: Secondary | ICD-10-CM | POA: Diagnosis not present

## 2017-07-25 DIAGNOSIS — N400 Enlarged prostate without lower urinary tract symptoms: Secondary | ICD-10-CM | POA: Diagnosis not present

## 2017-07-25 DIAGNOSIS — Z9119 Patient's noncompliance with other medical treatment and regimen: Secondary | ICD-10-CM

## 2017-07-25 DIAGNOSIS — J181 Lobar pneumonia, unspecified organism: Secondary | ICD-10-CM | POA: Diagnosis not present

## 2017-07-25 LAB — BASIC METABOLIC PANEL
Anion gap: 14 (ref 5–15)
BUN: 27 mg/dL — ABNORMAL HIGH (ref 6–20)
CALCIUM: 9.3 mg/dL (ref 8.9–10.3)
CHLORIDE: 99 mmol/L — AB (ref 101–111)
CO2: 24 mmol/L (ref 22–32)
CREATININE: 1.1 mg/dL (ref 0.61–1.24)
GFR calc non Af Amer: 57 mL/min — ABNORMAL LOW (ref >60)
Glucose, Bld: 112 mg/dL — ABNORMAL HIGH (ref 65–99)
POTASSIUM: 3.8 mmol/L (ref 3.5–5.1)
Sodium: 137 mmol/L (ref 135–145)

## 2017-07-25 LAB — CBC
HCT: 37.9 % — ABNORMAL LOW (ref 39.0–52.0)
Hemoglobin: 13.2 g/dL (ref 13.0–17.0)
MCH: 32.5 pg (ref 26.0–34.0)
MCHC: 34.8 g/dL (ref 30.0–36.0)
MCV: 93.3 fL (ref 78.0–100.0)
PLATELETS: 287 10*3/uL (ref 150–400)
RBC: 4.06 MIL/uL — AB (ref 4.22–5.81)
RDW: 13.9 % (ref 11.5–15.5)
WBC: 10.9 10*3/uL — AB (ref 4.0–10.5)

## 2017-07-25 LAB — POCT I-STAT TROPONIN I: Troponin i, poc: 0.02 ng/mL (ref 0.00–0.08)

## 2017-07-25 LAB — BRAIN NATRIURETIC PEPTIDE: B NATRIURETIC PEPTIDE 5: 140.5 pg/mL — AB (ref 0.0–100.0)

## 2017-07-25 MED ORDER — ATORVASTATIN CALCIUM 20 MG PO TABS
20.0000 mg | ORAL_TABLET | Freq: Every day | ORAL | Status: DC
  Administered 2017-07-25 – 2017-07-28 (×4): 20 mg via ORAL
  Filled 2017-07-25 (×4): qty 1

## 2017-07-25 MED ORDER — ISOSORBIDE MONONITRATE ER 60 MG PO TB24
60.0000 mg | ORAL_TABLET | Freq: Every day | ORAL | Status: DC
  Administered 2017-07-25 – 2017-07-26 (×2): 60 mg via ORAL
  Filled 2017-07-25 (×2): qty 1

## 2017-07-25 MED ORDER — DEXTROSE 5 % IV SOLN
500.0000 mg | INTRAVENOUS | Status: DC
  Administered 2017-07-26 – 2017-07-27 (×2): 500 mg via INTRAVENOUS
  Filled 2017-07-25 (×3): qty 500

## 2017-07-25 MED ORDER — ENSURE ENLIVE PO LIQD
237.0000 mL | Freq: Two times a day (BID) | ORAL | Status: DC
  Administered 2017-07-26 – 2017-07-29 (×6): 237 mL via ORAL

## 2017-07-25 MED ORDER — VITAMIN C 500 MG PO TABS
500.0000 mg | ORAL_TABLET | Freq: Every day | ORAL | Status: DC
Start: 2017-07-26 — End: 2017-07-29
  Administered 2017-07-26 – 2017-07-29 (×3): 500 mg via ORAL
  Filled 2017-07-25 (×4): qty 1

## 2017-07-25 MED ORDER — OMEGA-3 FATTY ACIDS 1000 MG PO CAPS: 1.0000 g | ORAL_CAPSULE | Freq: Every day | ORAL | Status: DC

## 2017-07-25 MED ORDER — ENOXAPARIN SODIUM 30 MG/0.3ML ~~LOC~~ SOLN
30.0000 mg | SUBCUTANEOUS | Status: DC
  Administered 2017-07-25 – 2017-07-26 (×2): 30 mg via SUBCUTANEOUS
  Filled 2017-07-25 (×2): qty 0.3

## 2017-07-25 MED ORDER — TAMSULOSIN HCL 0.4 MG PO CAPS
0.4000 mg | ORAL_CAPSULE | Freq: Every day | ORAL | Status: DC
  Administered 2017-07-25 – 2017-07-28 (×4): 0.4 mg via ORAL
  Filled 2017-07-25 (×4): qty 1

## 2017-07-25 MED ORDER — ASPIRIN EC 81 MG PO TBEC
162.5000 mg | DELAYED_RELEASE_TABLET | Freq: Every evening | ORAL | Status: DC
  Administered 2017-07-25 – 2017-07-28 (×4): 162.5 mg via ORAL
  Filled 2017-07-25 (×4): qty 3

## 2017-07-25 MED ORDER — DEXTROSE 5 % IV SOLN
1.0000 g | INTRAVENOUS | Status: DC
  Administered 2017-07-26 – 2017-07-28 (×3): 1 g via INTRAVENOUS
  Filled 2017-07-25 (×5): qty 10

## 2017-07-25 MED ORDER — DEXTROSE 5 % IV SOLN
500.0000 mg | Freq: Once | INTRAVENOUS | Status: AC
  Administered 2017-07-25: 500 mg via INTRAVENOUS
  Filled 2017-07-25: qty 500

## 2017-07-25 MED ORDER — ADULT MULTIVITAMIN W/MINERALS CH
1.0000 | ORAL_TABLET | Freq: Every day | ORAL | Status: DC
  Administered 2017-07-26 – 2017-07-29 (×4): 1 via ORAL
  Filled 2017-07-25 (×5): qty 1

## 2017-07-25 MED ORDER — HYDROCHLOROTHIAZIDE 12.5 MG PO CAPS: 12.5000 mg | ORAL_CAPSULE | Freq: Every day | ORAL | Status: DC

## 2017-07-25 MED ORDER — VERAPAMIL HCL ER 180 MG PO TBCR
180.0000 mg | EXTENDED_RELEASE_TABLET | Freq: Every day | ORAL | Status: DC
  Administered 2017-07-25: 180 mg via ORAL
  Filled 2017-07-25: qty 1

## 2017-07-25 MED ORDER — OMEGA-3-ACID ETHYL ESTERS 1 G PO CAPS
1.0000 g | ORAL_CAPSULE | Freq: Every day | ORAL | Status: DC
  Filled 2017-07-25 (×4): qty 1

## 2017-07-25 MED ORDER — DEXTROSE 5 % IV SOLN
1.0000 g | Freq: Once | INTRAVENOUS | Status: AC
  Administered 2017-07-25: 1 g via INTRAVENOUS
  Filled 2017-07-25: qty 10

## 2017-07-25 MED ORDER — ATENOLOL 25 MG PO TABS
25.0000 mg | ORAL_TABLET | Freq: Every day | ORAL | Status: DC
  Administered 2017-07-25: 25 mg via ORAL
  Filled 2017-07-25: qty 1

## 2017-07-25 MED ORDER — ACETAMINOPHEN 325 MG PO TABS
325.0000 mg | ORAL_TABLET | Freq: Two times a day (BID) | ORAL | Status: DC
Start: 2017-07-25 — End: 2017-07-29
  Administered 2017-07-25 – 2017-07-29 (×8): 325 mg via ORAL
  Filled 2017-07-25 (×8): qty 1

## 2017-07-25 NOTE — ED Notes (Signed)
EKG completed in triage. [employer]

## 2017-07-25 NOTE — ED Triage Notes (Signed)
Pt reports he has had SOB for the past 2 weeks that has gradually gotten worse over the past few days. Pt reports he feels that he has to breathe deeper to get a full breath. Pt denies CP or dizziness. No recent travel. Pt does feel lightheadedness. Pt has hx of bladder ca, not on chemo or radiation. Recently had kidney stent placed, but denies flank pain.

## 2017-07-25 NOTE — H&P (Signed)
History and Physical    Curley Hogen GYI:[id number] DOB: 1924/09/18 DOA: 07/25/2017  PCP: [name], MD Patient coming from: home  Chief Complaint: cant breathe  HPI: Mariano Doshi is a 83 y.o. male with medical history significant of coronary artery disease history of stent in 2003 by Dr. Shelva Majestic, history of non-Hodgkin's lymphoma, recent diagnosis of cancer of the bladder urinary bladder followed by Dr. Tammi Klippel admitted with shortness of breath for the last 2 weeks. Patient came to the ER today because the breathing difficulty has been progressively getting worse. He reports that he has no appetite he is feeling weak he has complaints of generalized weakness. He has lost some weight because of this reason he doesn't know how much weight he has lost. He lives with his wife at home. He also reports that he is on a new medication for bladder cancer looking back into his records it looks like he is on carboplatin. And he is due to get chemotherapy and radiation. She he denies any chest pain cough fever chills headaches changes with his vision nausea vomiting or diarrhea. He does report some constipation. Patient does have a history of smoking for 20 years from the age of 89 the age of 82. Patient does complain of orthopnea and dyspnea on exertion. Patient is not able to lay flat because of breathing difficulty so he has been sleeping with his head elevated. His oxygen saturation was 94% on room air in the ER however he sees when they placed oxygen on him he felt better.    ED Course: Chest x-ray done in the emergency room showed small bilateral pleural effusion left greater than right and mild haziness in the right upper lobe possibly pneumonia. His white count was 10.9 with a hemoglobin of 13.2 platelet count is 287 sodium 137 potassium 3.8 BUN 27 creatinine is 1.10 and his bnp was 140.5.  Review of Systems: As per HPI otherwise all other systems reviewed and are negative  Ambulatory  Status  Past Medical History:  Diagnosis Date  . Allergic rhinitis   . Bilateral carotid artery stenosis    per last duplex [id number]  RICA 82-99%,  LICA 3-71%  . Bladder cancer [facility]) urologist-  dr Alyson Ingles   Non-invasise High Grade  . Chronic cough    ? lisinopril intolerence  . Coronary artery disease cardiologiat-  dr Shelva Majestic   hx PCI and stenting 2003/  per cardiac cath total occlusion RCA w/ collaterals 1995  . Diverticulosis of colon   . History of adenomatous polyp of colon    tubular adenoma's 1992; 1993; 1997; 2007  . History of colonic polyps   . History of nonmelanoma skin cancer    BCC and SCC right side nose  s/p moh's reseciton and reconstruction 2013/  left ear  01/ 2017/   06/ 2017 in office excision 2 areas on scalp  . History of TIAs takes ASA   pt states has a few what he thinks ia tia's, states last time with symptoms of veritgo and thought process was 04/ 2017  . HTN (hypertension)   . Hypercholesterolemia   . Non-Hodgkin lymphoma, unspecified, unspecified site [facility]) oncologist-  dr Benay Spice--  pt in clinical remission since 2000   dx 1996  no specific therapy since 2000  . OA (osteoarthritis)   . OSA (obstructive sleep apnea)    moderate per study 08-10-2013--  intolerant cpap  . Prostate nodule   . Thrombocytopenia (HCC)    chronic  mild  . Wears glasses     Past Surgical History:  Procedure Laterality Date  . CARDIOVASCULAR STRESS TEST  07-25-2016 dr Shelva Majestic   Intermediate risk  nuclear study w/ a medium defect of moderate severity present in the basal inferolateral, mid inferlateral , and apical inferior location-- Findings consistent w/ ischemia and prior MI w/ per-infarct ischemia-- Per Dr [name] mild inferior ischemia; similar defect in 2014 study but less scar on present study:  Nuclear stress EF 52%  . CATARACT EXTRACTION W/ INTRAOCULAR LENS  IMPLANT, BILATERAL  2013  . CORONARY ANGIOPLASTY  1995   total occlusion RCA w/ collaterals;   PTCA 2nd marginal LCFX  . CORONARY ANGIOPLASTY WITH STENT PLACEMENT  01-05-2002  dr Shelva Majestic   Complex high-speed rotational atherectomy of entire LAD multiple sites and stenting proximal and mid LAD/  no re-stenosis in prior CFX marginal dilatation site and RCA occluded w/ left to right collaterals,  ef 57%  . CYSTOSCOPY W/ RETROGRADES Bilateral 12/22/2016   Procedure: CYSTOSCOPY WITH RETROGRADE PYELOGRAM;  Surgeon: Cleon Gustin, MD;  Location: [facility];  Service: Urology;  Laterality: Bilateral;  . CYSTOSCOPY W/ URETERAL STENT PLACEMENT Bilateral 06/29/2017   Procedure: CYSTOSCOPY WITH RETROGRADE PYELOGRAM/URETERAL BILATERAL STENT PLACEMENT;  Surgeon: Cleon Gustin, MD;  Location: [facility];  Service: Urology;  Laterality: Bilateral;  . CYSTOSCOPY WITH BIOPSY N/A 06/29/2017   Procedure: CYSTOSCOPY WITH BIOPSY;  Surgeon: Cleon Gustin, MD;  Location: [facility];  Service: Urology;  Laterality: N/A;  . CYSTOSCOPY/RETROGRADE/URETEROSCOPY/STONE EXTRACTION WITH BASKET Bilateral 05/05/2016   Procedure: CYSTOSCOPY WITH RETROGRADE PYELOGRAM;  Surgeon: Cleon Gustin, MD;  Location: [facility];  Service: Urology;  Laterality: Bilateral;  . MOHS SURGERY  2013   at [facility]   nose -- w/ Reconstructive surgery same year  . MOHS SURGERY  01/ 2017   left ear  . REFRACTIVE SURGERY Left 05/ 2017  . TONSILLECTOMY  age 47  . TOTAL KNEE ARTHROPLASTY Left 03-07-2002  . TOTAL SHOULDER ARTHROPLASTY Left 04-30-2005  . TRANSTHORACIC ECHOCARDIOGRAM  04-29-2012   borderline inferior wall hypokinesis, ef 65%/  mild dilated RV/  mild to moderate AV calcification without stenosis/  mild AR, TR, and  MR/  moderate PR/  mild aortic root dilatation  . TRANSURETHRAL RESECTION OF BLADDER TUMOR N/A 12/22/2016   Procedure: TRANSURETHRAL RESECTION OF BLADDER TUMOR (TURBT);  Surgeon: Cleon Gustin, MD;  Location: [facility];  Service: Urology;  Laterality: N/A;  . TRANSURETHRAL  RESECTION OF BLADDER TUMOR WITH GYRUS (TURBT-GYRUS) N/A 05/05/2016   Procedure: TRANSURETHRAL RESECTION OF BLADDER TUMOR WITH GYRUS (TURBT-GYRUS);  Surgeon: Cleon Gustin, MD;  Location: [facility];  Service: Urology;  Laterality: N/A;  . TRANSURETHRAL RESECTION OF BLADDER TUMOR WITH GYRUS (TURBT-GYRUS) N/A 06/09/2016   Procedure: TRANSURETHRAL RESECTION OF BLADDER TUMOR WITH GYRUS (TURBT-GYRUS);  Surgeon: Cleon Gustin, MD;  Location[facility];  Service: Urology;  Laterality: N/A;    Social History   Social History  . Marital status: Married    Spouse name: Statistician  . Number of children: 3  . Years of education: N/A   Occupational History  . retired-sales Chief Financial Officer    Social History Main Topics  . Smoking status: Former Smoker    Packs/day: 2.00    Years: 20.00    Types: Cigarettes    Quit date: 12/01/1966  . Smokeless tobacco: Never Used  . Alcohol use No  . Drug use: No  . Sexual  activity: No   Other Topics Concern  . Not on file   Social History Narrative   Married 61 years in 2015. 3 kids. 5 grandkids.       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 Penicillins Other (See Comments)    Did not help with strep throat--Unknown reaction  Has patient had a PCN reaction causing immediate rash, facial/tongue/throat swelling, SOB or lightheadedness with hypotension: No Has patient had a PCN reaction causing severe rash involving mucus membranes or skin necrosis: No Has patient had a PCN reaction that required hospitalization: No Has patient had a PCN reaction occurring within the last 10 years: No If all of the above answers are "NO", then may proceed with Cephalosporin use.      Family History   Problem Relation Age of Onset  . Pancreatic cancer Mother   . Heart disease Father       Prior to Admission medications   Medication Sig Start Date End Date Taking? Authorizing Provider  acetaminophen (TYLENOL) 500 MG tablet Take 250 mg by mouth 2 (two) times daily.   Yes [provider]  Ascorbic Acid (VITAMIN C) 500 MG tablet Take 500 mg by mouth daily.    Yes [provider]  aspirin EC 325 MG tablet Take 162.5 mg by mouth every evening.    Yes [provider]  atenolol (TENORMIN) 50 MG tablet TAKE 1/2 TABLET (25 MG TOTAL) BY MOUTH 2 (TWO) TIMES DAILY. 12/25/16  Yes Troy Sine, MD  atorvastatin (LIPITOR) 20 MG tablet TAKE 1 TABLET (20 MG TOTAL) BY MOUTH DAILY. Patient taking differently: TAKE 1 TABLET (20 MG TOTAL) BY MOUTH DAILY AT NIGHT 04/02/17  Yes Troy Sine, MD  fish oil-omega-3 fatty acids 1000 MG capsule Take 1 g by mouth daily. Take one capsule by mouth daily   Yes [provider]  hydrochlorothiazide (MICROZIDE) 12.5 MG capsule TAKE ONE CAPSULE BY MOUTH DAILY 04/02/17  Yes Troy Sine, MD  isosorbide mononitrate (IMDUR) 60 MG 24 hr tablet TAKE ONE AND ONE-HALF TABLET BY MOUTH DAILY AROUND NOON 07/23/17  Yes Isaiah Serge, NP  Multiple Vitamins-Minerals (MULTIVITAMIN WITH MINERALS) tablet Take one tablet by mouth every other day  In AM   Yes [provider]  prochlorperazine (COMPAZINE) 10 MG tablet Take 1 tablet (10 mg total) by mouth every 6 (six) hours as needed for nausea or vomiting. 07/15/17  Yes Wyatt Portela, MD  tamsulosin (FLOMAX) 0.4 MG CAPS capsule Take 1 capsule (0.4 mg total) by mouth daily after supper. 07/20/17  Yes Hayden Pedro, PA-C  verapamil (CALAN-SR) 180 MG CR tablet Take 1 tablet (180 mg total) by mouth at bedtime. 02/16/17  Yes Troy Sine, MD  Azelastine-Fluticasone 137-50 MCG/ACT SUSP Place 1 spray into the nose daily as needed. Patient not taking: Reported on 07/25/2017 01/23/17   [name], MD  traMADol (ULTRAM) 50 MG tablet Take 1 tablet (50 mg total) by mouth every 4 (four) hours as needed. Patient not taking: Reported on 07/25/2017 06/29/17 06/29/18  Cleon Gustin, MD    Physical Exam: Vitals:   07/25/17 1330 07/25/17 1430 07/25/17 1431 07/25/17 1530  BP:  115/70 119/79 119/79 121/72  Pulse: 67 71 70 70  Resp:  (!) 23 18 (!) 22  Temp:   97.9 F (36.6 C)   TempSrc:   Oral   SpO2: 98% 98% 100% 99%     General:  Appears calm and comfortable Eyes:  PERRL, EOMI, normal lids, iris ENT:  grossly normal hearing, lips & tongue, mmm Neck: no LAD, masses or thyromegaly Cardiovascular:  RRR, no m/r/g. No LE edema.  Respiratory: decreased breath sounds at the bases bl.no wheezing. Abdomen:  soft, ntnd, NABS Skin:  no rash or induration seen on limited exam Musculoskeletal: grossly normal tone BUE/BLE, good ROM, no bony abnormality Psychiatric:  grossly normal mood and affect, speech fluent and appropriate, AOx3 Neurologic: CN 2-12 grossly intact, moves all extremities in coordinated fashion, sensation intact  Labs on Admission: I have personally reviewed following labs and imaging studies  CBC:  Recent Labs Lab 07/25/17 1000  WBC 10.9*  HGB 13.2  HCT 37.9*  MCV 93.3  PLT 009   Basic Metabolic Panel:  Recent Labs Lab 07/25/17 1000  NA 137  K 3.8  CL 99*  CO2 24  GLUCOSE 112*  BUN 27*  CREATININE 1.10  CALCIUM 9.3   GFR: Estimated Creatinine Clearance: 46.1 mL/min (by C-G formula based on SCr of 1.1 mg/dL). Liver Function Tests: No results for input(s): AST, ALT, ALKPHOS, BILITOT, PROT, ALBUMIN in the last 168 hours. No results for input(s): LIPASE, AMYLASE in the last 168 hours. No results for input(s): AMMONIA in the last 168 hours. Coagulation Profile: No results for input(s): INR, PROTIME in the last 168 hours. Cardiac Enzymes: No results for input(s): CKTOTAL, CKMB, CKMBINDEX, TROPONINI in the last 168 hours. BNP (last 3  results) No results for input(s): PROBNP in the last 8760 hours. HbA1C: No results for input(s): HGBA1C in the last 72 hours. CBG: No results for input(s): GLUCAP in the last 168 hours. Lipid Profile: No results for input(s): CHOL, HDL, LDLCALC, TRIG, CHOLHDL, LDLDIRECT in the last 72 hours. Thyroid Function Tests: No results for input(s): TSH, T4TOTAL, FREET4, T3FREE, THYROIDAB in the last 72 hours. Anemia Panel: No results for input(s): VITAMINB12, FOLATE, FERRITIN, TIBC, IRON, RETICCTPCT in the last 72 hours. Urine analysis:    Component Value Date/Time   LABSPEC 1.030 07/20/2017 1427   PHURINE 6.0 07/20/2017 1427   GLUCOSEU Negative 07/20/2017 1427   HGBUR Large 07/20/2017 1427   BILIRUBINUR Negative 07/20/2017 1427   KETONESUR Negative 07/20/2017 1427   PROTEINUR 300 07/20/2017 1427   UROBILINOGEN 0.2 07/20/2017 1427   NITRITE Negative 07/20/2017 1427   LEUKOCYTESUR Moderate 07/20/2017 1427    Creatinine Clearance: Estimated Creatinine Clearance: 46.1 mL/min (by C-G formula based on SCr of 1.1 mg/dL).  Sepsis Labs: @LABRCNTIP (procalcitonin:4,lacticidven:4) ) Recent Results (from the past 240 hour(s))  Urine culture     Status: None   Collection Time: 07/20/17  2:27 PM  Result Value Ref Range Status   Urine Culture, Routine Final report  Final   Organism ID, Bacteria Comment  Final    Comment: Culture shows less than 10,000 colony forming units of bacteria per milliliter of urine. This colony count is not generally considered to be clinically significant.      Radiological Exams on Admission: Dg Chest 2 View  Result Date: 07/25/2017 CLINICAL DATA:  Shortness of breath for 2 weeks. EXAM: CHEST  2 VIEW COMPARISON:  December 20, 2013 and December 03, 2016 FINDINGS: No pneumothorax. Increased haziness in the medial right upper  lobe was not seen previously. There are small effusions with underlying atelectasis. The cardiomediastinal silhouette is stable with a tortuous  thoracic aorta. No other acute abnormalities. IMPRESSION: 1. Small bilateral effusions, left greater than right, with underlying atelectasis. 2. Mild haziness in the right upper lobe is nonspecific but an early infiltrate is not excluded. Recommend follow-up to resolution. Electronically Signed   By: Dorise Bullion III M.D   On: 07/25/2017 09:30    EKG: Independently reviewed.   Assessment/Plan Active Problems:   Pneumonia   New onset bilateral pleural effusion left greater than right with possible right upper lobe infiltrate. Patient will be treated with azithromycin and Rocephin. This showed clearing of the effusion if this is secondary to pneumonia. This patient have concerning history for bladder cancer and non-Hodgkin's lymphoma. In addition to that he has history of CAD and stent but he has no evidence of congestive heart failure by exam or by chest x-ray and his BNP is normal. I will not give him any diuretics at this time. I will not be giving him any IV fluids though he appears slightly dry because of the bilateral pleural effusion. I have discussed with him to encourage by mouth fluids. I will order a echocardiogram. I will order a TSH studies. I will follow-up his chest x-ray in 2 days. If his effusion doesn't get better consider consulting pulmonary or INR to tap his pleural effusion for diagnostic and therapeutic purposes. He is on carboplatin from review of his records which apparently does not cause edema or pleural effusion. Follow-up labs CBC and BMP renal functions tomorrow.  HTN continue atenolol, Imdur 60 mg daily. I will hold his hydrochlorothiazide because he looks dry. Restart as needed. Continue verapamil.  BPH continue Flomax has history of cancer followed by Dr. Tammi Klippel.  Hyperlipidemia continue Lipitor.        DVT prophylaxis: LOVENOX Code Status:   Family Communication:  Disposition Plan:  Consults called:  Admission status:    Georgette Shell MD [facility]  If 7PM-7AM, please contact night-coverage [email or web address] Password [email or web address]  07/25/2017, 4:06 PM

## 2017-07-25 NOTE — ED Notes (Signed)
Dr Vanita Panda in to update pt on test results and POC

## 2017-07-25 NOTE — ED Provider Notes (Signed)
[facility] DEPT Provider Note   CSN: [id number] Arrival date & time: 07/25/17  0844     History   Chief Complaint Chief Complaint  Patient presents with  . Shortness of Breath    HPI Derrick Rivera is a 83 y.o. male.  HPI  Patient presents with dyspnea. Patient is here with his wife who assists with the history of present illness. Patient acknowledges history of multiple medical issues including ongoing evaluation for bladder cancer for which he is scheduled to start chemotherapy and radiation in one week. Over the past week or so the patient has had dyspnea, worse at night, with difficulty with full inspiration. During this time, no fever, no chills, but there is some enlargement of his abdomen, and bilateral lower extremity swelling. No confusion, no disorientation.    Past Medical History:  Diagnosis Date  . Allergic rhinitis   . Bilateral carotid artery stenosis    per last duplex [phone]  RICA 67-67%,  LICA 2-09%  . Bladder cancer [facility]) urologist-  dr Alyson Ingles   Non-invasise High Grade  . Chronic cough    ? lisinopril intolerence  . Coronary artery disease cardiologiat-  dr Shelva Majestic   hx PCI and stenting 2003/  per cardiac cath total occlusion RCA w/ collaterals 1995  . Diverticulosis of colon   . History of adenomatous polyp of colon    tubular adenoma's 1992; 1993; 1997; 2007  . History of colonic polyps   . History of nonmelanoma skin cancer    BCC and SCC right side nose  s/p moh's reseciton and reconstruction 2013/  left ear  01/ 2017/   06/ 2017 in office excision 2 areas on scalp  . History of TIAs takes ASA   pt states has a few what he thinks ia tia's, states last time with symptoms of veritgo and thought process was 04/ 2017  . HTN (hypertension)   . Hypercholesterolemia   . Non-Hodgkin lymphoma, unspecified, unspecified site [facility]) oncologist-  dr Benay Spice--  pt in clinical remission since 2000   dx 1996  no specific therapy since 2000  . OA  (osteoarthritis)   . OSA (obstructive sleep apnea)    moderate per study 08-10-2013--  intolerant cpap  . Prostate nodule   . Thrombocytopenia (HCC)    chronic mild  . Wears glasses     Patient Active Problem List   Diagnosis Date Noted  . Bladder cancer ([place]) 06/29/2017  . Prostate nodule 08/10/2015  . Obstructive sleep apnea hypopnea, moderate 09/16/2014  . Hyperlipidemia LDL goal <70 09/16/2014  . Basal cell carcinoma 07/31/2014  . Laceration 06/13/2014  . Herpes zoster 03/30/2012  . NON-HODGKIN'S LYMPHOMA 07/09/2008  . THROMBOCYTOPENIA 07/09/2008  . Allergic rhinitis 06/27/2008  . COLONIC POLYPS 06/26/2008  . Essential hypertension 06/26/2008  . Coronary artery disease 06/26/2008  . Chronic venous insufficiency 06/26/2008  . GERD 06/26/2008  . DEGENERATIVE JOINT DISEASE 06/26/2008    Past Surgical History:  Procedure Laterality Date  . CARDIOVASCULAR STRESS TEST  07-25-2016 dr Shelva Majestic   Intermediate risk  nuclear study w/ a medium defect of moderate severity present in the basal inferolateral, mid inferlateral , and apical inferior location-- Findings consistent w/ ischemia and prior MI w/ per-infarct ischemia-- Per Dr [name] mild inferior ischemia; similar defect in 2014 study but less scar on present study:  Nuclear stress EF 52%  . CATARACT EXTRACTION W/ INTRAOCULAR LENS  IMPLANT, BILATERAL  2013  . CORONARY ANGIOPLASTY  1995   total occlusion  RCA w/ collaterals;  PTCA 2nd marginal LCFX  . CORONARY ANGIOPLASTY WITH STENT PLACEMENT  01-05-2002  dr Shelva Majestic   Complex high-speed rotational atherectomy of entire LAD multiple sites and stenting proximal and mid LAD/  no re-stenosis in prior CFX marginal dilatation site and RCA occluded w/ left to right collaterals,  ef 57%  . CYSTOSCOPY W/ RETROGRADES Bilateral 12/22/2016   Procedure: CYSTOSCOPY WITH RETROGRADE PYELOGRAM;  Surgeon: Cleon Gustin, MD;  Location: [facility];  Service: Urology;   Laterality: Bilateral;  . CYSTOSCOPY W/ URETERAL STENT PLACEMENT Bilateral 06/29/2017   Procedure: CYSTOSCOPY WITH RETROGRADE PYELOGRAM/URETERAL BILATERAL STENT PLACEMENT;  Surgeon: Cleon Gustin, MD;  Location: [facility];  Service: Urology;  Laterality: Bilateral;  . CYSTOSCOPY WITH BIOPSY N/A 06/29/2017   Procedure: CYSTOSCOPY WITH BIOPSY;  Surgeon: Cleon Gustin, MD;  Location: [facility];  Service: Urology;  Laterality: N/A;  . CYSTOSCOPY/RETROGRADE/URETEROSCOPY/STONE EXTRACTION WITH BASKET Bilateral 05/05/2016   Procedure: CYSTOSCOPY WITH RETROGRADE PYELOGRAM;  Surgeon: Cleon Gustin, MD;  Location: [facility];  Service: Urology;  Laterality: Bilateral;  . MOHS SURGERY  2013   at [facility]   nose -- w/ Reconstructive surgery same year  . MOHS SURGERY  01/ 2017   left ear  . REFRACTIVE SURGERY Left 05/ 2017  . TONSILLECTOMY  age 37  . TOTAL KNEE ARTHROPLASTY Left 03-07-2002  . TOTAL SHOULDER ARTHROPLASTY Left 04-30-2005  . TRANSTHORACIC ECHOCARDIOGRAM  04-29-2012   borderline inferior wall hypokinesis, ef 65%/  mild dilated RV/  mild to moderate AV calcification without stenosis/  mild AR, TR, and  MR/  moderate PR/  mild aortic root dilatation  . TRANSURETHRAL RESECTION OF BLADDER TUMOR N/A 12/22/2016   Procedure: TRANSURETHRAL RESECTION OF BLADDER TUMOR (TURBT);  Surgeon: Cleon Gustin, MD;  Location: [facility];  Service: Urology;  Laterality: N/A;  . TRANSURETHRAL RESECTION OF BLADDER TUMOR WITH GYRUS (TURBT-GYRUS) N/A 05/05/2016   Procedure: TRANSURETHRAL RESECTION OF BLADDER TUMOR WITH GYRUS (TURBT-GYRUS);  Surgeon: Cleon Gustin, MD;  Location: [facility];  Service: Urology;  Laterality: N/A;  . TRANSURETHRAL RESECTION OF BLADDER TUMOR WITH GYRUS (TURBT-GYRUS) N/A 06/09/2016   Procedure: TRANSURETHRAL RESECTION OF BLADDER TUMOR WITH GYRUS (TURBT-GYRUS);  Surgeon: Cleon Gustin, MD;  Location: [facility];  Service: Urology;  Laterality: N/A;        Home Medications    Prior to Admission medications   Medication Sig Start Date End Date Taking? Authorizing Provider  Ascorbic Acid (VITAMIN C) 500 MG tablet Take 500 mg by mouth daily.     [provider]  aspirin EC 325 MG tablet Take 162.5 mg by mouth 2 (two) times daily.    [provider]  atenolol (TENORMIN) 50 MG tablet TAKE 1/2 TABLET (25 MG TOTAL) BY MOUTH 2 (TWO) TIMES DAILY. 12/25/16   Troy Sine, MD  atorvastatin (LIPITOR) 20 MG tablet TAKE 1 TABLET (20 MG TOTAL) BY MOUTH DAILY. Patient taking differently: TAKE 1 TABLET (20 MG TOTAL) BY MOUTH DAILY AT NIGHT 04/02/17   Troy Sine, MD  Azelastine-Fluticasone 137-50 MCG/ACT SUSP Place 1 spray into the nose daily as needed. Patient taking differently: Place 1 spray into the nose daily as needed (congestion).  01/23/17   [name], MD  B Complex-C-Calcium (B-COMPLEX/VITAMIN C) TABS Take one tablet by mouth every 3 days  In AM    [provider]  docusate sodium (COLACE) 100 MG capsule Take 200 mg by mouth  daily as needed for mild constipation.    [provider]  fish oil-omega-3 fatty acids 1000 MG capsule Take 1 g by mouth daily. Take one capsule by mouth daily    [provider]  hydrochlorothiazide (MICROZIDE) 12.5 MG capsule TAKE ONE CAPSULE BY MOUTH DAILY 04/02/17   Troy Sine, MD  isosorbide mononitrate (IMDUR) 60 MG 24 hr tablet TAKE ONE AND ONE-HALF TABLET BY MOUTH DAILY AROUND NOON 07/23/17   Isaiah Serge, NP  Multiple Vitamins-Minerals (MULTIVITAMIN WITH MINERALS) tablet Take one tablet by mouth every other day  In AM    [provider]  prochlorperazine (COMPAZINE) 10 MG tablet Take 1 tablet (10 mg total) by mouth every 6 (six) hours as needed for nausea or vomiting. 07/15/17   Wyatt Portela, MD  tamsulosin (FLOMAX) 0.4 MG CAPS capsule Take 1 capsule (0.4 mg total) by mouth daily after supper. 07/20/17   Hayden Pedro, PA-C  traMADol  (ULTRAM) 50 MG tablet Take 1 tablet (50 mg total) by mouth every 4 (four) hours as needed. 06/29/17 06/29/18  Cleon Gustin, MD  verapamil (CALAN-SR) 180 MG CR tablet Take 1 tablet (180 mg total) by mouth at bedtime. 02/16/17   Troy Sine, MD    Family History Family History  Problem Relation Age of Onset  . Pancreatic cancer Mother   . Heart disease Father     Social History Social History  Substance Use Topics  . Smoking status: Former Smoker    Packs/day: 2.00    Years: 20.00    Types: Cigarettes    Quit date: 12/01/1966  . Smokeless tobacco: Never Used  . Alcohol use No     Allergies   Penicillins   Review of Systems Review of Systems  Constitutional:       Per HPI, otherwise negative  HENT:       Per HPI, otherwise negative  Respiratory:       Per HPI, otherwise negative  Cardiovascular:       Per HPI, otherwise negative  Gastrointestinal: Negative for vomiting.  Endocrine:       Negative aside from HPI  Genitourinary:       Neg aside from HPI   Musculoskeletal:       Per HPI, otherwise negative  Skin: Negative.   Neurological: Positive for weakness. Negative for syncope.     Physical Exam Updated Vital Signs BP (!) 91/55 (BP Location: Left Arm)   Pulse 69   Temp 97.8 F (36.6 C) (Oral)   Resp 20   SpO2 97%   Physical Exam  Constitutional: He is oriented to person, place, and time. He has a sickly appearance. No distress.  HENT:  Head: Normocephalic and atraumatic.  Eyes: Conjunctivae and EOM are normal.  Cardiovascular: Normal rate and regular rhythm.   Pulmonary/Chest: Effort normal. No stridor. No respiratory distress.  Abdominal: He exhibits no distension.    Musculoskeletal: He exhibits no edema.  Neurological: He is alert and oriented to person, place, and time.  Skin: Skin is warm and dry.  Psychiatric: He has a normal mood and affect.  Nursing note and vitals reviewed.    ED Treatments / Results  Labs (all labs ordered  are listed, but only abnormal results are displayed) Labs Reviewed  BASIC METABOLIC PANEL - Abnormal; Notable for the following:       Result Value   Chloride 99 (*)    Glucose, Bld 112 (*)    BUN  27 (*)    GFR calc non Af Amer 57 (*)    All other components within normal limits  CBC - Abnormal; Notable for the following:    WBC 10.9 (*)    RBC 4.06 (*)    HCT 37.9 (*)    All other components within normal limits  BRAIN NATRIURETIC PEPTIDE - Abnormal; Notable for the following:    B Natriuretic Peptide 140.5 (*)    All other components within normal limits  I-STAT TROPONIN, ED  POCT I-STAT TROPONIN I    EKG  EKG Interpretation  Date/Time:  Saturday July 25 2017 08:58:00 EDT Ventricular Rate:  72 PR Interval:    QRS Duration: 98 QT Interval:  420 QTC Calculation: 460 R Axis:   13 Text Interpretation:  Sinus or ectopic atrial rhythm Baseline wander in lead(s) V3 No significant change since last tracing Confirmed by Orlie Dakin [phone]) on 07/25/2017 9:04:17 AM Also confirmed by Orlie Dakin [phone]), editor Laurena Spies [phone])  on 07/25/2017 9:17:04 AM       Radiology Dg Chest 2 View  Result Date: 07/25/2017 CLINICAL DATA:  Shortness of breath for 2 weeks. EXAM: CHEST  2 VIEW COMPARISON:  December 20, 2013 and December 03, 2016 FINDINGS: No pneumothorax. Increased haziness in the medial right upper lobe was not seen previously. There are small effusions with underlying atelectasis. The cardiomediastinal silhouette is stable with a tortuous thoracic aorta. No other acute abnormalities. IMPRESSION: 1. Small bilateral effusions, left greater than right, with underlying atelectasis. 2. Mild haziness in the right upper lobe is nonspecific but an early infiltrate is not excluded. Recommend follow-up to resolution. Electronically Signed   By: Dorise Bullion III M.D   On: 07/25/2017 09:30    Procedures Procedures (including critical care time)  Medications Ordered in  ED Medications  azithromycin (ZITHROMAX) 500 mg in dextrose 5 % 250 mL IVPB (500 mg Intravenous New Bag/Given 07/25/17 1454)  enoxaparin (LOVENOX) injection 30 mg (not administered)  cefTRIAXone (ROCEPHIN) 1 g in dextrose 5 % 50 mL IVPB (0 g Intravenous Stopped 07/25/17 1454)     Initial Impression / Assessment and Plan / ED Course  I have reviewed the triage vital signs and the nursing notes.  Pertinent labs & imaging results that were available during my care of the patient were reviewed by me and considered in my medical decision making (see chart for details).   Elderly male presents with dyspnea. Patient has multiple medical issues, and is currently being evaluated for bladder malignancy, but has not yet started chemotherapy or radiation therapy. Here the patient is awake and alert, but has mild hypoxia, with oxygen saturation 94% on room air. Evaluation notable for dilatation of bilateral pleural effusion and some evidence for possible pneumonia. Patient is afebrile, but does have mild leukocytosis, he received antibiotics for this possibility. Given the patient's malignancy, there is some suspicion for malignant pleural effusion, though patient also has some evidence for fluid overload status with both lower extremity edema and stomach swelling. Given these multiple new findings, the patient was admitted for further evaluation and management.  Final Clinical Impressions(s) / ED Diagnoses   Final diagnoses:  Short of breath on exertion      Carmin Muskrat, MD 07/25/17 1526

## 2017-07-25 NOTE — ED Notes (Signed)
Spoke with pt and wife to update regarding delays

## 2017-07-26 ENCOUNTER — Inpatient Hospital Stay (HOSPITAL_COMMUNITY): Payer: Medicare HMO

## 2017-07-26 DIAGNOSIS — R0902 Hypoxemia: Secondary | ICD-10-CM

## 2017-07-26 DIAGNOSIS — I2510 Atherosclerotic heart disease of native coronary artery without angina pectoris: Secondary | ICD-10-CM

## 2017-07-26 DIAGNOSIS — I10 Essential (primary) hypertension: Secondary | ICD-10-CM

## 2017-07-26 DIAGNOSIS — E785 Hyperlipidemia, unspecified: Secondary | ICD-10-CM

## 2017-07-26 DIAGNOSIS — I351 Nonrheumatic aortic (valve) insufficiency: Secondary | ICD-10-CM

## 2017-07-26 LAB — CBC
HEMATOCRIT: 35.1 % — AB (ref 39.0–52.0)
Hemoglobin: 12.2 g/dL — ABNORMAL LOW (ref 13.0–17.0)
MCH: 32.6 pg (ref 26.0–34.0)
MCHC: 34.8 g/dL (ref 30.0–36.0)
MCV: 93.9 fL (ref 78.0–100.0)
PLATELETS: 228 10*3/uL (ref 150–400)
RBC: 3.74 MIL/uL — ABNORMAL LOW (ref 4.22–5.81)
RDW: 14.1 % (ref 11.5–15.5)
WBC: 9.1 10*3/uL (ref 4.0–10.5)

## 2017-07-26 LAB — COMPREHENSIVE METABOLIC PANEL
ALT: 19 U/L (ref 17–63)
AST: 51 U/L — ABNORMAL HIGH (ref 15–41)
Albumin: 2.6 g/dL — ABNORMAL LOW (ref 3.5–5.0)
Alkaline Phosphatase: 50 U/L (ref 38–126)
Anion gap: 8 (ref 5–15)
BUN: 25 mg/dL — AB (ref 6–20)
CHLORIDE: 100 mmol/L — AB (ref 101–111)
CO2: 28 mmol/L (ref 22–32)
CREATININE: 1.03 mg/dL (ref 0.61–1.24)
Calcium: 8.7 mg/dL — ABNORMAL LOW (ref 8.9–10.3)
GFR calc Af Amer: >60 mL/min (ref >60)
GFR calc non Af Amer: >60 mL/min (ref >60)
Glucose, Bld: 79 mg/dL (ref 65–99)
Potassium: 4 mmol/L (ref 3.5–5.1)
Sodium: 136 mmol/L (ref 135–145)
Total Bilirubin: 0.7 mg/dL (ref 0.3–1.2)
Total Protein: 5.1 g/dL — ABNORMAL LOW (ref 6.5–8.1)

## 2017-07-26 LAB — TSH: TSH: 1.698 u[IU]/mL (ref 0.350–4.500)

## 2017-07-26 LAB — TROPONIN I

## 2017-07-26 LAB — ECHOCARDIOGRAM COMPLETE

## 2017-07-26 MED ORDER — FUROSEMIDE 10 MG/ML IJ SOLN
20.0000 mg | Freq: Once | INTRAMUSCULAR | Status: AC
  Administered 2017-07-26: 20 mg via INTRAVENOUS
  Filled 2017-07-26: qty 2

## 2017-07-26 MED ORDER — POLYETHYLENE GLYCOL 3350 17 G PO PACK
17.0000 g | PACK | Freq: Every day | ORAL | Status: DC | PRN
  Administered 2017-07-26: 17 g via ORAL
  Filled 2017-07-26: qty 1

## 2017-07-26 NOTE — Progress Notes (Addendum)
PROGRESS NOTE  Derrick Rivera YNW:[id number] DOB: 1924-09-12 DOA: 07/25/2017 PCP: [name], MD   LOS: 1 day   Brief Narrative / Interim history: 83 year old male with history of CAD seen by Dr. [name] with cardiology, non-Hodgkin's lymphoma, urinary bladder cancer supposed to start chemotherapy in few weeks, was admitted to the hospital on 8/25 with progressive dyspnea on exertion and chest "heaviness" for the past couple weeks as well as bilateral lower extremity swelling.  He denies any chest pain.  Assessment & Plan: Active Problems:   Essential hypertension   Coronary artery disease   Obstructive sleep apnea hypopnea, moderate   Hyperlipidemia LDL goal <70   Pneumonia   Acute on chronic CHF -most recent 2D echo in 2013 with normal EF. Patient reports progressive DOE and chest "heaviness" for the past 2 weeks and fluid build up in his legs. He has also been having orthopnea. No chest pain -2d echo here pending to determine type of CHF, cardiology consult in am  CAD -no chest pain, however DOE concerning, ?needs ischemic evaluation -consult cardiology in am -continue Aspirin  Possible pneumonia -will treat but less likely patient without any fever/productive coughing  HTN -soft BP today. Received all his BP meds last night, now will hold  -needs Lasix, if BP stable will start later today   High grade urothelial carcinoma, invasive -not a surgical candidate, seeing Dr. Alen Blew and will start chemotherapy soon -recently admitted July 2018 on Urology service and underwent cystoscopy with transuretral resection of bladder tumor and with a right ureteral stent -outpatient management   BPH -on Flomax  HLD -on Lipitor   DVT prophylaxis: Lovenox Code Status: Full code Family Communication: d/w daughter and wife in the room Disposition Plan: home when ready   Consultants:   Cardiology in am   Procedures:   2D echo: pending  Antimicrobials:  Ceftriaxone 8/25  >>  Azithromycin 8/25 >>   Subjective: -Complains of shortness of breath with minimal ambulation, no abdominal pain, nausea or vomiting.  No lightheadedness or dizziness.  Objective: Vitals:   07/26/17 0509 07/26/17 0843 07/26/17 1006 07/26/17 1118  BP: (!) 95/54 (!) 90/55 (!) 99/55 (!) 97/57  Pulse: 67 67 62 60  Resp: 19 (!) 22 19   Temp: 97.7 F (36.5 C) 98.8 F (37.1 C) (!) 97.5 F (36.4 C)   TempSrc: Oral Oral Oral   SpO2: 98% 98% 96%     Intake/Output Summary (Last 24 hours) at 07/26/17 1315 Last data filed at 07/26/17 0843  Gross per 24 hour  Intake              920 ml  Output                0 ml  Net              920 ml   There were no vitals filed for this visit.  Examination:  Vitals:   07/26/17 0509 07/26/17 0843 07/26/17 1006 07/26/17 1118  BP: (!) 95/54 (!) 90/55 (!) 99/55 (!) 97/57  Pulse: 67 67 62 60  Resp: 19 (!) 22 19   Temp: 97.7 F (36.5 C) 98.8 F (37.1 C) (!) 97.5 F (36.4 C)   TempSrc: Oral Oral Oral   SpO2: 98% 98% 96%     Constitutional: NAD Eyes:  lids and conjunctivae normal ENMT: Mucous membranes are moist.  Neck: normal, supple Respiratory: Bibasilar crackles, no wheezing. Normal respiratory effort. No accessory muscle use.  Cardiovascular: Regular rate  and rhythm, no murmurs / rubs / gallops. 2+ pitting LE edema. 2+ pedal pulses. + JVD Abdomen: no tenderness. Bowel sounds positive.  Skin: no rashes, lesions, ulcers. No induration Neurologic: CN 2-12 grossly intact. Strength 5/5 in all 4.  Psychiatric: Normal judgment and insight. Alert and oriented x 3. Normal mood.    Data Reviewed: I have independently reviewed following labs and imaging studies  CXR  CBC:  Recent Labs Lab 07/25/17 1000 07/26/17 0439  WBC 10.9* 9.1  HGB 13.2 12.2*  HCT 37.9* 35.1*  MCV 93.3 93.9  PLT 287 505   Basic Metabolic Panel:  Recent Labs Lab 07/25/17 1000 07/26/17 0439  NA 137 136  K 3.8 4.0  CL 99* 100*  CO2 24 28  GLUCOSE 112* 79   BUN 27* 25*  CREATININE 1.10 1.03  CALCIUM 9.3 8.7*   GFR: Estimated Creatinine Clearance: 49.2 mL/min (by C-G formula based on SCr of 1.03 mg/dL). Liver Function Tests:  Recent Labs Lab 07/26/17 0439  AST 51*  ALT 19  ALKPHOS 50  BILITOT 0.7  PROT 5.1*  ALBUMIN 2.6*   No results for input(s): LIPASE, AMYLASE in the last 168 hours. No results for input(s): AMMONIA in the last 168 hours. Coagulation Profile: No results for input(s): INR, PROTIME in the last 168 hours. Cardiac Enzymes: No results for input(s): CKTOTAL, CKMB, CKMBINDEX, TROPONINI in the last 168 hours. BNP (last 3 results) No results for input(s): PROBNP in the last 8760 hours. HbA1C: No results for input(s): HGBA1C in the last 72 hours. CBG: No results for input(s): GLUCAP in the last 168 hours. Lipid Profile: No results for input(s): CHOL, HDL, LDLCALC, TRIG, CHOLHDL, LDLDIRECT in the last 72 hours. Thyroid Function Tests:  Recent Labs  07/26/17 0439  TSH 1.698   Anemia Panel: No results for input(s): VITAMINB12, FOLATE, FERRITIN, TIBC, IRON, RETICCTPCT in the last 72 hours. Urine analysis:    Component Value Date/Time   LABSPEC 1.030 07/20/2017 1427   PHURINE 6.0 07/20/2017 1427   GLUCOSEU Negative 07/20/2017 1427   HGBUR Large 07/20/2017 1427   BILIRUBINUR Negative 07/20/2017 1427   KETONESUR Negative 07/20/2017 1427   PROTEINUR 300 07/20/2017 1427   UROBILINOGEN 0.2 07/20/2017 1427   NITRITE Negative 07/20/2017 1427   LEUKOCYTESUR Moderate 07/20/2017 1427   Sepsis Labs: Invalid input(s): PROCALCITONIN, LACTICIDVEN  Recent Results (from the past 240 hour(s))  Urine culture     Status: None   Collection Time: 07/20/17  2:27 PM  Result Value Ref Range Status   Urine Culture, Routine Final report  Final   Organism ID, Bacteria Comment  Final    Comment: Culture shows less than 10,000 colony forming units of bacteria per milliliter of urine. This colony count is not generally  considered to be clinically significant.       Radiology Studies: Dg Chest 2 View  Result Date: 07/25/2017 CLINICAL DATA:  Shortness of breath for 2 weeks. EXAM: CHEST  2 VIEW COMPARISON:  December 20, 2013 and December 03, 2016 FINDINGS: No pneumothorax. Increased haziness in the medial right upper lobe was not seen previously. There are small effusions with underlying atelectasis. The cardiomediastinal silhouette is stable with a tortuous thoracic aorta. No other acute abnormalities. IMPRESSION: 1. Small bilateral effusions, left greater than right, with underlying atelectasis. 2. Mild haziness in the right upper lobe is nonspecific but an early infiltrate is not excluded. Recommend follow-up to resolution. Electronically Signed   By: Dorise Bullion III M.D   On:  07/25/2017 09:30     Scheduled Meds: . acetaminophen  325 mg Oral BID  . aspirin EC  162.5 mg Oral QPM  . atorvastatin  20 mg Oral q1800  . enoxaparin (LOVENOX) injection  30 mg Subcutaneous Q24H  . feeding supplement (ENSURE ENLIVE)  237 mL Oral BID BM  . multivitamin with minerals  1 tablet Oral Daily  . omega-3 acid ethyl esters  1 g Oral Daily  . tamsulosin  0.4 mg Oral QPC supper  . vitamin C  500 mg Oral Daily   Continuous Infusions: . azithromycin    . cefTRIAXone (ROCEPHIN)  IV       Time spent: 40 minutes in 3 separate visits, >50% bedside involved in counseling / discussions / answering questions with patient and his family   Marzetta Board, MD, PhD [facility] Pager [phone]  If 7PM-7AM, please contact night-coverage [email or web address] Password [email or web address] 07/26/2017, 1:15 PM

## 2017-07-26 NOTE — Progress Notes (Signed)
Pt transported to [place] for cardiac monitoring via wc accompanied by NT x 2.

## 2017-07-26 NOTE — Progress Notes (Signed)
Patient received from [facility]. Resting comfortably in bed in no apparent distress. A&Ox4. Cardiac monitor with continuous pulse oximetry applied. Oriented to room and call bell. Instructed to call and await staff assistance before getting OOB. Patient states understanding. Agree with %W RN Shift Assessment. Call bell in reach. Will continue to monitor.  Lind Guest, RN

## 2017-07-26 NOTE — Progress Notes (Signed)
  Echocardiogram 2D Echocardiogram has been performed.  Tresa Res 07/26/2017, 2:09 PM

## 2017-07-26 NOTE — Progress Notes (Signed)
Report called to [facility] and given to Katharine Look, RN who will be receiving pt in 1426 per MD order. Telemetry orders in place. Pt for Echocardiogram today. Spoke with Candice who reported tech would do echo later today on rounds at [place]. Katharine Look, RN on [facility] made aware.

## 2017-07-27 ENCOUNTER — Inpatient Hospital Stay (HOSPITAL_COMMUNITY): Payer: Medicare HMO

## 2017-07-27 ENCOUNTER — Encounter (HOSPITAL_COMMUNITY): Payer: Self-pay | Admitting: Cardiology

## 2017-07-27 DIAGNOSIS — I5032 Chronic diastolic (congestive) heart failure: Secondary | ICD-10-CM

## 2017-07-27 LAB — BASIC METABOLIC PANEL
Anion gap: 12 (ref 5–15)
BUN: 27 mg/dL — AB (ref 6–20)
CALCIUM: 9 mg/dL (ref 8.9–10.3)
CHLORIDE: 99 mmol/L — AB (ref 101–111)
CO2: 27 mmol/L (ref 22–32)
CREATININE: 1.08 mg/dL (ref 0.61–1.24)
GFR calc non Af Amer: 58 mL/min — ABNORMAL LOW (ref >60)
Glucose, Bld: 81 mg/dL (ref 65–99)
Potassium: 3.5 mmol/L (ref 3.5–5.1)
SODIUM: 138 mmol/L (ref 135–145)

## 2017-07-27 LAB — CBC
HCT: 36.4 % — ABNORMAL LOW (ref 39.0–52.0)
Hemoglobin: 12.5 g/dL — ABNORMAL LOW (ref 13.0–17.0)
MCH: 31.9 pg (ref 26.0–34.0)
MCHC: 34.3 g/dL (ref 30.0–36.0)
MCV: 92.9 fL (ref 78.0–100.0)
PLATELETS: 231 10*3/uL (ref 150–400)
RBC: 3.92 MIL/uL — AB (ref 4.22–5.81)
RDW: 14 % (ref 11.5–15.5)
WBC: 8.1 10*3/uL (ref 4.0–10.5)

## 2017-07-27 MED ORDER — ALUM & MAG HYDROXIDE-SIMETH 200-200-20 MG/5ML PO SUSP
30.0000 mL | Freq: Once | ORAL | Status: AC
  Administered 2017-07-27: 30 mL via ORAL
  Filled 2017-07-27: qty 30

## 2017-07-27 MED ORDER — FUROSEMIDE 10 MG/ML IJ SOLN
40.0000 mg | Freq: Every day | INTRAMUSCULAR | Status: DC
  Administered 2017-07-27: 40 mg via INTRAVENOUS
  Filled 2017-07-27: qty 4

## 2017-07-27 MED ORDER — POTASSIUM CHLORIDE CRYS ER 20 MEQ PO TBCR
40.0000 meq | EXTENDED_RELEASE_TABLET | Freq: Once | ORAL | Status: AC
Start: 2017-07-27 — End: 2017-07-27
  Administered 2017-07-27: 40 meq via ORAL
  Filled 2017-07-27: qty 2

## 2017-07-27 MED ORDER — ENOXAPARIN SODIUM 40 MG/0.4ML ~~LOC~~ SOLN
40.0000 mg | SUBCUTANEOUS | Status: DC
  Administered 2017-07-27 – 2017-07-28 (×2): 40 mg via SUBCUTANEOUS
  Filled 2017-07-27 (×2): qty 0.4

## 2017-07-27 MED ORDER — FUROSEMIDE 10 MG/ML IJ SOLN
40.0000 mg | Freq: Two times a day (BID) | INTRAMUSCULAR | Status: DC
  Administered 2017-07-27 – 2017-07-29 (×4): 40 mg via INTRAVENOUS
  Filled 2017-07-27 (×3): qty 4

## 2017-07-27 NOTE — Telephone Encounter (Signed)
Opened in Error.

## 2017-07-27 NOTE — Consult Note (Signed)
Cardiology Consultation:   Patient ID: Derrick Rivera; [id number]; 08-29-1924   Admit date: 07/25/2017 Date of Consult: 07/27/2017  Primary Care Provider: [name], MD Primary Cardiologist: Dr. Corky Downs  Primary Electrophysiologist:  NA   Patient Profile:   Derrick Rivera is a 83 y.o. male with a hx of CAD with total occl or RCA and HSRA of LAD in multiple sites and stent in prox to mid lesion, OSA not treated,  HTN, HLD non hodgkin's lymphoma and bladder cancer who is being seen today for the evaluation of acute on chronic HF and chest pressure at the request of Dr. Cruzita Lederer.  History of Present Illness:   [name] a hx of CAD with total occl or RCA and HSRA of LAD in multiple sites and stent in prox to mid lesion, OSA not treated,  HTN, HLD, non hodgkin's lymphoma and bladder cancer that was admitted 07/25/17 due to DOE and chest heaviness.  The SOB increased over the last 2 weeks, but worse 2 days prior to admit.  Also with lightheadedness.  He has had wt loss due to decreased appetite.   He did complain of chest pressure.   He denies chest pain.  And deep breathing helps the pressure.    BNP 140.5 Troponin 0.02, <0.03 WBC on admit 10.9 now 8.1 K+ 3.5 today and Cr 1.08 Hgb 12.5   TSH 1.698  CXR with small bil effusions and possible infiltrate. ON ABX Echo with normal EF and G1DD  EKG:  The EKG was personally reviewed and demonstrates:  SR with no acute changes, P waves are difficult to see in several leads  Telemetry:  Telemetry was personally reviewed and demonstrates: SR   Has rec'd lasix IV 20 mg -- IV now on 40 IV daily.   I&O +1620.  Currently dyspneic with conversation.  RN giving IV lasix now.  He denies palpitations, rapid HR.  No chest pain only constant pressure.     Past Medical History:  Diagnosis Date  . Allergic rhinitis   . Bilateral carotid artery stenosis    per last duplex [id number]  RICA 99-83%,  LICA 3-82%  . Bladder cancer [facility]) urologist-  dr  Alyson Ingles   Non-invasise High Grade  . Chronic cough    ? lisinopril intolerence  . Coronary artery disease cardiologiat-  dr Shelva Majestic   hx PCI and stenting 2003/  per cardiac cath total occlusion RCA w/ collaterals 1995  . Diverticulosis of colon   . History of adenomatous polyp of colon    tubular adenoma's 1992; 1993; 1997; 2007  . History of colonic polyps   . History of nonmelanoma skin cancer    BCC and SCC right side nose  s/p moh's reseciton and reconstruction 2013/  left ear  01/ 2017/   06/ 2017 in office excision 2 areas on scalp  . History of TIAs takes ASA   pt states has a few what he thinks ia tia's, states last time with symptoms of veritgo and thought process was 04/ 2017  . HTN (hypertension)   . Hypercholesterolemia   . Non-Hodgkin lymphoma, unspecified, unspecified site [facility]) oncologist-  dr Benay Spice--  pt in clinical remission since 2000   dx 1996  no specific therapy since 2000  . OA (osteoarthritis)   . OSA (obstructive sleep apnea)    moderate per study 08-10-2013--  intolerant cpap  . Prostate nodule   . Thrombocytopenia (HCC)    chronic mild  . Wears glasses  Past Surgical History:  Procedure Laterality Date  . CARDIOVASCULAR STRESS TEST  07-25-2016 dr Shelva Majestic   Intermediate risk  nuclear study w/ a medium defect of moderate severity present in the basal inferolateral, mid inferlateral , and apical inferior location-- Findings consistent w/ ischemia and prior MI w/ per-infarct ischemia-- Per Dr [name] mild inferior ischemia; similar defect in 2014 study but less scar on present study:  Nuclear stress EF 52%  . CATARACT EXTRACTION W/ INTRAOCULAR LENS  IMPLANT, BILATERAL  2013  . CORONARY ANGIOPLASTY  1995   total occlusion RCA w/ collaterals;  PTCA 2nd marginal LCFX  . CORONARY ANGIOPLASTY WITH STENT PLACEMENT  01-05-2002  dr Shelva Majestic   Complex high-speed rotational atherectomy of entire LAD multiple sites and stenting proximal and mid LAD/   no re-stenosis in prior CFX marginal dilatation site and RCA occluded w/ left to right collaterals,  ef 57%  . CYSTOSCOPY W/ RETROGRADES Bilateral 12/22/2016   Procedure: CYSTOSCOPY WITH RETROGRADE PYELOGRAM;  Surgeon: Cleon Gustin, MD;  Location: [facility];  Service: Urology;  Laterality: Bilateral;  . CYSTOSCOPY W/ URETERAL STENT PLACEMENT Bilateral 06/29/2017   Procedure: CYSTOSCOPY WITH RETROGRADE PYELOGRAM/URETERAL BILATERAL STENT PLACEMENT;  Surgeon: Cleon Gustin, MD;  Location: [facility];  Service: Urology;  Laterality: Bilateral;  . CYSTOSCOPY WITH BIOPSY N/A 06/29/2017   Procedure: CYSTOSCOPY WITH BIOPSY;  Surgeon: Cleon Gustin, MD;  Location: [facility];  Service: Urology;  Laterality: N/A;  . CYSTOSCOPY/RETROGRADE/URETEROSCOPY/STONE EXTRACTION WITH BASKET Bilateral 05/05/2016   Procedure: CYSTOSCOPY WITH RETROGRADE PYELOGRAM;  Surgeon: Cleon Gustin, MD;  Location: [facility];  Service: Urology;  Laterality: Bilateral;  . MOHS SURGERY  2013   at [facility]   nose -- w/ Reconstructive surgery same year  . MOHS SURGERY  01/ 2017   left ear  . REFRACTIVE SURGERY Left 05/ 2017  . TONSILLECTOMY  age 60  . TOTAL KNEE ARTHROPLASTY Left 03-07-2002  . TOTAL SHOULDER ARTHROPLASTY Left 04-30-2005  . TRANSTHORACIC ECHOCARDIOGRAM  04-29-2012   borderline inferior wall hypokinesis, ef 65%/  mild dilated RV/  mild to moderate AV calcification without stenosis/  mild AR, TR, and  MR/  moderate PR/  mild aortic root dilatation  . TRANSURETHRAL RESECTION OF BLADDER TUMOR N/A 12/22/2016   Procedure: TRANSURETHRAL RESECTION OF BLADDER TUMOR (TURBT);  Surgeon: Cleon Gustin, MD;  Location: [facility];  Service: Urology;  Laterality: N/A;  . TRANSURETHRAL RESECTION OF BLADDER TUMOR WITH GYRUS (TURBT-GYRUS) N/A 05/05/2016   Procedure: TRANSURETHRAL RESECTION OF BLADDER TUMOR WITH GYRUS (TURBT-GYRUS);  Surgeon: Cleon Gustin, MD;  Location: [facility];  Service:  Urology;  Laterality: N/A;  . TRANSURETHRAL RESECTION OF BLADDER TUMOR WITH GYRUS (TURBT-GYRUS) N/A 06/09/2016   Procedure: TRANSURETHRAL RESECTION OF BLADDER TUMOR WITH GYRUS (TURBT-GYRUS);  Surgeon: Cleon Gustin, MD;  Location: [facility];  Service: Urology;  Laterality: N/A;     Home Medications:  Prior to Admission medications   Medication Sig Start Date End Date Taking? Authorizing Provider  acetaminophen (TYLENOL) 500 MG tablet Take 250 mg by mouth 2 (two) times daily.   Yes [provider]  Ascorbic Acid (VITAMIN C) 500 MG tablet Take 500 mg by mouth daily.    Yes [provider]  aspirin EC 325 MG tablet Take 162.5 mg by mouth every evening.    Yes [provider]  atenolol (TENORMIN) 50 MG tablet TAKE 1/2 TABLET (25 MG TOTAL) BY MOUTH 2 (TWO) TIMES DAILY.  12/25/16  Yes Troy Sine, MD  atorvastatin (LIPITOR) 20 MG tablet TAKE 1 TABLET (20 MG TOTAL) BY MOUTH DAILY. Patient taking differently: TAKE 1 TABLET (20 MG TOTAL) BY MOUTH DAILY AT NIGHT 04/02/17  Yes Troy Sine, MD  fish oil-omega-3 fatty acids 1000 MG capsule Take 1 g by mouth daily. Take one capsule by mouth daily   Yes [provider]  hydrochlorothiazide (MICROZIDE) 12.5 MG capsule TAKE ONE CAPSULE BY MOUTH DAILY 04/02/17  Yes Troy Sine, MD  isosorbide mononitrate (IMDUR) 60 MG 24 hr tablet TAKE ONE AND ONE-HALF TABLET BY MOUTH DAILY AROUND NOON 07/23/17  Yes Isaiah Serge, NP  Multiple Vitamins-Minerals (MULTIVITAMIN WITH MINERALS) tablet Take one tablet by mouth every other day  In AM   Yes [provider]  prochlorperazine (COMPAZINE) 10 MG tablet Take 1 tablet (10 mg total) by mouth every 6 (six) hours as needed for nausea or vomiting. 07/15/17  Yes Wyatt Portela, MD  tamsulosin (FLOMAX) 0.4 MG CAPS capsule Take 1 capsule (0.4 mg total) by mouth daily after supper. 07/20/17  Yes Hayden Pedro, PA-C  verapamil (CALAN-SR) 180 MG CR tablet  Take 1 tablet (180 mg total) by mouth at bedtime. 02/16/17  Yes Troy Sine, MD  Azelastine-Fluticasone 137-50 MCG/ACT SUSP Place 1 spray into the nose daily as needed. Patient not taking: Reported on 07/25/2017 01/23/17   [name], MD  traMADol (ULTRAM) 50 MG tablet Take 1 tablet (50 mg total) by mouth every 4 (four) hours as needed. Patient not taking: Reported on 07/25/2017 06/29/17 06/29/18  Cleon Gustin, MD    Inpatient Medications: Scheduled Meds: . acetaminophen  325 mg Oral BID  . aspirin EC  162.5 mg Oral QPM  . atorvastatin  20 mg Oral q1800  . enoxaparin (LOVENOX) injection  30 mg Subcutaneous Q24H  . feeding supplement (ENSURE ENLIVE)  237 mL Oral BID BM  . furosemide  40 mg Intravenous Daily  . multivitamin with minerals  1 tablet Oral Daily  . omega-3 acid ethyl esters  1 g Oral Daily  . potassium chloride  40 mEq Oral Once  . tamsulosin  0.4 mg Oral QPC supper  . vitamin C  500 mg Oral Daily   Continuous Infusions: . azithromycin Stopped (07/26/17 1905)  . cefTRIAXone (ROCEPHIN)  IV Stopped (07/26/17 1700)   PRN Meds: polyethylene glycol  Allergies:    Allergies  Allergen Reactions  . Penicillins Other (See Comments)    Did not help with strep throat--Unknown reaction  Has patient had a PCN reaction causing immediate rash, facial/tongue/throat swelling, SOB or lightheadedness with hypotension: No Has patient had a PCN reaction causing severe rash involving mucus membranes or skin necrosis: No Has patient had a PCN reaction that required hospitalization: No Has patient had a PCN reaction occurring within the last 10 years: No If all of the above answers are "NO", then may proceed with Cephalosporin use.      Social History:   Social History   Social History  . Marital status: Married    Spouse name: Statistician  . Number of children: 3  . Years of education: N/A   Occupational History  . retired-sales Chief Financial Officer    Social History Main Topics   . Smoking status: Former Smoker    Packs/day: 2.00    Years: 20.00    Types: Cigarettes    Quit date: 12/01/1966  . Smokeless tobacco: Never Used  . Alcohol use No  .  Drug use: No  . Sexual activity: No   Other Topics Concern  . Not on file   Social History Narrative   Married 61 years in 2015. 3 kids. 5 grandkids.       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 Pancreatic cancer Mother   . Heart disease Father      ROS:  Please see the history of present illness.  ROS  General:no colds or fevers, no weight changes Skin:no rashes or ulcers HEENT:no blurred vision, no congestion CV:see HPI PUL:see HPI GI:no diarrhea constipation or melena, no indigestion GU:no hematuria, no dysuria MS:no joint pain, no claudication Neuro:no syncope, no lightheadedness Endo:no diabetes, no thyroid disease      Physical Exam/Data:   Vitals:   07/26/17 1534 07/26/17 2020 07/27/17 0339 07/27/17 0419  BP: 106/82 113/68  (!) 101/55  Pulse: 70 82  79  Resp: 18 18  17   Temp: (!) 97.5 F (36.4 C) 98 F (36.7 C)  97.9 F (36.6 C)  TempSrc: Oral Oral  Oral  SpO2: 98% 99%  96%  Weight:   171 lb 1.2 oz (77.6 kg)   Height:  5\' 10"  (1.778 m) 5\' 10"  (1.778 m)     Intake/Output Summary (Last 24 hours) at 07/27/17 0818 Last data filed at 07/27/17 0500  Gross per 24 hour  Intake              900 ml  Output              200 ml  Net              700 ml   Filed Weights   07/27/17 0339  Weight: 171 lb 1.2 oz (77.6 kg)   Body mass index is 24.55 kg/m.  General:  Well nourished, well developed, in mild distress with SOB HEENT: normal Lymph: no adenopathy Neck: no JVD Endocrine:  No  thryomegaly Vascular: No carotid bruits; FA pulses 2+ bilaterally without bruits  Cardiac:  normal S1, S2; RRR; no murmur gallup rub or click Lungs:  clear to auscultation bilaterally, no wheezing, rhonchi or rales  Abd: soft, nontender, no hepatomegaly  Ext: no edema Musculoskeletal:  No deformities, BUE and BLE strength normal and equal Skin: warm and dry  Neuro:  CNs 2-12 intact, no focal abnormalities noted Psych:  Normal affect   Relevant CV Studies: ECHO 07/26/17  Study Conclusions  - Left ventricle: The cavity size was normal. Systolic function was   normal. The estimated ejection fraction was in the range of 60%   to 65%. Wall motion was normal; there were no regional wall   motion abnormalities. Doppler parameters are consistent with   abnormal left ventricular relaxation (grade 1 diastolic   dysfunction). There was no evidence of elevated ventricular   filling pressure by Doppler parameters. - Aortic valve: There was mild regurgitation. - Aortic root: The aortic root was normal in size. - Mitral valve: There was no regurgitation. - Right ventricle: The cavity size was mildly dilated. Wall   thickness was normal. Systolic function was normal. - Tricuspid valve: There was trivial regurgitation. - Inferior vena cava:  Not visualized.  Laboratory Data:  Chemistry Recent Labs Lab 07/25/17 1000 07/26/17 0439 07/27/17 0409  NA 137 136 138  K 3.8 4.0 3.5  CL 99* 100* 99*  CO2 24 28 27   GLUCOSE 112* 79 81  BUN 27* 25* 27*  CREATININE 1.10 1.03 1.08  CALCIUM 9.3 8.7* 9.0  GFRNONAA 57* >60 58*  GFRAA >60 >60 >60  ANIONGAP 14 8 12      Recent Labs Lab 07/26/17 0439  PROT 5.1*  ALBUMIN 2.6*  AST 51*  ALT 19  ALKPHOS 50  BILITOT 0.7   Hematology Recent Labs Lab 07/25/17 1000 07/26/17 0439 07/27/17 0409  WBC 10.9* 9.1 8.1  RBC 4.06* 3.74* 3.92*  HGB 13.2 12.2* 12.5*  HCT 37.9* 35.1* 36.4*  MCV 93.3 93.9 92.9  MCH 32.5 32.6 31.9  MCHC 34.8 34.8 34.3    RDW 13.9 14.1 14.0  PLT 287 228 231   Cardiac Enzymes Recent Labs Lab 07/26/17 1350  TROPONINI <0.03    Recent Labs Lab 07/25/17 0935  TROPIPOC 0.02    BNP Recent Labs Lab 07/25/17 0924  BNP 140.5*    DDimer No results for input(s): DDIMER in the last 168 hours.  Radiology/Studies:  Dg Chest 2 View  Result Date: 07/25/2017 CLINICAL DATA:  Shortness of breath for 2 weeks. EXAM: CHEST  2 VIEW COMPARISON:  December 20, 2013 and December 03, 2016 FINDINGS: No pneumothorax. Increased haziness in the medial right upper lobe was not seen previously. There are small effusions with underlying atelectasis. The cardiomediastinal silhouette is stable with a tortuous thoracic aorta. No other acute abnormalities. IMPRESSION: 1. Small bilateral effusions, left greater than right, with underlying atelectasis. 2. Mild haziness in the right upper lobe is nonspecific but an early infiltrate is not excluded. Recommend follow-up to resolution. Electronically Signed   By: Dorise Bullion III M.D   On: 07/25/2017 09:30    Assessment and Plan:   1. Acute diastolic  HF in association with PNA. IV  Lasix now 40 mg would increase to BID.  Follow Cr.    2. PNA per IM on ABX 3. CAD with neg troponin and stable echo with no WMA --resume BB when BP improved 4. HLD continue statin Cecilie Kicks, NP  07/27/2017 8:18 AM   Patient examined chart reviewed He feels better this am Exam with no murmur and currently just minimal basilar atelectasis on exam. He does not appear toxic. BNP minimally elevated does not appear volume overloaded will likely only need daily lasix for 1-2 days. Would repeat CXR in 48 hours continue antibiotic Rx No evidence of acute coronary syndrome ok to continue to hold beta blocker   Jenkins Rouge

## 2017-07-27 NOTE — Progress Notes (Signed)
Initial Nutrition Assessment  INTERVENTION:   Continue Ensure Enlive po BID, each supplement provides 350 kcal and 20 grams of protein RD will continue to monitor for needs  NUTRITION DIAGNOSIS:   Inadequate oral intake related to poor appetite as evidenced by per patient/family report.  GOAL:   Patient will meet greater than or equal to 90% of their needs  MONITOR:   PO intake, Supplement acceptance, Labs, Weight trends, I & O's  REASON FOR ASSESSMENT:   Malnutrition Screening Tool    ASSESSMENT:   83 y.o. male with medical history significant of coronary artery disease history of stent in 2003 by Dr. Shelva Majestic, history of non-Hodgkin's lymphoma, recent diagnosis of cancer of the bladder urinary bladder followed by Dr. Tammi Klippel admitted with shortness of breath for the last 2 weeks. Patient came to the ER today because the breathing difficulty has been progressively getting worse. He reports that he has no appetite he is feeling weak he has complaints of generalized weakness. He has lost some weight because of this reason he doesn't know how much weight he has lost.  Pt with bladder cancer, was expected to begin chemotherapy and radiation sometime this week. Pt has had poor appetite and weakness.  On 8/26, intakes were as follows: B: 100% (~400 kcal, 27g protein) L: 50% (~200 kcal, 6g protein)  Today he had raisin bran for breakfast. For lunch he had a grilled cheese and beef broth. Pt is drinking Ensure supplements provided.   Per chart review, pt has lost 10 lb since 4/9 (6% wt loss x 5 months, insignificant for time frame). Nutrition focused physical exam shows no sign of depletion of muscle mass or body fat.  Labs reviewed. Medications: Iv Lasix BID, Multivitamin with minerals daily, LOVAZA (omega 3) capsule daily, Vitamin C tablet daily  Diet Order:  Diet Heart Room service appropriate? Yes; Fluid consistency: Thin  Skin:  Reviewed, no issues  Last BM:   8/25  Height:   Ht Readings from Last 1 Encounters:  07/27/17 5\' 10"  (1.778 m)    Weight:   Wt Readings from Last 1 Encounters:  07/27/17 171 lb 1.2 oz (77.6 kg)    Ideal Body Weight:  75.5 kg  BMI:  Body mass index is 24.55 kg/m.  Estimated Nutritional Needs:   Kcal:  1900-2100  Protein:  90-100  Fluid:  1.9L/day  EDUCATION NEEDS:   No education needs identified at this time  Clayton Bibles, MS, RD, LDN Pager: [phone] After Hours Pager: [phone]

## 2017-07-27 NOTE — Progress Notes (Signed)
PROGRESS NOTE  Derrick Rivera TGG:[id number] DOB: [birth year] DOA: 07/25/2017 PCP: [name], MD   LOS: 2 days   Brief Narrative / Interim history: 83 year old male with history of CAD seen by Dr. [name] with cardiology, non-Hodgkin's lymphoma, urinary bladder cancer supposed to start chemotherapy in few weeks, was admitted to the hospital on 8/25 with progressive dyspnea on exertion and chest "heaviness" for the past couple weeks as well as bilateral lower extremity swelling.  He denies any chest pain.  Assessment & Plan: Active Problems:   Essential hypertension   Coronary artery disease   Obstructive sleep apnea hypopnea, moderate   Hyperlipidemia LDL goal <70   Pneumonia   Diastolic CHF, chronic (HCC)   Acute on chronic diastolic CHF -most recent 2D echo in 2013 with normal EF. Patient reports progressive DOE and chest "heaviness" for the past 2 weeks and fluid build up in his legs. He has also been having orthopnea. No chest pain -2D echo done 8/26 shows normal ejection fraction of 60-65%, and grade 1 diastolic dysfunction -Continue IV Lasix, cardiology following  CAD -no chest pain, however DOE concerning, ?needs ischemic evaluation -consult cardiology in am -continue Aspirin -Defer ischemic evaluation to cardiology  Possible pneumonia -will treat but less likely patient without any fever/productive coughing -Continue antibiotics today  HTN -Initially patient was soft blood pressures after admission as he received all his blood pressure meds, medications were discontinued on 8/26, blood pressure is improved now allowing for diuresis  High grade urothelial carcinoma, invasive -not a surgical candidate, seeing Dr. Alen Blew and will start chemotherapy soon -recently admitted July 2018 on Urology service and underwent cystoscopy with transuretral resection of bladder tumor and with a right ureteral stent -outpatient management   BPH -on Flomax  HLD -on  Lipitor   DVT prophylaxis: Lovenox Code Status: Full code Family Communication: No family at bedside today Disposition Plan: home when ready   Consultants:   Cardiology  Procedures:   2D echo Study Conclusions - Left ventricle: The cavity size was normal. Systolic function was normal. The estimated ejection fraction was in the range of 60% to 65%. Wall motion was normal; there were no regional wall motion abnormalities. Doppler parameters are consistent with abnormal left ventricular relaxation (grade 1 diastolic dysfunction). There was no evidence of elevated ventricular filling pressure by Doppler parameters. - Aortic valve: There was mild regurgitation. - Aortic root: The aortic root was normal in size. - Mitral valve: There was no regurgitation. - Right ventricle: The cavity size was mildly dilated. Wall thickness was normal. Systolic function was normal. - Tricuspid valve: There was trivial regurgitation. - Inferior vena cava: Not visualized.  Antimicrobials:  Ceftriaxone 8/25 >>  Azithromycin 8/25 >>   Subjective: -Feels a little bit better, appreciates that his breathing is improved some.  Denies any chest pain.  Denies any abdominal pain nausea or vomiting  Objective: Vitals:   07/26/17 1534 07/26/17 2020 07/27/17 0339 07/27/17 0419  BP: 106/82 113/68  (!) 101/55  Pulse: 70 82  79  Resp: 18 18  17   Temp: (!) 97.5 F (36.4 C) 98 F (36.7 C)  97.9 F (36.6 C)  TempSrc: Oral Oral  Oral  SpO2: 98% 99%  96%  Weight:   77.6 kg (171 lb 1.2 oz)   Height:  5\' 10"  (1.778 m) 5\' 10"  (1.778 m)     Intake/Output Summary (Last 24 hours) at 07/27/17 1210 Last data filed at 07/27/17 0500  Gross per 24 hour  Intake  660 ml  Output              200 ml  Net              460 ml   Filed Weights   07/27/17 0339  Weight: 77.6 kg (171 lb 1.2 oz)    Examination:  Vitals:   07/26/17 1534 07/26/17 2020 07/27/17 0339 07/27/17 0419  BP: 106/82 113/68  (!) 101/55   Pulse: 70 82  79  Resp: 18 18  17   Temp: (!) 97.5 F (36.4 C) 98 F (36.7 C)  97.9 F (36.6 C)  TempSrc: Oral Oral  Oral  SpO2: 98% 99%  96%  Weight:   77.6 kg (171 lb 1.2 oz)   Height:  5\' 10"  (1.778 m) 5\' 10"  (1.778 m)     Constitutional: NAD Eyes: No scleral icterus ENMT: Moist mucous membranes Respiratory: Crackles resolved, no wheezing.  Clear to auscultation now. Cardiovascular: Regular rate and rhythm, no murmurs.  1+ pitting lower extremity edema, 2+ pedal pulses. Abdomen: Soft, nontender, bowel sounds positive Skin: no rashes, lesions, ulcers. No induration Neurologic: No focal findings Psychiatric: Normal judgment and insight. Alert and oriented x 3. Normal mood.    Data Reviewed: I have independently reviewed following labs and imaging studies  CXR  CBC:  Recent Labs Lab 07/25/17 1000 07/26/17 0439 07/27/17 0409  WBC 10.9* 9.1 8.1  HGB 13.2 12.2* 12.5*  HCT 37.9* 35.1* 36.4*  MCV 93.3 93.9 92.9  PLT 287 228 301   Basic Metabolic Panel:  Recent Labs Lab 07/25/17 1000 07/26/17 0439 07/27/17 0409  NA 137 136 138  K 3.8 4.0 3.5  CL 99* 100* 99*  CO2 24 28 27   GLUCOSE 112* 79 81  BUN 27* 25* 27*  CREATININE 1.10 1.03 1.08  CALCIUM 9.3 8.7* 9.0   GFR: Estimated Creatinine Clearance: 46.9 mL/min (by C-G formula based on SCr of 1.08 mg/dL). Liver Function Tests:  Recent Labs Lab 07/26/17 0439  AST 51*  ALT 19  ALKPHOS 50  BILITOT 0.7  PROT 5.1*  ALBUMIN 2.6*   No results for input(s): LIPASE, AMYLASE in the last 168 hours. No results for input(s): AMMONIA in the last 168 hours. Coagulation Profile: No results for input(s): INR, PROTIME in the last 168 hours. Cardiac Enzymes:  Recent Labs Lab 07/26/17 1350  TROPONINI <0.03   BNP (last 3 results) No results for input(s): PROBNP in the last 8760 hours. HbA1C: No results for input(s): HGBA1C in the last 72 hours. CBG: No results for input(s): GLUCAP in the last 168 hours. Lipid  Profile: No results for input(s): CHOL, HDL, LDLCALC, TRIG, CHOLHDL, LDLDIRECT in the last 72 hours. Thyroid Function Tests:  Recent Labs  07/26/17 0439  TSH 1.698   Anemia Panel: No results for input(s): VITAMINB12, FOLATE, FERRITIN, TIBC, IRON, RETICCTPCT in the last 72 hours. Urine analysis:    Component Value Date/Time   LABSPEC 1.030 07/20/2017 1427   PHURINE 6.0 07/20/2017 1427   GLUCOSEU Negative 07/20/2017 1427   HGBUR Large 07/20/2017 1427   BILIRUBINUR Negative 07/20/2017 1427   KETONESUR Negative 07/20/2017 1427   PROTEINUR 300 07/20/2017 1427   UROBILINOGEN 0.2 07/20/2017 1427   NITRITE Negative 07/20/2017 1427   LEUKOCYTESUR Moderate 07/20/2017 1427   Sepsis Labs: Invalid input(s): PROCALCITONIN, LACTICIDVEN  Recent Results (from the past 240 hour(s))  Urine culture     Status: None   Collection Time: 07/20/17  2:27 PM  Result Value Ref Range Status  Urine Culture, Routine Final report  Final   Organism ID, Bacteria Comment  Final    Comment: Culture shows less than 10,000 colony forming units of bacteria per milliliter of urine. This colony count is not generally considered to be clinically significant.       Radiology Studies: Dg Chest 2 View  Result Date: 07/27/2017 CLINICAL DATA:  Exertional shortness of breath. History of non-Hodgkin's lymphoma, hypertension, coronary artery disease with stent placement, former smoker. EXAM: CHEST  2 VIEW COMPARISON:  Chest x-ray of July 25, 2017 and chest x-ray of December 03, 2016. FINDINGS: The lungs are borderline hypoinflated. There is a small left pleural effusion. There is increased density in the retrocardiac region on the left. There is a trace of pleural fluid on the right subtle increased interstitial density in the right upper lobe persists but is slightly less conspicuous. The heart and pulmonary vascularity are normal. There is tortuosity of the ascending thoracic aorta. There is calcification in the wall  of the aortic arch. 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 There is severe degenerative change of the right shoulder. There is multilevel degenerative disc disease of the thoracic spine. IMPRESSION: Small bilateral pleural effusions. Left basilar atelectasis or pneumonia. Slight interval improvement in interstitial infiltrate in the right upper lobe. Thoracic aortic atherosclerosis. Electronically Signed   By: David  Martinique M.D.   On: 07/27/2017 08:36     Scheduled Meds: . acetaminophen  325 mg Oral BID  . aspirin EC  162.5 mg Oral QPM  . atorvastatin  20 mg Oral q1800  . enoxaparin (LOVENOX) injection  40 mg Subcutaneous Q24H  . feeding supplement (ENSURE ENLIVE)  237 mL Oral BID BM  . furosemide  40 mg Intravenous BID  . multivitamin with minerals  1 tablet Oral Daily  . omega-3 acid ethyl esters  1 g Oral Daily  . tamsulosin  0.4 mg Oral QPC supper  . vitamin C  500 mg Oral Daily   Continuous Infusions: . azithromycin Stopped (07/26/17 1905)  . cefTRIAXone (ROCEPHIN)  IV Stopped (07/26/17 1700)     Marzetta Board, MD, PhD [facility] Pager [phone]  If 7PM-7AM, please contact night-coverage [email or web address] Password [email or web address] 07/27/2017, 12:10 PM

## 2017-07-28 ENCOUNTER — Telehealth: Payer: Self-pay | Admitting: *Deleted

## 2017-07-28 ENCOUNTER — Encounter: Payer: Self-pay | Admitting: *Deleted

## 2017-07-28 ENCOUNTER — Telehealth: Payer: Self-pay | Admitting: Radiation Oncology

## 2017-07-28 DIAGNOSIS — J181 Lobar pneumonia, unspecified organism: Secondary | ICD-10-CM

## 2017-07-28 LAB — BASIC METABOLIC PANEL
Anion gap: 11 (ref 5–15)
BUN: 28 mg/dL — AB (ref 6–20)
CALCIUM: 9.1 mg/dL (ref 8.9–10.3)
CHLORIDE: 99 mmol/L — AB (ref 101–111)
CO2: 28 mmol/L (ref 22–32)
CREATININE: 1.08 mg/dL (ref 0.61–1.24)
GFR, EST NON AFRICAN AMERICAN: 58 mL/min — AB (ref >60)
Glucose, Bld: 86 mg/dL (ref 65–99)
Potassium: 3.8 mmol/L (ref 3.5–5.1)
SODIUM: 138 mmol/L (ref 135–145)

## 2017-07-28 MED ORDER — AZITHROMYCIN 250 MG PO TABS
500.0000 mg | ORAL_TABLET | Freq: Every day | ORAL | Status: DC
  Administered 2017-07-28: 500 mg via ORAL
  Filled 2017-07-28: qty 2

## 2017-07-28 NOTE — Telephone Encounter (Signed)
Informed by Threasa Beards the patient and his daughter, Jackelyn Poling are requesting a return call to inquire about ct simulation. Phoned number provided and spoke with Debbie. Jackelyn Poling explains her father has been hospitalized since Saturday. She goes onto say he is weak, has pneumonia, shortness of breath and pleurisy. She reports her father is uncertain if he is strong enough at this time to undergo simulation for radiation therapy. Explained that prior to our staff bringing him down from room 1426 at [facility] we would call to see if he is up to it first. Explained if he is not up to it we could rescheduled. She verbalized understanding and expressed appreciation for the return call.

## 2017-07-28 NOTE — Telephone Encounter (Signed)
Patient's daughter calling to say patient is an inpt at [facility]. They are concerned, that patient has a scheduled infusion appt on sept 7th. This RN called patient, let him know that dr Alen Blew would be notified of admission upon his return to the office. Desk nurse will call patient with instructions from dr Alen Blew.

## 2017-07-28 NOTE — Care Management Important Message (Signed)
Important Message  Patient Details  Name: Derrick Rivera MRN: [id number] Date of Birth: Jul 24, 1924   Medicare Important Message Given:  Yes    Kerin Salen 07/28/2017, 10:28 [place] Message  Patient Details  Name: Derrick Rivera MRN: [id number] Date of Birth: [birth year]   Medicare Important Message Given:  Yes    Kerin Salen 07/28/2017, 10:28 AM

## 2017-07-28 NOTE — Progress Notes (Signed)
PROGRESS NOTE  Derrick Rivera WUJ:[id number] DOB: March 12, 1924 DOA: 07/25/2017 PCP: [name], MD   LOS: 3 days   Brief Narrative / Interim history: 83 year old male with history of CAD seen by Dr. [name] with cardiology, non-Hodgkin's lymphoma, urinary bladder cancer supposed to start chemotherapy in few weeks, was admitted to the hospital on 8/25 with progressive dyspnea on exertion and chest "heaviness" for the past couple weeks as well as bilateral lower extremity swelling.  He denies any chest pain. -8/27 cardiology consulted.  -8/28 continue IV diuresis, change to po 9/29  Assessment & Plan: Active Problems:   Essential hypertension   Coronary artery disease   Obstructive sleep apnea hypopnea, moderate   Hyperlipidemia LDL goal <70   Pneumonia   Diastolic CHF, chronic (HCC)   Acute on chronic diastolic CHF -most recent 2D echo in 2013 with normal EF. Patient reports progressive DOE and chest "heaviness" for the past 2 weeks and fluid build up in his legs. He has also been having orthopnea. No chest pain -2D echo done 8/26 shows normal ejection fraction of 60-65%, and grade 1 diastolic dysfunction -Continue IV Lasix, cardiology following, transition to p.o. Tomorrow -Dyspnea improving  Acute hypoxic respiratory failure -Requiring 2 L nasal cannula, probably due to #1 versus #3, wean off oxygen as tolerated  CAD -no chest pain, however DOE concerning, no ischemic evaluation per cardiology -continue Aspirin  Possible pneumonia -will treat but less likely patient without any fever/productive coughing -Continue antibiotics today  HTN -Getting diuresed, blood pressure is stable, hold home antihypertensives  High grade urothelial carcinoma, invasive -not a surgical candidate, seeing Dr. Alen Blew and will start chemotherapy soon -recently admitted July 2018 on Urology service and underwent cystoscopy with transuretral resection of bladder tumor and with a right ureteral  stent -outpatient management   BPH -on Flomax  HLD -on Lipitor   DVT prophylaxis: Lovenox Code Status: Full code Family Communication: No family at bedside, d/w daughter and wife 8/27 Disposition Plan: home when ready   Consultants:   Cardiology  Procedures:   2D echo Study Conclusions - Left ventricle: The cavity size was normal. Systolic function was normal. The estimated ejection fraction was in the range of 60% to 65%. Wall motion was normal; there were no regional wall motion abnormalities. Doppler parameters are consistent with abnormal left ventricular relaxation (grade 1 diastolic dysfunction). There was no evidence of elevated ventricular filling pressure by Doppler parameters. - Aortic valve: There was mild regurgitation. - Aortic root: The aortic root was normal in size. - Mitral valve: There was no regurgitation. - Right ventricle: The cavity size was mildly dilated. Wall thickness was normal. Systolic function was normal. - Tricuspid valve: There was trivial regurgitation. - Inferior vena cava: Not visualized.  Antimicrobials:  Ceftriaxone 8/25 >>  Azithromycin 8/25 >>   Subjective: -feels better, breathing better. No chest papin, no GI complaints  Objective: Vitals:   07/27/17 1353 07/27/17 2143 07/28/17 0631 07/28/17 0800  BP: 134/84 (!) 127/99 133/73 133/80  Pulse: (!) 106 (!) 111 85 82  Resp: 18 18 18 19   Temp: 98.2 F (36.8 C) 97.9 F (36.6 C) 97.9 F (36.6 C) 97.8 F (36.6 C)  TempSrc: Oral Oral Oral Oral  SpO2: 98% 99% 99% 98%  Weight:    78.1 kg (172 lb 1.6 oz)  Height:        Intake/Output Summary (Last 24 hours) at 07/28/17 1129 Last data filed at 07/28/17 0900  Gross per 24 hour  Intake  660 ml  Output              900 ml  Net             -240 ml   Filed Weights   07/27/17 0339 07/28/17 0800  Weight: 77.6 kg (171 lb 1.2 oz) 78.1 kg (172 lb 1.6 oz)    Examination:  Vitals:   07/27/17 1353 07/27/17 2143 07/28/17  0631 07/28/17 0800  BP: 134/84 (!) 127/99 133/73 133/80  Pulse: (!) 106 (!) 111 85 82  Resp: 18 18 18 19   Temp: 98.2 F (36.8 C) 97.9 F (36.6 C) 97.9 F (36.6 C) 97.8 F (36.6 C)  TempSrc: Oral Oral Oral Oral  SpO2: 98% 99% 99% 98%  Weight:    78.1 kg (172 lb 1.6 oz)  Height:        Constitutional: NAD Eyes: Lids and conjunctivae normal, no scleral icterus Respiratory: Crackles have not resolved, no wheezing.  Clear to auscultation bilaterally Cardiovascular: Regular rate and rhythm, no murmurs.  1+ pitting lower extremity edema. Abdomen: Soft, nontender, positive bowel sounds Neurologic: Nonfocal Psychiatric: Alert and oriented 3   Data Reviewed: I have independently reviewed following labs and imaging studies  CXR  CBC:  Recent Labs Lab 07/25/17 1000 07/26/17 0439 07/27/17 0409  WBC 10.9* 9.1 8.1  HGB 13.2 12.2* 12.5*  HCT 37.9* 35.1* 36.4*  MCV 93.3 93.9 92.9  PLT 287 228 086   Basic Metabolic Panel:  Recent Labs Lab 07/25/17 1000 07/26/17 0439 07/27/17 0409 07/28/17 0346  NA 137 136 138 138  K 3.8 4.0 3.5 3.8  CL 99* 100* 99* 99*  CO2 24 28 27 28   GLUCOSE 112* 79 81 86  BUN 27* 25* 27* 28*  CREATININE 1.10 1.03 1.08 1.08  CALCIUM 9.3 8.7* 9.0 9.1   GFR: Estimated Creatinine Clearance: 46.9 mL/min (by C-G formula based on SCr of 1.08 mg/dL). Liver Function Tests:  Recent Labs Lab 07/26/17 0439  AST 51*  ALT 19  ALKPHOS 50  BILITOT 0.7  PROT 5.1*  ALBUMIN 2.6*   No results for input(s): LIPASE, AMYLASE in the last 168 hours. No results for input(s): AMMONIA in the last 168 hours. Coagulation Profile: No results for input(s): INR, PROTIME in the last 168 hours. Cardiac Enzymes:  Recent Labs Lab 07/26/17 1350  TROPONINI <0.03   BNP (last 3 results) No results for input(s): PROBNP in the last 8760 hours. HbA1C: No results for input(s): HGBA1C in the last 72 hours. CBG: No results for input(s): GLUCAP in the last 168 hours. Lipid  Profile: No results for input(s): CHOL, HDL, LDLCALC, TRIG, CHOLHDL, LDLDIRECT in the last 72 hours. Thyroid Function Tests:  Recent Labs  07/26/17 0439  TSH 1.698   Anemia Panel: No results for input(s): VITAMINB12, FOLATE, FERRITIN, TIBC, IRON, RETICCTPCT in the last 72 hours. Urine analysis:    Component Value Date/Time   LABSPEC 1.030 07/20/2017 1427   PHURINE 6.0 07/20/2017 1427   GLUCOSEU Negative 07/20/2017 1427   HGBUR Large 07/20/2017 1427   BILIRUBINUR Negative 07/20/2017 1427   KETONESUR Negative 07/20/2017 1427   PROTEINUR 300 07/20/2017 1427   UROBILINOGEN 0.2 07/20/2017 1427   NITRITE Negative 07/20/2017 1427   LEUKOCYTESUR Moderate 07/20/2017 1427   Sepsis Labs: Invalid input(s): PROCALCITONIN, LACTICIDVEN  Recent Results (from the past 240 hour(s))  Urine culture     Status: None   Collection Time: 07/20/17  2:27 PM  Result Value Ref Range Status   Urine  Culture, Routine Final report  Final   Organism ID, Bacteria Comment  Final    Comment: Culture shows less than 10,000 colony forming units of bacteria per milliliter of urine. This colony count is not generally considered to be clinically significant.       Radiology Studies: Dg Chest 2 View  Result Date: 07/27/2017 CLINICAL DATA:  Exertional shortness of breath. History of non-Hodgkin's lymphoma, hypertension, coronary artery disease with stent placement, former smoker. EXAM: CHEST  2 VIEW COMPARISON:  Chest x-ray of July 25, 2017 and chest x-ray of December 03, 2016. FINDINGS: The lungs are borderline hypoinflated. There is a small left pleural effusion. There is increased density in the retrocardiac region on the left. There is a trace of pleural fluid on the right subtle increased interstitial density in the right upper lobe persists but is slightly less conspicuous. The heart and pulmonary vascularity are normal. There is tortuosity of the ascending thoracic aorta. There is calcification in the wall  of the aortic arch. 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 There is severe degenerative change of the right shoulder. There is multilevel degenerative disc disease of the thoracic spine. IMPRESSION: Small bilateral pleural effusions. Left basilar atelectasis or pneumonia. Slight interval improvement in interstitial infiltrate in the right upper lobe. Thoracic aortic atherosclerosis. Electronically Signed   By: David  Martinique M.D.   On: 07/27/2017 08:36     Scheduled Meds: . acetaminophen  325 mg Oral BID  . aspirin EC  162.5 mg Oral QPM  . atorvastatin  20 mg Oral q1800  . enoxaparin (LOVENOX) injection  40 mg Subcutaneous Q24H  . feeding supplement (ENSURE ENLIVE)  237 mL Oral BID BM  . furosemide  40 mg Intravenous BID  . multivitamin with minerals  1 tablet Oral Daily  . omega-3 acid ethyl esters  1 g Oral Daily  . tamsulosin  0.4 mg Oral QPC supper  . vitamin C  500 mg Oral Daily   Continuous Infusions: . azithromycin Stopped (07/27/17 1621)  . cefTRIAXone (ROCEPHIN)  IV Stopped (07/27/17 1421)     Marzetta Board, MD, PhD [facility] Pager [phone]  If 7PM-7AM, please contact night-coverage [email or web address] Password [facility] 07/28/2017, 11:29 AM

## 2017-07-28 NOTE — Evaluation (Signed)
Physical Therapy Evaluation Patient Details Name: Derrick Rivera MRN: [id number] DOB: [birth year] Today's Date: 07/28/2017   History of Present Illness  83 yo male with onset of PAN and non Hodgkins lymphoma was assessed for mobility with SOB and O2 use which was not PLOF.  Home with wife who cannot physically assist him, scheduled to start chemo and radiation for CA soon. PMHx:  CAD, stent, non hodgkins lymphoma, bladder CA, diverticulosis, skin CA, TIA's, HTN, colon polyps, OA, OSA,   Clinical Impression  Pt is weak, has elevated pulses sporadically and is fatigued with all effort.  He is appropriate to transition to SNF due to his inability to control mobility without signficiant help, but also with lack of appropriate assistance from family.  If this were to change or he becomes more independent could reconsider DC planning.  Will focus on strength and standing balance with RW to progress his care to more independent gait as he is able to tolerate.    Follow Up Recommendations SNF    Equipment Recommendations  None recommended by PT    Recommendations for Other Services       Precautions / Restrictions Precautions Precautions: Fall (telemetry) Precaution Comments: ck O2 sats and pulses Restrictions Weight Bearing Restrictions: No      Mobility  Bed Mobility Overal bed mobility: Needs Assistance Bed Mobility: Supine to Sit     Supine to sit: Min assist     General bed mobility comments: assisted trunk then to scoot hips to EOB  Transfers Overall transfer level: Needs assistance Equipment used: Rolling walker (2 wheeled);1 person hand held assist Transfers: Sit to/from Stand Sit to Stand: Mod assist         General transfer comment: needs help to power up and to remind of hand placement for safe transition  Ambulation/Gait Ambulation/Gait assistance: Min assist Ambulation Distance (Feet): 18 Feet Assistive device: Rolling walker (2 wheeled);1 person hand held  assist Gait Pattern/deviations: Step-through pattern;Step-to pattern;Decreased stride length;Narrow base of support;Trunk flexed Gait velocity: reduced Gait velocity interpretation: Below normal speed for age/gender General Gait Details: pt is mildly unsteady and on RW for safety, depending on the assistance with UE's  Stairs            Wheelchair Mobility    Modified Rankin (Stroke Patients Only)       Balance Overall balance assessment: Needs assistance Sitting-balance support: Bilateral upper extremity supported;Feet supported Sitting balance-Leahy Scale: Fair     Standing balance support: Bilateral upper extremity supported;During functional activity Standing balance-Leahy Scale: Poor Standing balance comment: pt is leaning on walker and noted his pulses were too high initially to walk at 139, but then down to 105                             Pertinent Vitals/Pain Pain Assessment: No/denies pain    Home Living Family/patient expects to be discharged to:: Unsure Living Arrangements: Spouse/significant other               Additional Comments: wife cannot help to stand pt    Prior Function Level of Independence: Needs assistance   Gait / Transfers Assistance Needed: recently assist needed to stand, using RW  ADL's / Homemaking Assistance Needed: Wife assists with house and pt gets assist with his care as needed  Comments: incontinent     Hand Dominance        Extremity/Trunk Assessment   Upper Extremity Assessment Upper Extremity  Assessment: Generalized weakness    Lower Extremity Assessment Lower Extremity Assessment: Generalized weakness    Cervical / Trunk Assessment Cervical / Trunk Assessment: Kyphotic  Communication   Communication: No difficulties  Cognition Arousal/Alertness: Awake/alert Behavior During Therapy: WFL for tasks assessed/performed Overall Cognitive Status: No family/caregiver present to determine baseline  cognitive functioning                                 General Comments: pt is struggling with sequencing some mobility and to follow instructions for safety      General Comments      Exercises     Assessment/Plan    PT Assessment Patient needs continued PT services  PT Problem List Decreased strength;Decreased range of motion;Decreased activity tolerance;Decreased balance;Decreased mobility;Decreased coordination;Decreased knowledge of use of DME;Decreased safety awareness;Cardiopulmonary status limiting activity       PT Treatment Interventions DME instruction;Gait training;Functional mobility training;Therapeutic activities;Therapeutic exercise;Balance training;Neuromuscular re-education;Patient/family education    PT Goals (Current goals can be found in the Care Plan section)  Acute Rehab PT Goals Patient Stated Goal: to get home safely PT Goal Formulation: With patient Time For Goal Achievement: 08/11/17 Potential to Achieve Goals: Good    Frequency Min 3X/week   Barriers to discharge Decreased caregiver support wife cannot assist standing    Co-evaluation               AM-PAC PT "6 Clicks" Daily Activity  Outcome Measure Difficulty turning over in bed (including adjusting bedclothes, sheets and blankets)?: Unable Difficulty moving from lying on back to sitting on the side of the bed? : Unable Difficulty sitting down on and standing up from a chair with arms (e.g., wheelchair, bedside commode, etc,.)?: Unable Help needed moving to and from a bed to chair (including a wheelchair)?: A Little Help needed walking in hospital room?: A Little Help needed climbing 3-5 steps with a railing? : A Lot 6 Click Score: 11    End of Session Equipment Utilized During Treatment: Gait belt;Oxygen (removed O2 for gait) Activity Tolerance: Patient tolerated treatment well;Other (comment) (able to maintain O2 sats with no supplemental O2) Patient left: in  chair;with call bell/phone within reach;with nursing/sitter in room Nurse Communication: Mobility status PT Visit Diagnosis: Unsteadiness on feet (R26.81);Muscle weakness (generalized) (M62.81);Difficulty in walking, not elsewhere classified (R26.2)    Time: 3762-8315 PT Time Calculation (min) (ACUTE ONLY): 28 min   Charges:   PT Evaluation $PT Eval Moderate Complexity: 1 Mod PT Treatments $Gait Training: 8-22 mins   PT G Codes:   PT G-Codes **NOT FOR INPATIENT CLASS** Functional Assessment Tool Used: AM-PAC 6 Clicks Basic Mobility    Ramond Dial 07/28/2017, 6:38 PM   Mee Hives, PT MS Acute Rehab Dept. Number: [place] [facility] [place]

## 2017-07-28 NOTE — Progress Notes (Signed)
Patient is 100% on Room Air at rest.  No c/o dyspnea, appears comfortable.  Patient up to chair for dinner.  Will continue to monitor.

## 2017-07-28 NOTE — Progress Notes (Signed)
Progress Note  Patient Name: Derrick Rivera Date of Encounter: 07/28/2017  Primary Cardiologist: [name]  Subjective   No chest pain still with some dyspnea when moving BP better   Inpatient Medications    Scheduled Meds: . acetaminophen  325 mg Oral BID  . aspirin EC  162.5 mg Oral QPM  . atorvastatin  20 mg Oral q1800  . enoxaparin (LOVENOX) injection  40 mg Subcutaneous Q24H  . feeding supplement (ENSURE ENLIVE)  237 mL Oral BID BM  . furosemide  40 mg Intravenous BID  . multivitamin with minerals  1 tablet Oral Daily  . omega-3 acid ethyl esters  1 g Oral Daily  . tamsulosin  0.4 mg Oral QPC supper  . vitamin C  500 mg Oral Daily   Continuous Infusions: . azithromycin Stopped (07/27/17 1621)  . cefTRIAXone (ROCEPHIN)  IV Stopped (07/27/17 1421)   PRN Meds: polyethylene glycol   Vital Signs    Vitals:   07/27/17 1353 07/27/17 2143 07/28/17 0631 07/28/17 0800  BP: 134/84 (!) 127/99 133/73 133/80  Pulse: (!) 106 (!) 111 85 82  Resp: 18 18 18 19   Temp: 98.2 F (36.8 C) 97.9 F (36.6 C) 97.9 F (36.6 C) 97.8 F (36.6 C)  TempSrc: Oral Oral Oral Oral  SpO2: 98% 99% 99% 98%  Weight:    172 lb 1.6 oz (78.1 kg)  Height:        Intake/Output Summary (Last 24 hours) at 07/28/17 0350 Last data filed at 07/27/17 2213  Gross per 24 hour  Intake              540 ml  Output              900 ml  Net             -360 ml   Filed Weights   07/27/17 0339 07/28/17 0800  Weight: 171 lb 1.2 oz (77.6 kg) 172 lb 1.6 oz (78.1 kg)    Telemetry    NSR - Personally Reviewed  ECG    SR nonspecific ST changes  - Personally Reviewed  Physical Exam  Elderly white male  GEN: No acute distress.   Neck: No JVD Cardiac: RRR, no murmurs, rubs, or gallops.  Respiratory: Clear to auscultation bilaterally. GI: Soft, nontender, non-distended  MS: No edema; No deformity. Neuro:  Nonfocal  Psych: Normal affect   Labs    Chemistry Recent Labs Lab 07/26/17 0439  07/27/17 0409 07/28/17 0346  NA 136 138 138  K 4.0 3.5 3.8  CL 100* 99* 99*  CO2 28 27 28   GLUCOSE 79 81 86  BUN 25* 27* 28*  CREATININE 1.03 1.08 1.08  CALCIUM 8.7* 9.0 9.1  PROT 5.1*  --   --   ALBUMIN 2.6*  --   --   AST 51*  --   --   ALT 19  --   --   ALKPHOS 50  --   --   BILITOT 0.7  --   --   GFRNONAA >60 58* 58*  GFRAA >60 >60 >60  ANIONGAP 8 12 11      Hematology Recent Labs Lab 07/25/17 1000 07/26/17 0439 07/27/17 0409  WBC 10.9* 9.1 8.1  RBC 4.06* 3.74* 3.92*  HGB 13.2 12.2* 12.5*  HCT 37.9* 35.1* 36.4*  MCV 93.3 93.9 92.9  MCH 32.5 32.6 31.9  MCHC 34.8 34.8 34.3  RDW 13.9 14.1 14.0  PLT 287 228 231    Cardiac Enzymes  Recent Labs Lab 07/26/17 1350  TROPONINI <0.03    Recent Labs Lab 07/25/17 0935  TROPIPOC 0.02     BNP Recent Labs Lab 07/25/17 0924  BNP 140.5*     DDimer No results for input(s): DDIMER in the last 168 hours.   Radiology    Dg Chest 2 View  Result Date: 07/27/2017 CLINICAL DATA:  Exertional shortness of breath. History of non-Hodgkin's lymphoma, hypertension, coronary artery disease with stent placement, former smoker. EXAM: CHEST  2 VIEW COMPARISON:  Chest x-ray of July 25, 2017 and chest x-ray of December 03, 2016. FINDINGS: The lungs are borderline hypoinflated. There is a small left pleural effusion. There is increased density in the retrocardiac region on the left. There is a trace of pleural fluid on the right subtle increased interstitial density in the right upper lobe persists but is slightly less conspicuous. The heart and pulmonary vascularity are normal. There is tortuosity of the ascending thoracic aorta. There is calcification in the wall of the aortic arch. 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 There is severe degenerative change of the right shoulder. There is multilevel degenerative disc disease of the thoracic spine. IMPRESSION: Small bilateral pleural effusions. Left basilar atelectasis or pneumonia.  Slight interval improvement in interstitial infiltrate in the right upper lobe. Thoracic aortic atherosclerosis. Electronically Signed   By: David  Martinique M.D.   On: 07/27/2017 08:36    Cardiac Studies   Echo 07/26/17 EF 60-65%  Myovue 07/2016 low risk small IMI and minimal peri infarct ischemia  Patient Profile     83 y.o. male with history CAD. Admitted with dyspnea and RUL pneumonia ? Component Of diastolic dysfunction Has bladder cancer and will need chemotherapy  Assessment & Plan    1) CAD known total occlusion of RCA with previous stenting and HSRA of LAD myovue a year Ago low risk. No chest pain r/o and no ECG changes resume beta blocker no need for inpatient  Stress testing  2) Diastolic CHF:  Not clear to me this is the cause of any dyspnea. He is not volume overloaded On exam lungs clear change to PO diuretics in am 3) Pneumonia CXR improved yesterday continue antibiotics  4) Bladder CA: no hematuria post biopsy and uretal stent has appt with oncology ? chemoRx  Will arrange outpatient f/u with Dr [name]  Signed, Jenkins Rouge, MD  07/28/2017, 9:38 AM

## 2017-07-28 NOTE — Progress Notes (Signed)
Attempted to place condom catheter on patient.  Unable to keep in place.  Patient prefers to wear his brief and states the brief was 'full of urine and very heavy' when he took it off this afternoon, and that he has voided multiple times.  He is incontinent and unable to use a urinal or bedside commode to maintain strict intake/output.

## 2017-07-28 NOTE — Progress Notes (Signed)
PHARMACIST - PHYSICIAN COMMUNICATION DR:  Cruzita Lederer CONCERNING: Antibiotic IV to Oral Route Change Policy  RECOMMENDATION: This patient is receiving Azithromycin by the intravenous route.  Based on criteria approved by the Pharmacy and Therapeutics Committee, the antibiotic(s) is/are being converted to the equivalent oral dose form(s).   DESCRIPTION: These criteria include:  Patient being treated for a respiratory tract infection, urinary tract infection, cellulitis or clostridium difficile associated diarrhea if on metronidazole  The patient is not neutropenic and does not exhibit a GI malabsorption state  The patient is eating (either orally or via tube) and/or has been taking other orally administered medications for a least 24 hours  The patient is improving clinically and has a Tmax < 100.5  If you have questions about this conversion, please contact the Pharmacy Department  []   [phone] )  [facility] []   [phone] )  [facility] []   [phone] )  [facility] []   [phone] )  [facility] [x]   [phone] )  [facility]     Lindell Spar, PharmD, [place] Pager: [phone] 07/28/2017 12:34 PM

## 2017-07-29 ENCOUNTER — Ambulatory Visit: Payer: Medicare HMO | Admitting: Physician Assistant

## 2017-07-29 LAB — CBC
HEMATOCRIT: 39 % (ref 39.0–52.0)
HEMOGLOBIN: 13.3 g/dL (ref 13.0–17.0)
MCH: 32.2 pg (ref 26.0–34.0)
MCHC: 34.1 g/dL (ref 30.0–36.0)
MCV: 94.4 fL (ref 78.0–100.0)
Platelets: 209 10*3/uL (ref 150–400)
RBC: 4.13 MIL/uL — AB (ref 4.22–5.81)
RDW: 14.1 % (ref 11.5–15.5)
WBC: 8.9 10*3/uL (ref 4.0–10.5)

## 2017-07-29 LAB — BASIC METABOLIC PANEL
ANION GAP: 13 (ref 5–15)
BUN: 26 mg/dL — ABNORMAL HIGH (ref 6–20)
CALCIUM: 9.2 mg/dL (ref 8.9–10.3)
CHLORIDE: 100 mmol/L — AB (ref 101–111)
CO2: 27 mmol/L (ref 22–32)
Creatinine, Ser: 1.04 mg/dL (ref 0.61–1.24)
GFR calc non Af Amer: >60 mL/min (ref >60)
GLUCOSE: 83 mg/dL (ref 65–99)
POTASSIUM: 3.8 mmol/L (ref 3.5–5.1)
Sodium: 140 mmol/L (ref 135–145)

## 2017-07-29 MED ORDER — ENSURE ENLIVE PO LIQD: 237.0000 mL | Freq: Two times a day (BID) | ORAL | 12 refills | Status: DC

## 2017-07-29 MED ORDER — AZITHROMYCIN 500 MG PO TABS: 500.0000 mg | ORAL_TABLET | Freq: Every day | ORAL | 0 refills | Status: DC

## 2017-07-29 MED ORDER — FUROSEMIDE 40 MG PO TABS: 40.0000 mg | ORAL_TABLET | Freq: Every day | ORAL | 0 refills | Status: DC

## 2017-07-29 MED ORDER — FUROSEMIDE 40 MG PO TABS
40.0000 mg | ORAL_TABLET | Freq: Every day | ORAL | Status: DC
  Administered 2017-07-29: 40 mg via ORAL
  Filled 2017-07-29: qty 1

## 2017-07-29 NOTE — Clinical Social Work Note (Signed)
Clinical Social Work Assessment  Patient Details  Name: Oneill Bais MRN: [id number] Date of Birth: 04/13/1924  Date of referral:  07/29/17               Reason for consult:  Facility Placement                Permission sought to share information with:  Case Manager Permission granted to share information::  Yes, Verbal Permission Granted  Name::        Agency::     Relationship::     Contact Information:     Housing/Transportation Living arrangements for the past 2 months:  Single Family Home Source of Information:  Patient, Adult Children, Spouse Patient Interpreter Needed:  None Criminal Activity/Legal Involvement Pertinent to Current Situation/Hospitalization:    Significant Relationships:  Adult Children, Spouse Lives with:  Spouse Do you feel safe going back to the place where you live?  Yes Need for family participation in patient care:  Yes (Comment)  Care giving concerns:  Patient from home with wife and plans to return home with home health. PT recommending SNF patient declined.   Social Worker assessment / plan:  CSW spoke with patient regarding PT recommendation for ST rehab for SNF. Patient reported that he feels safe discharging home to the care of his wife. Patient's wife reported that she feels comfortable caring for patient. Patient reported that he is interested in [facility]. CSW notified RNCM who agreed to follow up with patient and patient's family.  Employment status:  Retired Nurse, adult PT Recommendations:  [place], Home with [place] / Referral to community resources:  [place] (RNCM for [place])  Patient/Family's Response to care:  Patient declined SNF and is interested in home health.  Patient/Family's Understanding of and Emotional Response to Diagnosis, Current Treatment, and Prognosis:  Patient verbalized understanding of recommendation for ST rehab and reports that  he is interested in going home. Patient reported that he has support at home and feels safe returning home.  Emotional Assessment Appearance:  Appears stated age Attitude/Demeanor/Rapport:  Other (Open) Affect (typically observed):  Pleasant Orientation:  Oriented to Self, Oriented to Place, Oriented to  Time, Oriented to Situation Alcohol / Substance use:  Not Applicable Psych involvement (Current and /or in the community):  No (Comment)  Discharge Needs  Concerns to be addressed:  No discharge needs identified Readmission within the last 30 days:  Yes Current discharge risk:  None Barriers to Discharge:  No Barriers Identified   Burnis Medin, LCSW 07/29/2017, 2:19 PM

## 2017-07-29 NOTE — Discharge Summary (Signed)
[facility] Discharge Summary   Patient: Derrick Rivera JME:[id number]   PCP: Derrick Olp, MD DOB: Sep 12, 1924   Date of admission: 07/25/2017   Date of discharge: 07/29/2017    Discharge Diagnoses:  Active Problems:   Essential hypertension   Coronary artery disease   Obstructive sleep apnea hypopnea, moderate   Hyperlipidemia LDL goal <70   Pneumonia   Diastolic CHF, chronic ([place])   Admitted From: home Disposition:  Home with home health, patient refused SNF.  Recommendations for Outpatient Follow-up:  1. Follow-up with PCP in one week. 2. Follow-up with cardiology as recommended.   Follow-up Information    Troy Sine, MD Follow up on 08/05/2017.   Specialty:  Cardiology Why:  at 1:30 pm  his PA  [facility].   Contact information: [address]  [place] [address] [place] Follow up.   Why:  For [facility] service Contact information: [address] [place]       Derrick Olp, MD. Schedule an appointment as soon as possible for a visit in 1 week(s).   Specialty:  Family Medicine Contact information: [address] [place] [address] [phone]          Diet recommendation: Cardiac diet  Activity: The patient is advised to gradually reintroduce usual activities.  Discharge Condition: good  Code Status: Full code  History of present illness: As per the H and P dictated on admission, "Derrick Rivera is a 83 y.o. male with medical history significant of coronary artery disease history of stent in 2003 by Dr. Shelva Majestic, history of non-Hodgkin's lymphoma, recent diagnosis of cancer of the bladder urinary bladder followed by Dr. Tammi Klippel admitted with shortness of breath for the last 2 weeks. Patient came to the ER today because the breathing difficulty has been progressively getting worse. He reports that he has no appetite he is feeling weak he has  complaints of generalized weakness. He has lost some weight because of this reason he doesn't know how much weight he has lost. He lives with his wife at home. He also reports that he is on a new medication for bladder cancer looking back into his records it looks like he is on carboplatin. And he is due to get chemotherapy and radiation. She he denies any chest pain cough fever chills headaches changes with his vision nausea vomiting or diarrhea. He does report some constipation. Patient does have a history of smoking for 20 years from the age of 22 the age of 23. Patient does complain of orthopnea and dyspnea on exertion. Patient is not able to lay flat because of breathing difficulty so he has been sleeping with his head elevated. His oxygen saturation was 94% on room air in the ER however he sees when they placed oxygen on him he felt better."  Hospital Course:  Summary of his active problems in the hospital is as following. Acute on chronic diastolic CHF -most recent 2D echo in 2013 with normal EF. Patient reports progressive DOE and chest "heaviness" for the past 2 weeks and fluid build up in his legs. He has also been having orthopnea. No chest pain -2D echo done 8/26 shows normal ejection fraction of 60-65%, and grade 1 diastolic dysfunction -Continue IV Lasix, cardiology following, transition to p.o. -Dyspnea improving  Acute hypoxic respiratory failure -Requiring 2 L nasal cannula, probably due to #1 versus #3, wean off oxygen  as tolerated  CAD -no chest pain, however DOE concerning, no ischemic evaluation per cardiology -continue Aspirin  Possible pneumonia -will treat but less likely patient without any fever/productive coughing -Continue antibiotics   HTN -Getting diuresed, blood pressure is stable, hold home antihypertensives  High grade urothelial carcinoma, invasive -not a surgical candidate, seeing Dr. Alen Blew and will start chemotherapy soon -recently admitted July  2018 on Urology service and underwent cystoscopy with transuretral resection of bladder tumor and with a right ureteral stent -outpatient management   BPH -on Flomax  HLD -on Lipitor  All other chronic medical condition were stable during the hospitalization.  Patient was seen by physical therapy, who recommended SNF. Patient refused SNF and wanted to go home, home health which was arranged by Education officer, museum and case Freight forwarder. On the day of the discharge the patient's vitals were stable , and no other acute medical condition were reported by patient. the patient was felt safe to be discharge at home with home helath.  Procedures and Results:  2D echo Study Conclusions - Left ventricle: The cavity size was normal. Systolic function wasnormal. The estimated ejection fraction was in the range of 60%to 65%. Wall motion was normal; there were no regional wallmotion abnormalities. Doppler parameters are consistent withabnormal left ventricular relaxation (grade 1 diastolicdysfunction). There was no evidence of elevated ventricularfilling pressure by Doppler parameters. - Aortic valve: There was mild regurgitation. - Aortic root: The aortic root was normal in size. - Mitral valve: There was no regurgitation. - Right ventricle: The cavity size was mildly dilated. Wallthickness was normal. Systolic function was normal. - Tricuspid valve: There was trivial regurgitation.  - Inferior vena cava: Not visualized.   Consultations:  Cardiology  DISCHARGE MEDICATION: Discharge Medication List as of 07/29/2017  2:11 PM    START taking these medications   Details  azithromycin (ZITHROMAX) 500 MG tablet Take 1 tablet (500 mg total) by mouth daily., Starting Wed 07/29/2017, Until Sat 08/01/2017, Normal    feeding supplement, ENSURE ENLIVE, (ENSURE ENLIVE) LIQD Take 237 mLs by mouth 2 (two) times daily between meals., Starting Wed 07/29/2017, Normal    furosemide (LASIX) 40 MG tablet Take 1  tablet (40 mg total) by mouth daily., Starting Thu 07/30/2017, Normal      CONTINUE these medications which have NOT CHANGED   Details  acetaminophen (TYLENOL) 500 MG tablet Take 250 mg by mouth 2 (two) times daily., Historical Med    Ascorbic Acid (VITAMIN C) 500 MG tablet Take 500 mg by mouth daily. , Historical Med    aspirin EC 325 MG tablet Take 162.5 mg by mouth every evening. , Historical Med    atorvastatin (LIPITOR) 20 MG tablet TAKE 1 TABLET (20 MG TOTAL) BY MOUTH DAILY., Normal    Azelastine-Fluticasone 137-50 MCG/ACT SUSP Place 1 spray into the nose daily as needed., Starting Fri 01/23/2017, Normal    fish oil-omega-3 fatty acids 1000 MG capsule Take 1 g by mouth daily. Take one capsule by mouth daily, Historical Med    Multiple Vitamins-Minerals (MULTIVITAMIN WITH MINERALS) tablet Take one tablet by mouth every other day  In AM, Until Discontinued, Historical Med    prochlorperazine (COMPAZINE) 10 MG tablet Take 1 tablet (10 mg total) by mouth every 6 (six) hours as needed for nausea or vomiting., Starting Wed 07/15/2017, Normal    tamsulosin (FLOMAX) 0.4 MG CAPS capsule Take 1 capsule (0.4 mg total) by mouth daily after supper., Starting Mon 07/20/2017, Normal    traMADol (ULTRAM) 50 MG  tablet Take 1 tablet (50 mg total) by mouth every 4 (four) hours as needed., Starting Mon 06/29/2017, Until Tue 06/29/2018, Print    verapamil (CALAN-SR) 180 MG CR tablet Take 1 tablet (180 mg total) by mouth at bedtime., Starting Mon 02/16/2017, Normal      STOP taking these medications     atenolol (TENORMIN) 50 MG tablet      hydrochlorothiazide (MICROZIDE) 12.5 MG capsule      isosorbide mononitrate (IMDUR) 60 MG 24 hr tablet        Allergies  Allergen Reactions  . Penicillins Other (See Comments)    Did not help with strep throat--Unknown reaction  Has patient had a PCN reaction causing immediate rash, facial/tongue/throat swelling, SOB or lightheadedness with hypotension: No Has  patient had a PCN reaction causing severe rash involving mucus membranes or skin necrosis: No Has patient had a PCN reaction that required hospitalization: No Has patient had a PCN reaction occurring within the last 10 years: No If all of the above answers are "NO", then may proceed with Cephalosporin use.     Discharge Instructions    Diet - low sodium heart healthy    Complete by:  As directed    Discharge instructions    Complete by:  As directed    It is important that you read following instructions as well as go over your medication list with RN to help you understand your care after this hospitalization.  Discharge Instructions: Please follow-up with PCP in one week  Please request your primary care physician to go over all Hospital Tests and Procedure/Radiological results at the follow up,  Please get all Hospital records sent to your PCP by signing hospital release before you go home.   Do not take more than prescribed Pain, Sleep and Anxiety Medications. You were cared for by a hospitalist during your hospital stay. If you have any questions about your discharge medications or the care you received while you were in the hospital after you are discharged, you can call the unit and ask to speak with the hospitalist on call if the hospitalist that took care of you is not available.  Once you are discharged, your primary care physician will handle any further medical issues. Please note that NO REFILLS for any discharge medications will be authorized once you are discharged, as it is imperative that you return to your primary care physician (or establish a relationship with a primary care physician if you do not have one) for your aftercare needs so that they can reassess your need for medications and monitor your lab values. You Must read complete instructions/literature along with all the possible adverse reactions/side effects for all the Medicines you take and that have been  prescribed to you. Take any new Medicines after you have completely understood and accept all the possible adverse reactions/side effects. Wear Seat belts while driving. If you have smoked or chewed Tobacco in the last 2 yrs please stop smoking and/or stop any Recreational drug use.   Increase activity slowly    Complete by:  As directed      Discharge Exam: Filed Weights   07/27/17 0339 07/28/17 0800 07/29/17 0519  Weight: 77.6 kg (171 lb 1.2 oz) 78.1 kg (172 lb 1.6 oz) 75.6 kg (166 lb 10.7 oz)   Vitals:   07/29/17 1237 07/29/17 1316  BP: 114/82   Pulse: (!) 101   Resp: 18   Temp: 97.9 F (36.6 C)   SpO2: 98%  99%   General: Appear in no distress, no Rash; Oral Mucosa moist. Cardiovascular: S1 and S2 Present, no Murmur, no JVD Respiratory: Bilateral Air entry present and Clear to Auscultation, no Crackles, no wheezes Abdomen: Bowel Sound present, Soft and no tenderness Extremities: no Pedal edema, no calf tenderness Neurology: Grossly no focal neuro deficit.  The results of significant diagnostics from this hospitalization (including imaging, microbiology, ancillary and laboratory) are listed below for reference.    Significant Diagnostic Studies: Dg Chest 2 View  Result Date: 07/27/2017 CLINICAL DATA:  Exertional shortness of breath. History of non-Hodgkin's lymphoma, hypertension, coronary artery disease with stent placement, former smoker. EXAM: CHEST  2 VIEW COMPARISON:  Chest x-ray of July 25, 2017 and chest x-ray of December 03, 2016. FINDINGS: The lungs are borderline hypoinflated. There is a small left pleural effusion. There is increased density in the retrocardiac region on the left. There is a trace of pleural fluid on the right subtle increased interstitial density in the right upper lobe persists but is slightly less conspicuous. The heart and pulmonary vascularity are normal. There is tortuosity of the ascending thoracic aorta. There is calcification in the wall of the  aortic arch. 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 There is severe degenerative change of the right shoulder. There is multilevel degenerative disc disease of the thoracic spine. IMPRESSION: Small bilateral pleural effusions. Left basilar atelectasis or pneumonia. Slight interval improvement in interstitial infiltrate in the right upper lobe. Thoracic aortic atherosclerosis. Electronically Signed   By: David  Martinique M.D.   On: 07/27/2017 08:36   Dg Chest 2 View  Result Date: 07/25/2017 CLINICAL DATA:  Shortness of breath for 2 weeks. EXAM: CHEST  2 VIEW COMPARISON:  December 20, 2013 and December 03, 2016 FINDINGS: No pneumothorax. Increased haziness in the medial right upper lobe was not seen previously. There are small effusions with underlying atelectasis. The cardiomediastinal silhouette is stable with a tortuous thoracic aorta. No other acute abnormalities. IMPRESSION: 1. Small bilateral effusions, left greater than right, with underlying atelectasis. 2. Mild haziness in the right upper lobe is nonspecific but an early infiltrate is not excluded. Recommend follow-up to resolution. Electronically Signed   By: Dorise Bullion III M.D   On: 07/25/2017 09:30    Microbiology: Recent Results (from the past 240 hour(s))  Urine culture     Status: None   Collection Time: 07/20/17  2:27 PM  Result Value Ref Range Status   Urine Culture, Routine Final report  Final   Organism ID, Bacteria Comment  Final    Comment: Culture shows less than 10,000 colony forming units of bacteria per milliliter of urine. This colony count is not generally considered to be clinically significant.      Labs: CBC:  Recent Labs Lab 07/25/17 1000 07/26/17 0439 07/27/17 0409 07/29/17 0347  WBC 10.9* 9.1 8.1 8.9  HGB 13.2 12.2* 12.5* 13.3  HCT 37.9* 35.1* 36.4* 39.0  MCV 93.3 93.9 92.9 94.4  PLT 287 228 231 035   Basic Metabolic Panel:  Recent Labs Lab 07/25/17 1000 07/26/17 0439 07/27/17 0409  07/28/17 0346 07/29/17 0347  NA 137 136 138 138 140  K 3.8 4.0 3.5 3.8 3.8  CL 99* 100* 99* 99* 100*  CO2 24 28 27 28 27   GLUCOSE 112* 79 81 86 83  BUN 27* 25* 27* 28* 26*  CREATININE 1.10 1.03 1.08 1.08 1.04  CALCIUM 9.3 8.7* 9.0 9.1 9.2   Liver Function Tests:  Recent Labs Lab  07/26/17 0439  AST 51*  ALT 19  ALKPHOS 50  BILITOT 0.7  PROT 5.1*  ALBUMIN 2.6*   No results for input(s): LIPASE, AMYLASE in the last 168 hours. No results for input(s): AMMONIA in the last 168 hours. Cardiac Enzymes:  Recent Labs Lab 07/26/17 1350  TROPONINI <0.03   BNP (last 3 results)  Recent Labs  07/25/17 0924  BNP 140.5*   CBG: No results for input(s): GLUCAP in the last 168 hours. Time spent: 35 minutes  Signed:  Berle Mull  [facility] 07/29/2017  , 5:11 PM

## 2017-07-29 NOTE — Progress Notes (Signed)
Spoke with pt, wife and daughter Deby at bedside concerning [place] or SNF. Pt states he is going home. [facility] will follow pt at discharge. Referral given to in house rep.

## 2017-07-29 NOTE — Care Management Note (Signed)
Case Management Note  Patient Details  Name: Derrick Rivera MRN: [id number] Date of Birth: 23-Jul-1924  Subjective/Objective:                    Action/Plan:   Expected Discharge Date:                  Expected Discharge Plan:  [place]  In-House Referral:  Clinical Social Work  Discharge planning Services  CM Consult  Post Acute Care Choice:    Choice offered to:     DME Arranged:    DME Agency:     HH Arranged:  RN, PT, NA HH Agency:  [place]  Status of Service:  Completed, signed off  If discussed at [place] of Stay Meetings, dates discussed:    Additional CommentsPurcell Mouton, RN 07/29/2017, 11:40 AM

## 2017-07-29 NOTE — Progress Notes (Signed)
    Hospital follow-up appointment arranged for August 05, 2017 at 1:30 PM with Billey Chang, PA-C at the [facility].  Delos Haring, PA-C 07/29/17 8:45AM

## 2017-07-30 ENCOUNTER — Telehealth: Payer: Self-pay

## 2017-07-30 ENCOUNTER — Telehealth: Payer: Self-pay | Admitting: Family Medicine

## 2017-07-30 ENCOUNTER — Ambulatory Visit
Admission: RE | Admit: 2017-07-30 | Discharge: 2017-07-30 | Disposition: A | Discharge status: Home / Self Care | Payer: Medicare HMO | Source: Ambulatory Visit | Attending: Radiation Oncology | Admitting: Radiation Oncology

## 2017-07-30 ENCOUNTER — Telehealth: Payer: Self-pay | Admitting: Radiation Oncology

## 2017-07-30 DIAGNOSIS — Z800 Family history of malignant neoplasm of digestive organs: Secondary | ICD-10-CM | POA: Diagnosis not present

## 2017-07-30 DIAGNOSIS — I2582 Chronic total occlusion of coronary artery: Secondary | ICD-10-CM | POA: Diagnosis not present

## 2017-07-30 DIAGNOSIS — C672 Malignant neoplasm of lateral wall of bladder: Secondary | ICD-10-CM

## 2017-07-30 DIAGNOSIS — G4733 Obstructive sleep apnea (adult) (pediatric): Secondary | ICD-10-CM | POA: Diagnosis not present

## 2017-07-30 DIAGNOSIS — Z955 Presence of coronary angioplasty implant and graft: Secondary | ICD-10-CM | POA: Diagnosis not present

## 2017-07-30 DIAGNOSIS — M1990 Unspecified osteoarthritis, unspecified site: Secondary | ICD-10-CM | POA: Diagnosis not present

## 2017-07-30 DIAGNOSIS — Z510 Encounter for antineoplastic radiation therapy: Secondary | ICD-10-CM | POA: Diagnosis not present

## 2017-07-30 DIAGNOSIS — Z9889 Other specified postprocedural states: Secondary | ICD-10-CM | POA: Diagnosis not present

## 2017-07-30 DIAGNOSIS — I2510 Atherosclerotic heart disease of native coronary artery without angina pectoris: Secondary | ICD-10-CM | POA: Diagnosis not present

## 2017-07-30 DIAGNOSIS — Z96652 Presence of left artificial knee joint: Secondary | ICD-10-CM | POA: Diagnosis not present

## 2017-07-30 DIAGNOSIS — Z8572 Personal history of non-Hodgkin lymphomas: Secondary | ICD-10-CM | POA: Diagnosis not present

## 2017-07-30 DIAGNOSIS — R319 Hematuria, unspecified: Secondary | ICD-10-CM | POA: Diagnosis not present

## 2017-07-30 DIAGNOSIS — Z87891 Personal history of nicotine dependence: Secondary | ICD-10-CM | POA: Diagnosis not present

## 2017-07-30 DIAGNOSIS — Z8673 Personal history of transient ischemic attack (TIA), and cerebral infarction without residual deficits: Secondary | ICD-10-CM | POA: Diagnosis not present

## 2017-07-30 DIAGNOSIS — Z8249 Family history of ischemic heart disease and other diseases of the circulatory system: Secondary | ICD-10-CM | POA: Diagnosis not present

## 2017-07-30 DIAGNOSIS — Z79891 Long term (current) use of opiate analgesic: Secondary | ICD-10-CM | POA: Diagnosis not present

## 2017-07-30 DIAGNOSIS — Z79899 Other long term (current) drug therapy: Secondary | ICD-10-CM | POA: Diagnosis not present

## 2017-07-30 DIAGNOSIS — R590 Localized enlarged lymph nodes: Secondary | ICD-10-CM | POA: Diagnosis not present

## 2017-07-30 DIAGNOSIS — Z9841 Cataract extraction status, right eye: Secondary | ICD-10-CM | POA: Diagnosis not present

## 2017-07-30 DIAGNOSIS — E7800 Pure hypercholesterolemia, unspecified: Secondary | ICD-10-CM | POA: Diagnosis not present

## 2017-07-30 DIAGNOSIS — Z96619 Presence of unspecified artificial shoulder joint: Secondary | ICD-10-CM | POA: Diagnosis not present

## 2017-07-30 DIAGNOSIS — Z85828 Personal history of other malignant neoplasm of skin: Secondary | ICD-10-CM | POA: Diagnosis not present

## 2017-07-30 DIAGNOSIS — I10 Essential (primary) hypertension: Secondary | ICD-10-CM | POA: Diagnosis not present

## 2017-07-30 DIAGNOSIS — Z7982 Long term (current) use of aspirin: Secondary | ICD-10-CM | POA: Diagnosis not present

## 2017-07-30 NOTE — Telephone Encounter (Signed)
DOD Dr.Hochrein requested triage to call patient to check on his condition he was discharged from [facility] yesterday 07/29/17. Spoke to patient he stated he was admitted to [facility] hospital 07/25/17 with sob.Stated he was discharged yesterday.Stated he was sob when he was discharged.Stated O2 sat was normal on discharge.Stated he notices sob when he sits down or lays down.Stated hard to sleep last night.Stated his lower abdomen bloated and tight.No appetite,feels nausea.Spoke to Dr.Hochrein he advised schedule appointment today or tomorrow.Appointment scheduled with Kerin Ransom PA 07/31/17 at 2:00 pm.

## 2017-07-30 NOTE — Telephone Encounter (Signed)
Patient called c/o SOB, he was d/c from the hospital on 07/29/17.  Spoke with Dr Yong Channel and he advise to contact [facility] due to pt's dx of Diastolic CHF.  Spoke with Nayla from [facility] she stated she would call pt and attempt to schedule pt.

## 2017-07-30 NOTE — Telephone Encounter (Signed)
Phoned patient's home. Spoke with daughter, Deby. Deby confirms their intentions to present for simulation appointment at 1300 today. Informed Aaron Edelman, RT in [place] of this finding.

## 2017-07-30 NOTE — Telephone Encounter (Signed)
Pt thinks he was speaking to Dr Yong Channel nurse and she was discussing with Dr Yong Channel about his issue.  Pt states he was put on hold. Did you speak with pt? Please call back on the home phone. Thank youi!

## 2017-07-30 NOTE — Telephone Encounter (Signed)
Patient spoke with Derrick Rivera earlier according to note in chart. Looks like he was going to be followed by [facility]

## 2017-07-30 NOTE — Progress Notes (Signed)
  Radiation Oncology         [phone] ________________________________  Name: Derrick Rivera MRN: [id number]  Date: 07/30/2017  DOB: 1924/10/22  SIMULATION AND TREATMENT PLANNING NOTE    ICD-10-CM   1. Malignant neoplasm of lateral wall of urinary bladder (HCC) C67.2     DIAGNOSIS:  83 yo man with localized muscle invasive bladder cancer  NARRATIVE:  The patient was brought to the [facility].  Identity was confirmed.  All relevant records and images related to the planned course of therapy were reviewed.  The patient freely provided informed written consent to proceed with treatment after reviewing the details related to the planned course of therapy. The consent form was witnessed and verified by the simulation staff.  Then, the patient was set-up in a stable reproducible  supine position for radiation therapy.  CT images were obtained.  Surface markings were placed.  The CT images were loaded into the planning software.  Then the target and avoidance structures were contoured.  Treatment planning then occurred.  The radiation prescription was entered and confirmed.  Then, I designed and supervised the construction of a total of 5 medically necessary complex treatment devices including VacLoc body positioner and 4 MLCs to shield the bowel and femoral necks.  I have requested : 3D Simulation  I have requested a DVH of the following structures: small bowel, rectum, left femoral head, right femoral head and targets.  SPECIAL TREATMENT PROCEDURE:  The planned course of therapy using radiation constitutes a special treatment procedure. Special care is required in the management of this patient for the following reasons. This treatment constitutes a Special Treatment Procedure for the following reason: [ Concurrent chemotherapy requiring careful monitoring for increased toxicities of treatment including weekly laboratory values..  The special nature of the planned course of  radiotherapy will require increased physician supervision and oversight to ensure patient's safety with optimal treatment outcomes.   PLAN:  The patient will receive 45 Gy in 25 fractions to the bladder and pelvic nodes, followed by a boost to the bladder tumor to a total dose of 64.8 Gy.  ________________________________  Sheral Apley Tammi Klippel, M.D.  This document serves as a record of services personally performed by Tyler Pita, MD. It was created on his behalf by Arlyce Harman, a trained medical scribe. The creation of this record is based on the scribe's personal observations and the provider's statements to them. This document has been checked and approved by the attending provider.

## 2017-07-31 ENCOUNTER — Encounter: Payer: Self-pay | Admitting: Cardiology

## 2017-07-31 ENCOUNTER — Ambulatory Visit (INDEPENDENT_AMBULATORY_CARE_PROVIDER_SITE_OTHER): Payer: Medicare HMO | Admitting: Cardiology

## 2017-07-31 ENCOUNTER — Telehealth: Payer: Self-pay

## 2017-07-31 VITALS — BP 115/76 | Ht 70.0 in | Wt 162.0 lb

## 2017-07-31 DIAGNOSIS — I2510 Atherosclerotic heart disease of native coronary artery without angina pectoris: Secondary | ICD-10-CM | POA: Diagnosis not present

## 2017-07-31 DIAGNOSIS — M19011 Primary osteoarthritis, right shoulder: Secondary | ICD-10-CM | POA: Diagnosis not present

## 2017-07-31 DIAGNOSIS — C679 Malignant neoplasm of bladder, unspecified: Secondary | ICD-10-CM | POA: Diagnosis not present

## 2017-07-31 DIAGNOSIS — I110 Hypertensive heart disease with heart failure: Secondary | ICD-10-CM | POA: Diagnosis not present

## 2017-07-31 DIAGNOSIS — R0602 Shortness of breath: Secondary | ICD-10-CM | POA: Diagnosis not present

## 2017-07-31 DIAGNOSIS — R1900 Intra-abdominal and pelvic swelling, mass and lump, unspecified site: Secondary | ICD-10-CM | POA: Diagnosis not present

## 2017-07-31 DIAGNOSIS — C678 Malignant neoplasm of overlapping sites of bladder: Secondary | ICD-10-CM | POA: Diagnosis not present

## 2017-07-31 DIAGNOSIS — I5033 Acute on chronic diastolic (congestive) heart failure: Secondary | ICD-10-CM | POA: Diagnosis not present

## 2017-07-31 NOTE — Assessment & Plan Note (Signed)
Seen in the office early post hospital secondary to continued SOB

## 2017-07-31 NOTE — Telephone Encounter (Signed)
Attempted to reach patient to complete TCM follow-up call. Unable to reach patient. Message left on voicemail asking patient to return call to office.   

## 2017-07-31 NOTE — Assessment & Plan Note (Signed)
Followed by Dr Alyson Ingles- bilateral ureteral stents placed 06/29/17

## 2017-07-31 NOTE — Progress Notes (Signed)
07/31/2017 Derrick Rivera   06-13-1924  [id number]  Primary Physician Yong Channel Brayton Mars, MD Primary Cardiologist: Dr [name]  HPI:  83 y/o male followed by Dr [name] with a history of stable CAD. He has high grade bladder cancer and has had several procedures by Dr Reed Pandy, the last being bilateral ureteral stents 06/29/17. He presented to the ED 07/25/17 with SOB. He was seen by cardiology and admitted by the hospital service. His BNP was minimally elevated-145. His CXR showed infiltrates. He was treated for CHF and PNA and discharged 8/29. His echo done 07/26/17 showed normal LVF- his LVEDP was not elevated.  After discharge he connie to complain of SOB. He was added to my schedule today.  He presents to the office with his daughter. The pt tells me he can't do anything with getting SOB. When questioned further he actually is unable to take a deep breath. He says his abdomin is swollen and he has not had a bowel movement in 3 days. He is actually best laying down which clearly argues against CHF. On exam he has a firm mid abdominal mass that is tender to palpation. It feels fairly fixed. In reviewing Dr Hazeline Junker notes the pt apparently had an abdominal CT 06/25/17 which showed "diffusely calcified soft tissue density in the central small bowel mesentery which shows no significant changes. There is mild noncalcified retroperitoneal lymphadenopathy in the left periaortic and aortocaval space with the largest measuring 1.4 cm compared to 0.9 previously. No other lymphadenopathy noted or other sign of metastasis".    Current Outpatient Prescriptions  Medication Sig Dispense Refill  . acetaminophen (TYLENOL) 500 MG tablet Take 250 mg by mouth 2 (two) times daily.    . Ascorbic Acid (VITAMIN C) 500 MG tablet Take 500 mg by mouth daily.     Marland Kitchen aspirin EC 325 MG tablet Take 162.5 mg by mouth every evening.     Marland Kitchen atorvastatin (LIPITOR) 20 MG tablet TAKE 1 TABLET (20 MG TOTAL) BY MOUTH DAILY. (Patient taking  differently: TAKE 1 TABLET (20 MG TOTAL) BY MOUTH DAILY AT NIGHT) 90 tablet 3  . Azelastine-Fluticasone 137-50 MCG/ACT SUSP Place 1 spray into the nose daily as needed. 23 g 2  . feeding supplement, ENSURE ENLIVE, (ENSURE ENLIVE) LIQD Take 237 mLs by mouth 2 (two) times daily between meals. 237 mL 12  . furosemide (LASIX) 40 MG tablet Take 1 tablet (40 mg total) by mouth daily. 30 tablet 0  . prochlorperazine (COMPAZINE) 10 MG tablet Take 1 tablet (10 mg total) by mouth every 6 (six) hours as needed for nausea or vomiting. 30 tablet 0  . tamsulosin (FLOMAX) 0.4 MG CAPS capsule Take 1 capsule (0.4 mg total) by mouth daily after supper. 30 capsule 2  . traMADol (ULTRAM) 50 MG tablet Take 1 tablet (50 mg total) by mouth every 4 (four) hours as needed. 30 tablet 0  . verapamil (CALAN-SR) 180 MG CR tablet Take 1 tablet (180 mg total) by mouth at bedtime. 30 tablet 11   No current facility-administered medications for this visit.     Allergies  Allergen Reactions  . Penicillins Other (See Comments)    Did not help with strep throat--Unknown reaction  Has patient had a PCN reaction causing immediate rash, facial/tongue/throat swelling, SOB or lightheadedness with hypotension: No Has patient had a PCN reaction causing severe rash involving mucus membranes or skin necrosis: No Has patient had a PCN reaction that required hospitalization: No Has patient had a PCN  reaction occurring within the last 10 years: No If all of the above answers are "NO", then may proceed with Cephalosporin use.      Past Medical History:  Diagnosis Date  . Allergic rhinitis   . Bilateral carotid artery stenosis    per last duplex [id number]  RICA 82-50%,  LICA 5-39%  . Bladder cancer [facility]) urologist-  dr Alyson Ingles   Non-invasise High Grade  . Chronic cough    ? lisinopril intolerence  . Coronary artery disease cardiologiat-  dr Shelva Majestic   hx PCI and stenting 2003/  per cardiac cath total occlusion RCA w/  collaterals 1995  . Diverticulosis of colon   . History of adenomatous polyp of colon    tubular adenoma's 1992; 1993; 1997; 2007  . History of colonic polyps   . History of nonmelanoma skin cancer    BCC and SCC right side nose  s/p moh's reseciton and reconstruction 2013/  left ear  01/ 2017/   06/ 2017 in office excision 2 areas on scalp  . History of TIAs takes ASA   pt states has a few what he thinks ia tia's, states last time with symptoms of veritgo and thought process was 04/ 2017  . HTN (hypertension)   . Hypercholesterolemia   . Non-Hodgkin lymphoma, unspecified, unspecified site [facility]) oncologist-  dr Benay Spice--  pt in clinical remission since 2000   dx 1996  no specific therapy since 2000  . OA (osteoarthritis)   . OSA (obstructive sleep apnea)    moderate per study 08-10-2013--  intolerant cpap  . Prostate nodule   . Thrombocytopenia (HCC)    chronic mild  . Wears glasses     Social History   Social History  . Marital status: Married    Spouse name: Statistician  . Number of children: 3  . Years of education: N/A   Occupational History  . retired-sales Chief Financial Officer    Social History Main Topics  . Smoking status: Former Smoker    Packs/day: 2.00    Years: 20.00    Types: Cigarettes    Quit date: 12/01/1966  . Smokeless tobacco: Never Used  . Alcohol use No  . Drug use: No  . Sexual activity: No   Other Topics Concern  . Not on file   Social History Narrative   Married 61 years in 2015. 3 kids. 5 grandkids.       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 Pancreatic cancer Mother   . Heart disease Father      Review of Systems: General: negative for chills, fever, night sweats or  weight changes.  Cardiovascular: negative for chest pain, dyspnea on exertion, edema, orthopnea, palpitations, paroxysmal nocturnal dyspnea or shortness of breath Dermatological: negative for rash Respiratory: negative for cough or wheezing Urologic: negative for hematuria Abdominal: negative for nausea, vomiting, diarrhea, bright red blood per rectum, melena, or hematemesis Neurologic: negative for visual changes, syncope, or dizziness All other systems reviewed and are otherwise negative except as noted above.    Blood pressure 115/76, height 5\' 10"  (1.778 m), weight 162 lb (73.5 kg), SpO2 97 %.  General appearance: alert, cooperative, appears stated  age and no distress Neck: no JVD Lungs: clear to auscultation bilaterally Heart: regular rate and rhythm Abdomen: firm, mid abdominal tender mass Extremities: no edema Skin: plae cool dry Neurologic: Grossly normal   ASSESSMENT AND PLAN:   SOB (shortness of breath) Seen in the office early post hospital secondary to continued SOB  Bladder cancer [facility]) Followed by Dr Alyson Ingles- bilateral ureteral stents placed 06/29/17  Coronary artery disease Occluded RCA in '95, PCI stent to LAD '03 Myoview low risk Aug 2017   PLAN  The pt was seen by Dr Debara Pickett and myself. I think his SOB is actually a mechanical issue as opposed to CHF. We suggested the pt resume his Miralax. We have ordered an abdominal CT with contrast. The direction of further follow up will hinge on these results.   Kerin Ransom PA-C 07/31/2017 4:32 PM

## 2017-07-31 NOTE — Patient Instructions (Addendum)
Medication Instructions: Your physician recommends that you continue on your current medications as directed. Please refer to the Current Medication list given to you today.  If you need a refill on your cardiac medications before your next appointment, please call your pharmacy.    Procedures/Testing: A CT of the abdomen has been ordered. Non-Cardiac CT scanning, (CAT scanning), is a noninvasive, special x-ray that produces cross-sectional images of the body using x-rays and a computer. CT scans help physicians diagnose and treat medical conditions. For some CT exams, a contrast material is used to enhance visibility in the area of the body being studied. CT scans provide greater clarity and reveal more details than regular x-ray exams. This will be done at [facility].  Please have the [facility] lab drawn today.  Follow-Up: Your physician wants you to keep your follow up with Almyra Deforest, PA on 08/06/2107    Thank you for choosing [facility] at [facility]!!

## 2017-07-31 NOTE — Assessment & Plan Note (Signed)
Occluded RCA in '95, PCI stent to LAD '03 Myoview low risk Aug 2017

## 2017-08-01 ENCOUNTER — Ambulatory Visit (HOSPITAL_BASED_OUTPATIENT_CLINIC_OR_DEPARTMENT_OTHER): Payer: Medicare HMO

## 2017-08-01 LAB — D-DIMER, QUANTITATIVE: D-DIMER: 1.03 mg/L FEU — ABNORMAL HIGH (ref 0.00–0.49)

## 2017-08-02 ENCOUNTER — Encounter (HOSPITAL_COMMUNITY): Payer: Self-pay | Admitting: Nurse Practitioner

## 2017-08-02 ENCOUNTER — Inpatient Hospital Stay (HOSPITAL_COMMUNITY)
Admission: EM | Admit: 2017-08-02 | Discharge: 2017-08-05 | DRG: 872 | Disposition: A | Discharge status: Hospice | Payer: Medicare HMO | Attending: Internal Medicine | Admitting: Internal Medicine

## 2017-08-02 ENCOUNTER — Emergency Department (HOSPITAL_COMMUNITY): Payer: Medicare HMO

## 2017-08-02 ENCOUNTER — Other Ambulatory Visit: Payer: Self-pay

## 2017-08-02 DIAGNOSIS — R404 Transient alteration of awareness: Secondary | ICD-10-CM | POA: Diagnosis not present

## 2017-08-02 DIAGNOSIS — C678 Malignant neoplasm of overlapping sites of bladder: Secondary | ICD-10-CM | POA: Diagnosis not present

## 2017-08-02 DIAGNOSIS — N400 Enlarged prostate without lower urinary tract symptoms: Secondary | ICD-10-CM | POA: Diagnosis present

## 2017-08-02 DIAGNOSIS — Z8673 Personal history of transient ischemic attack (TIA), and cerebral infarction without residual deficits: Secondary | ICD-10-CM

## 2017-08-02 DIAGNOSIS — E872 Acidosis: Secondary | ICD-10-CM | POA: Diagnosis present

## 2017-08-02 DIAGNOSIS — Z515 Encounter for palliative care: Secondary | ICD-10-CM

## 2017-08-02 DIAGNOSIS — Z87891 Personal history of nicotine dependence: Secondary | ICD-10-CM

## 2017-08-02 DIAGNOSIS — R0600 Dyspnea, unspecified: Secondary | ICD-10-CM | POA: Diagnosis not present

## 2017-08-02 DIAGNOSIS — Z85828 Personal history of other malignant neoplasm of skin: Secondary | ICD-10-CM | POA: Diagnosis not present

## 2017-08-02 DIAGNOSIS — N390 Urinary tract infection, site not specified: Secondary | ICD-10-CM | POA: Diagnosis present

## 2017-08-02 DIAGNOSIS — Z7401 Bed confinement status: Secondary | ICD-10-CM | POA: Diagnosis not present

## 2017-08-02 DIAGNOSIS — I5032 Chronic diastolic (congestive) heart failure: Secondary | ICD-10-CM | POA: Diagnosis present

## 2017-08-02 DIAGNOSIS — R627 Adult failure to thrive: Secondary | ICD-10-CM | POA: Diagnosis present

## 2017-08-02 DIAGNOSIS — Z96651 Presence of right artificial knee joint: Secondary | ICD-10-CM | POA: Diagnosis present

## 2017-08-02 DIAGNOSIS — Z8572 Personal history of non-Hodgkin lymphomas: Secondary | ICD-10-CM

## 2017-08-02 DIAGNOSIS — I10 Essential (primary) hypertension: Secondary | ICD-10-CM | POA: Diagnosis not present

## 2017-08-02 DIAGNOSIS — Z7982 Long term (current) use of aspirin: Secondary | ICD-10-CM | POA: Diagnosis not present

## 2017-08-02 DIAGNOSIS — Z8551 Personal history of malignant neoplasm of bladder: Secondary | ICD-10-CM | POA: Diagnosis not present

## 2017-08-02 DIAGNOSIS — Z66 Do not resuscitate: Secondary | ICD-10-CM | POA: Diagnosis not present

## 2017-08-02 DIAGNOSIS — G4733 Obstructive sleep apnea (adult) (pediatric): Secondary | ICD-10-CM | POA: Diagnosis present

## 2017-08-02 DIAGNOSIS — A419 Sepsis, unspecified organism: Principal | ICD-10-CM | POA: Diagnosis present

## 2017-08-02 DIAGNOSIS — I2510 Atherosclerotic heart disease of native coronary artery without angina pectoris: Secondary | ICD-10-CM | POA: Diagnosis present

## 2017-08-02 DIAGNOSIS — Z96612 Presence of left artificial shoulder joint: Secondary | ICD-10-CM | POA: Diagnosis present

## 2017-08-02 DIAGNOSIS — Z955 Presence of coronary angioplasty implant and graft: Secondary | ICD-10-CM

## 2017-08-02 DIAGNOSIS — R531 Weakness: Secondary | ICD-10-CM | POA: Diagnosis not present

## 2017-08-02 DIAGNOSIS — E876 Hypokalemia: Secondary | ICD-10-CM | POA: Diagnosis present

## 2017-08-02 DIAGNOSIS — N1330 Unspecified hydronephrosis: Secondary | ICD-10-CM | POA: Diagnosis not present

## 2017-08-02 DIAGNOSIS — I110 Hypertensive heart disease with heart failure: Secondary | ICD-10-CM | POA: Diagnosis present

## 2017-08-02 DIAGNOSIS — E785 Hyperlipidemia, unspecified: Secondary | ICD-10-CM | POA: Diagnosis present

## 2017-08-02 DIAGNOSIS — R7989 Other specified abnormal findings of blood chemistry: Secondary | ICD-10-CM | POA: Diagnosis present

## 2017-08-02 DIAGNOSIS — E8770 Fluid overload, unspecified: Secondary | ICD-10-CM | POA: Diagnosis not present

## 2017-08-02 DIAGNOSIS — Z880 Allergy status to penicillin: Secondary | ICD-10-CM

## 2017-08-02 DIAGNOSIS — R0602 Shortness of breath: Secondary | ICD-10-CM | POA: Diagnosis not present

## 2017-08-02 DIAGNOSIS — J90 Pleural effusion, not elsewhere classified: Secondary | ICD-10-CM | POA: Diagnosis not present

## 2017-08-02 DIAGNOSIS — Z9889 Other specified postprocedural states: Secondary | ICD-10-CM | POA: Diagnosis not present

## 2017-08-02 DIAGNOSIS — Z7189 Other specified counseling: Secondary | ICD-10-CM

## 2017-08-02 DIAGNOSIS — R609 Edema, unspecified: Secondary | ICD-10-CM

## 2017-08-02 DIAGNOSIS — N179 Acute kidney failure, unspecified: Secondary | ICD-10-CM | POA: Diagnosis not present

## 2017-08-02 DIAGNOSIS — J910 Malignant pleural effusion: Secondary | ICD-10-CM | POA: Diagnosis not present

## 2017-08-02 DIAGNOSIS — C679 Malignant neoplasm of bladder, unspecified: Secondary | ICD-10-CM | POA: Diagnosis present

## 2017-08-02 DIAGNOSIS — C778 Secondary and unspecified malignant neoplasm of lymph nodes of multiple regions: Secondary | ICD-10-CM | POA: Diagnosis not present

## 2017-08-02 DIAGNOSIS — C384 Malignant neoplasm of pleura: Secondary | ICD-10-CM | POA: Diagnosis not present

## 2017-08-02 LAB — CBC WITH DIFFERENTIAL/PLATELET
Basophils Absolute: 0 10*3/uL (ref 0.0–0.1)
Basophils Relative: 0 %
EOS ABS: 0 10*3/uL (ref 0.0–0.7)
EOS PCT: 0 %
HCT: 41.1 % (ref 39.0–52.0)
Hemoglobin: 14.3 g/dL (ref 13.0–17.0)
LYMPHS ABS: 0.9 10*3/uL (ref 0.7–4.0)
Lymphocytes Relative: 8 %
MCH: 32.7 pg (ref 26.0–34.0)
MCHC: 34.8 g/dL (ref 30.0–36.0)
MCV: 94.1 fL (ref 78.0–100.0)
MONO ABS: 1.1 10*3/uL — AB (ref 0.1–1.0)
MONOS PCT: 11 %
Neutro Abs: 8.6 10*3/uL — ABNORMAL HIGH (ref 1.7–7.7)
Neutrophils Relative %: 81 %
PLATELETS: 266 10*3/uL (ref 150–400)
RBC: 4.37 MIL/uL (ref 4.22–5.81)
RDW: 14.2 % (ref 11.5–15.5)
WBC: 10.6 10*3/uL — ABNORMAL HIGH (ref 4.0–10.5)

## 2017-08-02 LAB — URINALYSIS, ROUTINE W REFLEX MICROSCOPIC
BACTERIA UA: NONE SEEN
Bilirubin Urine: NEGATIVE
GLUCOSE, UA: NEGATIVE mg/dL
KETONES UR: 5 mg/dL — AB
NITRITE: NEGATIVE
PH: 5 (ref 5.0–8.0)
PROTEIN: 100 mg/dL — AB
Specific Gravity, Urine: 1.036 — ABNORMAL HIGH (ref 1.005–1.030)

## 2017-08-02 LAB — COMPREHENSIVE METABOLIC PANEL
ALK PHOS: 63 U/L (ref 38–126)
ALT: 29 U/L (ref 17–63)
ANION GAP: 19 — AB (ref 5–15)
AST: 75 U/L — ABNORMAL HIGH (ref 15–41)
Albumin: 3 g/dL — ABNORMAL LOW (ref 3.5–5.0)
BUN: 46 mg/dL — AB (ref 6–20)
CHLORIDE: 96 mmol/L — AB (ref 101–111)
CO2: 26 mmol/L (ref 22–32)
Calcium: 9.9 mg/dL (ref 8.9–10.3)
Creatinine, Ser: 1.48 mg/dL — ABNORMAL HIGH (ref 0.61–1.24)
GFR calc non Af Amer: 40 mL/min — ABNORMAL LOW (ref >60)
GFR, EST AFRICAN AMERICAN: 46 mL/min — AB (ref >60)
GLUCOSE: 93 mg/dL (ref 65–99)
Potassium: 3.8 mmol/L (ref 3.5–5.1)
SODIUM: 141 mmol/L (ref 135–145)
TOTAL PROTEIN: 5.9 g/dL — AB (ref 6.5–8.1)
Total Bilirubin: 1 mg/dL (ref 0.3–1.2)

## 2017-08-02 LAB — I-STAT CG4 LACTIC ACID, ED
Lactic Acid, Venous: 5.65 mmol/L (ref 0.5–1.9)
Lactic Acid, Venous: 3.9 mmol/L (ref 0.5–1.9)

## 2017-08-02 LAB — LIPASE, BLOOD: Lipase: 24 U/L (ref 11–51)

## 2017-08-02 LAB — LACTIC ACID, PLASMA: Lactic Acid, Venous: 3.5 mmol/L (ref 0.5–1.9)

## 2017-08-02 MED ORDER — ATORVASTATIN CALCIUM 20 MG PO TABS
20.0000 mg | ORAL_TABLET | Freq: Every day | ORAL | Status: DC
  Administered 2017-08-03 – 2017-08-04 (×2): 20 mg via ORAL
  Filled 2017-08-02 (×2): qty 1

## 2017-08-02 MED ORDER — ONDANSETRON HCL 4 MG PO TABS: 4.0000 mg | ORAL_TABLET | Freq: Four times a day (QID) | ORAL | Status: DC | PRN

## 2017-08-02 MED ORDER — SODIUM CHLORIDE 0.9 % IV BOLUS (SEPSIS)
1000.0000 mL | Freq: Once | INTRAVENOUS | Status: AC
  Administered 2017-08-02: 1000 mL via INTRAVENOUS

## 2017-08-02 MED ORDER — ASPIRIN EC 81 MG PO TBEC
162.5000 mg | DELAYED_RELEASE_TABLET | Freq: Every evening | ORAL | Status: DC
  Administered 2017-08-03: 162 mg via ORAL
  Administered 2017-08-04: 162.5 mg via ORAL
  Filled 2017-08-02 (×2): qty 3

## 2017-08-02 MED ORDER — ACETAMINOPHEN 650 MG RE SUPP: 650.0000 mg | Freq: Four times a day (QID) | RECTAL | Status: DC | PRN

## 2017-08-02 MED ORDER — DEXTROSE 5 % IV SOLN
2.0000 g | INTRAVENOUS | Status: DC
  Administered 2017-08-03 – 2017-08-04 (×2): 2 g via INTRAVENOUS
  Filled 2017-08-02 (×3): qty 2

## 2017-08-02 MED ORDER — TAMSULOSIN HCL 0.4 MG PO CAPS
0.4000 mg | ORAL_CAPSULE | Freq: Every day | ORAL | Status: DC
Start: 2017-08-03 — End: 2017-08-05
  Administered 2017-08-03 – 2017-08-04 (×2): 0.4 mg via ORAL
  Filled 2017-08-02 (×2): qty 1

## 2017-08-02 MED ORDER — IOPAMIDOL (ISOVUE-370) INJECTION 76%
INTRAVENOUS | Status: AC
  Filled 2017-08-02: qty 100

## 2017-08-02 MED ORDER — IOPAMIDOL (ISOVUE-370) INJECTION 76%
80.0000 mL | Freq: Once | INTRAVENOUS | Status: AC | PRN
  Administered 2017-08-02: 80 mL via INTRAVENOUS

## 2017-08-02 MED ORDER — DEXTROSE 5 % IV SOLN
2.0000 g | Freq: Once | INTRAVENOUS | Status: AC
  Administered 2017-08-02: 2 g via INTRAVENOUS
  Filled 2017-08-02: qty 2

## 2017-08-02 MED ORDER — ENOXAPARIN SODIUM 40 MG/0.4ML ~~LOC~~ SOLN: 40.0000 mg | SUBCUTANEOUS | Status: DC

## 2017-08-02 MED ORDER — ACETAMINOPHEN 325 MG PO TABS
650.0000 mg | ORAL_TABLET | Freq: Four times a day (QID) | ORAL | Status: DC | PRN
  Administered 2017-08-03 – 2017-08-05 (×3): 650 mg via ORAL
  Filled 2017-08-02 (×2): qty 2

## 2017-08-02 MED ORDER — SODIUM CHLORIDE 0.9 % IV BOLUS (SEPSIS)
250.0000 mL | Freq: Once | INTRAVENOUS | Status: AC
  Administered 2017-08-02: 250 mL via INTRAVENOUS

## 2017-08-02 MED ORDER — ENSURE ENLIVE PO LIQD: 237.0000 mL | Freq: Two times a day (BID) | ORAL | Status: DC | PRN

## 2017-08-02 MED ORDER — SODIUM CHLORIDE 0.9 % IV BOLUS (SEPSIS): 2000.0000 mL | Freq: Once | INTRAVENOUS | Status: DC

## 2017-08-02 MED ORDER — ONDANSETRON HCL 4 MG/2ML IJ SOLN: 4.0000 mg | Freq: Four times a day (QID) | INTRAMUSCULAR | Status: DC | PRN

## 2017-08-02 MED ORDER — VERAPAMIL HCL ER 180 MG PO TBCR
180.0000 mg | EXTENDED_RELEASE_TABLET | Freq: Every day | ORAL | Status: DC
  Administered 2017-08-03 – 2017-08-04 (×2): 180 mg via ORAL
  Filled 2017-08-02 (×2): qty 1

## 2017-08-02 NOTE — ED Triage Notes (Signed)
Pt is presented by EMS for re-valuation of shortness of breath related to "abdominal/torso fluid retention" Seen 2 days ago for similar complaints.

## 2017-08-02 NOTE — H&P (Signed)
History and Physical  Patient Name: Derrick Rivera     YJE:[id number]    DOB: 1924/07/15    DOA: 08/02/2017 PCP: [name], MD  Patient coming from: Home  Chief Complaint: Weakness, lack of appetite      HPI: Derrick Rivera is a 83 y.o. male with a past medical history significant for CAD s/p PCI 2003, HTN, HFpEF, hx TIA, but also remote NHL in remission since 2000 and more recently bladder cancer now with bilateral ureteral stents who presents with progressive weight loss and weakness for several weeks.  The patient was diagnosed with bladder cancer in July, has been evaluated by Oncology and RadOnc already, and is to start what is being called definitive chemotherapy/XRT this week (he has retroperitoneal adenopathy on CT, but this has not been biopsied and is hoped? to be scarring or otherwise leftover from his old lymphoma).    However, in the run up to starting treatment, he has developed weakness, air hunger/dyspnea, and lack of appetite.  He was seen two weeks ago, admitted to our service 8/25-8/29, treated for CHF and PNA (with IV Lasix and Zithromax).  Since discharge, he has had no change.  He has "tightness" in his stomach.  Dyspnea, worse with sitting up, relieved with lying down.  Decreased appetite, globalized weakness (normally he is independent, this month he is needing a walker).  Now in the last 6 days "I haven't eaten anything", saw Cardiology who ordered CT scans, and today he was so weak he could barely get up so family brought him to the ER instead.  There has been no fever, cough, sputum production.  He had hematuria today, but no dysuria, malaise, nausea, chills, flank pain.    ED course: -Afebrile, heart rate 103, respirations 22, pulse ox normal on room air, BP 103/87 (normally hypertensive previously on 3 meds) -Na 141, K 3.8, Cr 1.5 (baseline 1.0), WBC 10.6K, Hgb 14.3 -Markedly elevated BUN-creatinine ratio -UA showed RBCs and WBCs -Lipase normal -Lactic acid  5.65 -CTA of the chest abdomen and pelvis showed progression (doubling in size) of his retroperitoneal adenopathy, his large bladder mass.  There was also a left pleural effusion.  No PE, no pneumonia, no ascites. -He was given 250 cc fluids and supplemental O2 and felt better and TRH were asked to evaluate       ROS: Review of Systems  Constitutional: Positive for malaise/fatigue and weight loss. Negative for chills and fever.  Respiratory: Positive for shortness of breath. Negative for cough, hemoptysis, sputum production and wheezing.   Cardiovascular: Positive for leg swelling. Negative for chest pain, orthopnea and PND.  Gastrointestinal: Positive for abdominal pain. Negative for nausea and vomiting.  Genitourinary: Positive for hematuria. Negative for dysuria, flank pain, frequency and urgency.  Neurological: Positive for weakness. Negative for focal weakness and loss of consciousness.  All other systems reviewed and are negative.         Past Medical History:  Diagnosis Date  . Allergic rhinitis   . Bilateral carotid artery stenosis    per last duplex [phone]  RICA 02-77%,  LICA 4-12%  . Bladder cancer [facility]) urologist-  dr Alyson Ingles   Non-invasise High Grade  . Chronic cough    ? lisinopril intolerence  . Coronary artery disease cardiologiat-  dr Shelva Majestic   hx PCI and stenting 2003/  per cardiac cath total occlusion RCA w/ collaterals 1995  . Diverticulosis of colon   . History of adenomatous polyp of colon  tubular adenoma's 1992; 1993; 1997; 2007  . History of colonic polyps   . History of nonmelanoma skin cancer    BCC and SCC right side nose  s/p moh's reseciton and reconstruction 2013/  left ear  01/ 2017/   06/ 2017 in office excision 2 areas on scalp  . History of TIAs takes ASA   pt states has a few what he thinks ia tia's, states last time with symptoms of veritgo and thought process was 04/ 2017  . HTN (hypertension)   . Hypercholesterolemia   .  Non-Hodgkin lymphoma, unspecified, unspecified site [facility]) oncologist-  dr Benay Spice--  pt in clinical remission since 2000   dx 1996  no specific therapy since 2000  . OA (osteoarthritis)   . OSA (obstructive sleep apnea)    moderate per study 08-10-2013--  intolerant cpap  . Prostate nodule   . Thrombocytopenia (HCC)    chronic mild  . Wears glasses     Past Surgical History:  Procedure Laterality Date  . CARDIOVASCULAR STRESS TEST  07-25-2016 dr Shelva Majestic   Intermediate risk  nuclear study w/ a medium defect of moderate severity present in the basal inferolateral, mid inferlateral , and apical inferior location-- Findings consistent w/ ischemia and prior MI w/ per-infarct ischemia-- Per Dr [name] mild inferior ischemia; similar defect in 2014 study but less scar on present study:  Nuclear stress EF 52%  . CATARACT EXTRACTION W/ INTRAOCULAR LENS  IMPLANT, BILATERAL  2013  . CORONARY ANGIOPLASTY  1995   total occlusion RCA w/ collaterals;  PTCA 2nd marginal LCFX  . CORONARY ANGIOPLASTY WITH STENT PLACEMENT  01-05-2002  dr Shelva Majestic   Complex high-speed rotational atherectomy of entire LAD multiple sites and stenting proximal and mid LAD/  no re-stenosis in prior CFX marginal dilatation site and RCA occluded w/ left to right collaterals,  ef 57%  . CYSTOSCOPY W/ RETROGRADES Bilateral 12/22/2016   Procedure: CYSTOSCOPY WITH RETROGRADE PYELOGRAM;  Surgeon: Cleon Gustin, MD;  Location: [facility];  Service: Urology;  Laterality: Bilateral;  . CYSTOSCOPY W/ URETERAL STENT PLACEMENT Bilateral 06/29/2017   Procedure: CYSTOSCOPY WITH RETROGRADE PYELOGRAM/URETERAL BILATERAL STENT PLACEMENT;  Surgeon: Cleon Gustin, MD;  Location: [facility];  Service: Urology;  Laterality: Bilateral;  . CYSTOSCOPY WITH BIOPSY N/A 06/29/2017   Procedure: CYSTOSCOPY WITH BIOPSY;  Surgeon: Cleon Gustin, MD;  Location: [facility];  Service: Urology;  Laterality: N/A;  .  CYSTOSCOPY/RETROGRADE/URETEROSCOPY/STONE EXTRACTION WITH BASKET Bilateral 05/05/2016   Procedure: CYSTOSCOPY WITH RETROGRADE PYELOGRAM;  Surgeon: Cleon Gustin, MD;  Location: [facility];  Service: Urology;  Laterality: Bilateral;  . MOHS SURGERY  2013   at [facility]   nose -- w/ Reconstructive surgery same year  . MOHS SURGERY  01/ 2017   left ear  . REFRACTIVE SURGERY Left 05/ 2017  . TONSILLECTOMY  age 57  . TOTAL KNEE ARTHROPLASTY Left 03-07-2002  . TOTAL SHOULDER ARTHROPLASTY Left 04-30-2005  . TRANSTHORACIC ECHOCARDIOGRAM  04-29-2012   borderline inferior wall hypokinesis, ef 65%/  mild dilated RV/  mild to moderate AV calcification without stenosis/  mild AR, TR, and  MR/  moderate PR/  mild aortic root dilatation  . TRANSURETHRAL RESECTION OF BLADDER TUMOR N/A 12/22/2016   Procedure: TRANSURETHRAL RESECTION OF BLADDER TUMOR (TURBT);  Surgeon: Cleon Gustin, MD;  Location: [facility];  Service: Urology;  Laterality: N/A;  . TRANSURETHRAL RESECTION OF BLADDER TUMOR WITH GYRUS (TURBT-GYRUS) N/A 05/05/2016   Procedure: TRANSURETHRAL  RESECTION OF BLADDER TUMOR WITH GYRUS (TURBT-GYRUS);  Surgeon: Cleon Gustin, MD;  Location: [facility];  Service: Urology;  Laterality: N/A;  . TRANSURETHRAL RESECTION OF BLADDER TUMOR WITH GYRUS (TURBT-GYRUS) N/A 06/09/2016   Procedure: TRANSURETHRAL RESECTION OF BLADDER TUMOR WITH GYRUS (TURBT-GYRUS);  Surgeon: Cleon Gustin, MD;  Location: [facility];  Service: Urology;  Laterality: N/A;    Social History: Patient lives with his wife.  The patient walks unassisted at baseline, lately with walker because of decompensation.  Nonsmoker.  From [place].  Retired from Chiropractor.  Allergies  Allergen Reactions  . Penicillins Other (See Comments)    Did not help with strep throat--Unknown reaction  Has patient had a PCN reaction causing immediate rash, facial/tongue/throat swelling, SOB or lightheadedness with  hypotension: No Has patient had a PCN reaction causing severe rash involving mucus membranes or skin necrosis: No Has patient had a PCN reaction that required hospitalization: No Has patient had a PCN reaction occurring within the last 10 years: No If all of the above answers are "NO", then may proceed with Cephalosporin use.      Family history: family history includes Heart disease in his father; Pancreatic cancer in his mother.  Prior to Admission medications   Medication Sig Start Date End Date Taking? Authorizing Provider  acetaminophen (TYLENOL) 500 MG tablet Take 250 mg by mouth 2 (two) times daily.   Yes [provider]  aspirin EC 325 MG tablet Take 162.5 mg by mouth every evening.    Yes [provider]  atorvastatin (LIPITOR) 20 MG tablet TAKE 1 TABLET (20 MG TOTAL) BY MOUTH DAILY. Patient taking differently: TAKE 1 TABLET (20 MG TOTAL) BY MOUTH DAILY AT NIGHT 04/02/17  Yes Troy Sine, MD  furosemide (LASIX) 40 MG tablet Take 1 tablet (40 mg total) by mouth daily. 07/30/17  Yes Lavina Hamman, MD  prochlorperazine (COMPAZINE) 10 MG tablet Take 1 tablet (10 mg total) by mouth every 6 (six) hours as needed for nausea or vomiting. 07/15/17  Yes Wyatt Portela, MD  tamsulosin (FLOMAX) 0.4 MG CAPS capsule Take 1 capsule (0.4 mg total) by mouth daily after supper. 07/20/17  Yes Hayden Pedro, PA-C  traMADol (ULTRAM) 50 MG tablet Take 1 tablet (50 mg total) by mouth every 4 (four) hours as needed. 06/29/17 06/29/18 Yes McKenzie, Candee Furbish, MD  verapamil (CALAN-SR) 180 MG CR tablet Take 1 tablet (180 mg total) by mouth at bedtime. 02/16/17  Yes Troy Sine, MD  atenolol (TENORMIN) 50 MG tablet Take 50 mg by mouth daily. 07/11/17   [provider]  Azelastine-Fluticasone 137-50 MCG/ACT SUSP Place 1 spray into the nose daily as needed. 01/23/17   [name], MD  feeding supplement, ENSURE ENLIVE, (ENSURE ENLIVE) LIQD Take 237 mLs by mouth 2 (two)  times daily between meals. Patient not taking: Reported on 08/02/2017 07/29/17   Lavina Hamman, MD  hydrochlorothiazide (MICROZIDE) 12.5 MG capsule Take 12.5 mg by mouth daily. 07/01/17   [provider]  isosorbide mononitrate (IMDUR) 60 MG 24 hr tablet Take 90 mg by mouth daily. 07/23/17   [provider]       Physical Exam: BP 119/72   Pulse 86   Temp (!) 97.5 F (36.4 C) (Oral)   Resp 18   Ht 5\' 10"  (1.778 m)   Wt 73.5 kg (162 lb)   SpO2 98%   BMI 23.24 kg/m  General appearance: Cachectic elderly adult  male, alert and in mild distress from dyspnea.   Eyes: Anicteric, conjunctiva pink, lids and lashes normal. PERRL.    ENT: No nasal deformity, discharge, epistaxis.  Hearing normal. OP tacky dry without lesions.   Neck: No neck masses.  Trachea midline.  No thyromegaly/tenderness. Lymph: No cervical or supraclavicular lymphadenopathy.  No groin lymphadenopathy. Skin: Warm and dry.  No jaundice.  No suspicious rashes or lesions. Cardiac: RRR, nl S1-S2, no murmurs appreciated.  Capillary refill is brisk.  JVP not visible.  Trace LE edema (has hose on).  Radial pulses 2+ and symmetric. Respiratory: Normal respiratory rate and rhythm.  CTAB without rales or wheezes. Abdomen: Abdomen soft.  No TTP.  There is a firm mass in the suprapubic area. No ascites, distension, hepatosplenomegaly.   MSK: No deformities or effusions.  No cyanosis or clubbing.  Diffuse loss of muscle mass and subQ fat. Neuro: Cranial nerves grossly symmetric.  Sensation intact to light touch. Speech is fluent.  Muscle strength globally very weak, but symmetric.    Psych: Sensorium intact and responding to questions, attention normal.  Behavior appropriate.  Affect blunted by fatigue.  Judgment and insight appear normal.     Labs on Admission:  I have personally reviewed following labs and imaging studies: CBC:  Recent Labs Lab 07/27/17 0409 07/29/17 0347 08/02/17 1610  WBC 8.1 8.9 10.6*    NEUTROABS  --   --  8.6*  HGB 12.5* 13.3 14.3  HCT 36.4* 39.0 41.1  MCV 92.9 94.4 94.1  PLT 231 209 573   Basic Metabolic Panel:  Recent Labs Lab 07/27/17 0409 07/28/17 0346 07/29/17 0347 08/02/17 1610  NA 138 138 140 141  K 3.5 3.8 3.8 3.8  CL 99* 99* 100* 96*  CO2 27 28 27 26   GLUCOSE 81 86 83 93  BUN 27* 28* 26* 46*  CREATININE 1.08 1.08 1.04 1.48*  CALCIUM 9.0 9.1 9.2 9.9   GFR: Estimated Creatinine Clearance: 34.3 mL/min (A) (by C-G formula based on SCr of 1.48 mg/dL (H)).  Liver Function Tests:  Recent Labs Lab 08/02/17 1610  AST 75*  ALT 29  ALKPHOS 63  BILITOT 1.0  PROT 5.9*  ALBUMIN 3.0*    Recent Labs Lab 08/02/17 1610  LIPASE 24   No results for input(s): AMMONIA in the last 168 hours. Coagulation Profile: No results for input(s): INR, PROTIME in the last 168 hours. Cardiac Enzymes: No results for input(s): CKTOTAL, CKMB, CKMBINDEX, TROPONINI in the last 168 hours. BNP (last 3 results) No results for input(s): PROBNP in the last 8760 hours. HbA1C: No results for input(s): HGBA1C in the last 72 hours. CBG: No results for input(s): GLUCAP in the last 168 hours. Lipid Profile: No results for input(s): CHOL, HDL, LDLCALC, TRIG, CHOLHDL, LDLDIRECT in the last 72 hours. Thyroid Function Tests: No results for input(s): TSH, T4TOTAL, FREET4, T3FREE, THYROIDAB in the last 72 hours. Anemia Panel: No results for input(s): VITAMINB12, FOLATE, FERRITIN, TIBC, IRON, RETICCTPCT in the last 72 hours. Sepsis Labs: Lactic acid 5.65 --> 3.9 Invalid input(s): PROCALCITONIN, LACTICIDVEN No results found for this or any previous visit (from the past 240 hour(s)).       Radiological Exams on Admission: Personally reviewed CTA chest abdomen and pelvis reports, discussed with Radiology: Ct Angio Chest Pe W/cm &/or Wo Cm  Result Date: 08/02/2017 CLINICAL DATA:  83 year old male with acute shortness of breath, abdominal and pelvic distention and unexpected  weight loss. History of bladder cancer undergoing BCG treatment. History  of non-Hodgkin's lymphoma. EXAM: CT ANGIOGRAPHY CHEST CT ABDOMEN AND PELVIS WITH CONTRAST TECHNIQUE: Multidetector CT imaging of the chest was performed using the standard protocol during bolus administration of intravenous contrast. Multiplanar CT image reconstructions and MIPs were obtained to evaluate the vascular anatomy. Multidetector CT imaging of the abdomen and pelvis was performed using the standard protocol during bolus administration of intravenous contrast. CONTRAST:  80 cc intravenous Isovue 370 COMPARISON:  06/25/2017 abdominal and pelvic CT, 12/21/2008 chest CT and other studies. FINDINGS: CTA CHEST FINDINGS Cardiovascular: This is a technically satisfactory study. No pulmonary emboli are identified. Cardiomegaly and heavy coronary artery calcifications again noted. Diffuse aneurysm of the ascending aorta again noted measuring 4 cm in greatest diameter. Aortic atherosclerotic calcifications are again noted. A small pericardial effusion is now identified. Mediastinum/Nodes: New mildly enlarged enlarged mediastinal and bilateral hilar lymph nodes are identified as follows (series 5): A 1.2 cm AP window node (image 28) A 1.6 x 2.5 cm subcarinal node (image 37) a 1.5 x 2.5 cm lower left hilar node (image 42) Other new much smaller lymph nodes within the mediastinum are present. Lungs/Pleura: A moderate to large left pleural effusion and small right pleural effusion are identified. Bilateral lower lobe atelectasis identified.No definite pulmonary mass is noted. There is no evidence of pneumothorax. Musculoskeletal: No acute or suspicious abnormality. Left shoulder arthroplasty and degenerative changes in the right shoulder are noted. Review of the MIP images confirms the above findings. CT ABDOMEN and PELVIS FINDINGS Hepatobiliary: No acute or focal hepatic abnormality. Cholelithiasis identified. No biliary dilatation. Pancreas:  Pancreatic atrophy without other significant abnormality Spleen: Unremarkable Adrenals/Urinary Tract: Bilateral ureteral stents are now identified with tips in the renal pelvis and bladder. Severe right and moderate left hydronephrosis again noted and not significantly changed. Bilateral renal cortical atrophy again identified. The adrenal glands are unremarkable. Diffuse bladder wall thickening is again noted. Stomach/Bowel: Irregular wall thickening of the proximal sigmoid colon appears increased since the prior study. There is no evidence of bowel obstruction, pneumoperitoneum or focal abscess. Vascular/Lymphatic: Aortic atherosclerosis. New and enlarging adenopathy within the retroperitoneum, abdomen and mesenteric noted as follows (series 6): A 2.1 cm left retroperitoneal node (image 33), previously 1.1 cm. A 2 cm left retroperitoneal node (image 41), previously 1.4 cm. Reproductive: Prostate unchanged Other: Increasing diffuse ill-defined soft tissue within the pelvic fat, omentum and mesenteric noted. A small amount of ascites adjacent to the liver now noted. Heavy calcification within the mesenteric again identified. Musculoskeletal: No acute abnormalities. Review of the MIP images confirms the above findings. IMPRESSION: 1. Increasing and new adenopathy within the chest, abdomen and pelvis and new ill-defined soft tissue opacities within the abdomen, omentum, mesenteric and pelvis - highly suspicious for increasing malignancy/metastatic disease and/or lymphoma. Diffuse bladder wall thickening again noted. 2. Moderate left pleural effusion, small right pleural effusion, small pericardial effusion and small amount of ascites. 3. Increasing diffuse wall thickening of the proximal sigmoid colon -favor metastatic disease over primary malignancy. 4. No evidence of pulmonary emboli. 5. Unchanged 4 cm ascending thoracic aortic aneurysm 6. Bilateral urinary stents with unchanged severe right and moderate left  hydronephrosis. 7. Cardiomegaly and coronary artery disease. Electronically Signed   By: Margarette Canada M.D.   On: 08/02/2017 18:17   Ct Abdomen Pelvis W Contrast  Result Date: 08/02/2017 CLINICAL DATA:  83 year old male with acute shortness of breath, abdominal and pelvic distention and unexpected weight loss. History of bladder cancer undergoing BCG treatment. History of non-Hodgkin's lymphoma. EXAM: CT ANGIOGRAPHY  CHEST CT ABDOMEN AND PELVIS WITH CONTRAST TECHNIQUE: Multidetector CT imaging of the chest was performed using the standard protocol during bolus administration of intravenous contrast. Multiplanar CT image reconstructions and MIPs were obtained to evaluate the vascular anatomy. Multidetector CT imaging of the abdomen and pelvis was performed using the standard protocol during bolus administration of intravenous contrast. CONTRAST:  80 cc intravenous Isovue 370 COMPARISON:  06/25/2017 abdominal and pelvic CT, 12/21/2008 chest CT and other studies. FINDINGS: CTA CHEST FINDINGS Cardiovascular: This is a technically satisfactory study. No pulmonary emboli are identified. Cardiomegaly and heavy coronary artery calcifications again noted. Diffuse aneurysm of the ascending aorta again noted measuring 4 cm in greatest diameter. Aortic atherosclerotic calcifications are again noted. A small pericardial effusion is now identified. Mediastinum/Nodes: New mildly enlarged enlarged mediastinal and bilateral hilar lymph nodes are identified as follows (series 5): A 1.2 cm AP window node (image 28) A 1.6 x 2.5 cm subcarinal node (image 37) a 1.5 x 2.5 cm lower left hilar node (image 42) Other new much smaller lymph nodes within the mediastinum are present. Lungs/Pleura: A moderate to large left pleural effusion and small right pleural effusion are identified. Bilateral lower lobe atelectasis identified.No definite pulmonary mass is noted. There is no evidence of pneumothorax. Musculoskeletal: No acute or suspicious  abnormality. Left shoulder arthroplasty and degenerative changes in the right shoulder are noted. Review of the MIP images confirms the above findings. CT ABDOMEN and PELVIS FINDINGS Hepatobiliary: No acute or focal hepatic abnormality. Cholelithiasis identified. No biliary dilatation. Pancreas: Pancreatic atrophy without other significant abnormality Spleen: Unremarkable Adrenals/Urinary Tract: Bilateral ureteral stents are now identified with tips in the renal pelvis and bladder. Severe right and moderate left hydronephrosis again noted and not significantly changed. Bilateral renal cortical atrophy again identified. The adrenal glands are unremarkable. Diffuse bladder wall thickening is again noted. Stomach/Bowel: Irregular wall thickening of the proximal sigmoid colon appears increased since the prior study. There is no evidence of bowel obstruction, pneumoperitoneum or focal abscess. Vascular/Lymphatic: Aortic atherosclerosis. New and enlarging adenopathy within the retroperitoneum, abdomen and mesenteric noted as follows (series 6): A 2.1 cm left retroperitoneal node (image 33), previously 1.1 cm. A 2 cm left retroperitoneal node (image 41), previously 1.4 cm. Reproductive: Prostate unchanged Other: Increasing diffuse ill-defined soft tissue within the pelvic fat, omentum and mesenteric noted. A small amount of ascites adjacent to the liver now noted. Heavy calcification within the mesenteric again identified. Musculoskeletal: No acute abnormalities. Review of the MIP images confirms the above findings. IMPRESSION: 1. Increasing and new adenopathy within the chest, abdomen and pelvis and new ill-defined soft tissue opacities within the abdomen, omentum, mesenteric and pelvis - highly suspicious for increasing malignancy/metastatic disease and/or lymphoma. Diffuse bladder wall thickening again noted. 2. Moderate left pleural effusion, small right pleural effusion, small pericardial effusion and small amount of  ascites. 3. Increasing diffuse wall thickening of the proximal sigmoid colon -favor metastatic disease over primary malignancy. 4. No evidence of pulmonary emboli. 5. Unchanged 4 cm ascending thoracic aortic aneurysm 6. Bilateral urinary stents with unchanged severe right and moderate left hydronephrosis. 7. Cardiomegaly and coronary artery disease. Electronically Signed   By: Margarette Canada M.D.   On: 08/02/2017 18:17    EKG: Independently reviewed. Rate 94, QTc 442, no ST changes.  Echocardiogram 2018: Report reviewed EF 60-65% Grade I DD       Assessment/Plan  1. Dyspnea:  Possibly from his effusion.  Feels better with Oxygen.  No pneumonia or PE on  CT.  Clinically does not seem to be CHF, I agree with Cardiology. Overall, I suspect this may also be from failure to thrive from progression of his cancer.   -US thoracentesis ordered -LDH, cell count, culture -Supplemental O2   2. Elevated lactic acid:  The patient is noted to have a lactate>4. With the current information available to me, I don't think the patient is in septic shock although he may have infection. The lactate>4, is related to severe dehydation in setting of failure to thrive and deconditioning, advanced cancer. -Cefepime IV ordered -Follow blood and urine cultures -IV fluids and trend lactic acid -Check procalcitonin -US thoracentesis as above  3. Bladder cancer:  Followed by Dr. Alyson Ingles and Dr. Alen Blew. Has ureteral stents in place. -Continue tramadol for pain -Continue tamsulosin -Consult Oncology  4. CAD/CHF/HTN/TIA:  -BP meds were stopped recently -Hold verapamil tonight, restart tomorrow -Continue aspirin, statin -Hold Lasix for now  5. Elevated creatinine:  Suspet this is a mild AKI from dehydartion given elevated BUN/Creat -IV fluids and trend         DVT prophylaxis: SCDs  Code Status: FULL  Family Communication: Son at bedisde, overnight plan discussed, CODE STATUS confirmed    Disposition Plan: Anticipate US thoracentesis, empiric antibiotics, consult to Oncology, and possibly palliative care Consults called: Dr. Ilene Qua alerted by inbasket Admission status: INAPTIENT    Medical decision making: Patient seen at 10:40 PM on 08/02/2017.  The patient was discussed with Dr. Ashok Cordia.  What exists of the patient's chart was reviewed in depth and summarized above.  Clinical condition: stable.        Edwin Dada [facility] Pager [phone]

## 2017-08-02 NOTE — ED Notes (Signed)
Bed: [facility] Expected date:  Expected time:  Means of arrival:  Comments: 

## 2017-08-02 NOTE — Progress Notes (Signed)
Pharmacy Antibiotic Note  Derrick Rivera is a 83 y.o. male with SOB related to abdominal/torso fluid retention admitted on 08/02/2017 with UTI.  Pharmacy has been consulted for cefepime dosing.  Plan: Cefepime 2 Gm IV q24h F/u scr/cultures  Height: 5\' 10"  (177.8 cm) Weight: 162 lb (73.5 kg) IBW/kg (Calculated) : 73  Temp (24hrs), Avg:97.5 F (36.4 C), Min:97.5 F (36.4 C), Max:97.5 F (36.4 C)   Recent Labs Lab 07/27/17 0409 07/28/17 0346 07/29/17 0347 08/02/17 1610 08/02/17 1622 08/02/17 1925  WBC 8.1  --  8.9 10.6*  --   --   CREATININE 1.08 1.08 1.04 1.48*  --   --   LATICACIDVEN  --   --   --   --  5.65* 3.90*    Estimated Creatinine Clearance: 34.3 mL/min (A) (by C-G formula based on SCr of 1.48 mg/dL (H)).    Allergies  Allergen Reactions  . Penicillins Other (See Comments)    Did not help with strep throat--Unknown reaction  Has patient had a PCN reaction causing immediate rash, facial/tongue/throat swelling, SOB or lightheadedness with hypotension: No Has patient had a PCN reaction causing severe rash involving mucus membranes or skin necrosis: No Has patient had a PCN reaction that required hospitalization: No Has patient had a PCN reaction occurring within the last 10 years: No If all of the above answers are "NO", then may proceed with Cephalosporin use.      Antimicrobials this admission: 9/2 cefepime >>    >>   Dose adjustments this admission:   Microbiology results:  BCx:   UCx:    Sputum:    MRSA PCR:   Thank you for allowing pharmacy to be a part of this patient's care.  Dorrene German 08/02/2017 10:43 PM

## 2017-08-02 NOTE — ED Notes (Signed)
ED TO INPATIENT HANDOFF REPORT  Name/Age/Gender Derrick Rivera 83 y.o. male  Code Status Code Status History    Date Active Date Inactive Code Status Order ID Comments User Context   07/25/2017  2:50 PM 07/29/2017  6:15 PM Full Code [id number]  Mathews, Elizabeth G, MD ED   06/29/2017  1:24 PM 06/29/2017 10:43 PM Full Code [id number]  McKenzie, Patrick L, MD Inpatient      Home/SNF/Other Home  Chief Complaint Fluid retention  Level of Care/Admitting Diagnosis ED Disposition    ED Disposition Condition Comment   Admit  Hospital Area: [facility] [[id number]]  Level of Care: Telemetry [5]  Admit to tele based on following criteria: Other see comments  Comments: sepsis  Diagnosis: Dyspnea [241871]  Admitting Physician: DANFORD, CHRISTOPHER P [[id number]]  Attending Physician: DANFORD, CHRISTOPHER P [[id number]]  Estimated length of stay: past midnight tomorrow  Certification:: I certify this patient will need inpatient services for at least 2 midnights  PT Class (Do Not Modify): Inpatient [101]  PT Acc Code (Do Not Modify): Private [1]       Medical History Past Medical History:  Diagnosis Date  . Allergic rhinitis   . Bilateral carotid artery stenosis    per last duplex 09-18-2015  RICA 40-59%,  LICA 0-39%  . Bladder cancer (HCC) urologist-  dr mckenzie   Non-invasise High Grade  . Chronic cough    ? lisinopril intolerence  . Coronary artery disease cardiologiat-  dr thomas kelly   hx PCI and stenting 2003/  per cardiac cath total occlusion RCA w/ collaterals 1995  . Diverticulosis of colon   . History of adenomatous polyp of colon    tubular adenoma's 1992; 1993; 1997; 2007  . History of colonic polyps   . History of nonmelanoma skin cancer    BCC and SCC right side nose  s/p moh's reseciton and reconstruction 2013/  left ear  01/ 2017/   06/ 2017 in office excision 2 areas on scalp  . History of TIAs takes ASA   pt states has a few what he thinks ia tia's,  states last time with symptoms of veritgo and thought process was 04/ 2017  . HTN (hypertension)   . Hypercholesterolemia   . Non-Hodgkin lymphoma, unspecified, unspecified site (HCC) oncologist-  dr sherrill--  pt in clinical remission since 2000   dx 1996  no specific therapy since 2000  . OA (osteoarthritis)   . OSA (obstructive sleep apnea)    moderate per study 08-10-2013--  intolerant cpap  . Prostate nodule   . Thrombocytopenia (HCC)    chronic mild  . Wears glasses     Allergies Allergies  Allergen Reactions  . Penicillins Other (See Comments)    Did not help with strep throat--Unknown reaction  Has patient had a PCN reaction causing immediate rash, facial/tongue/throat swelling, SOB or lightheadedness with hypotension: No Has patient had a PCN reaction causing severe rash involving mucus membranes or skin necrosis: No Has patient had a PCN reaction that required hospitalization: No Has patient had a PCN reaction occurring within the last 10 years: No If all of the above answers are "NO", then may proceed with Cephalosporin use.      IV Location/Drains/Wounds Patient Lines/Drains/Airways Status   Active Line/Drains/Airways    Name:   Placement date:   Placement time:   Site:   Days:   Peripheral IV 08/02/17 Left Antecubital  08/02/17    1612    Antecubital      less than 1   Peripheral IV 08/02/17 Left Forearm  08/02/17    2300    Forearm    less than 1   Urethral Catheter Dr. Alyson Ingles Latex;Straight-tip;Triple-lumen 22 Fr.  06/29/17    1134    Latex;Straight-tip;Triple-lumen    34   Ureteral Drain/Stent Left ureter 6 Fr.  06/29/17    1117    Left ureter    34   Ureteral Drain/Stent Right ureter 6 Fr.  06/29/17    1124    Right ureter    34   External Urinary Catheter  07/26/17    1700        7          Labs/Imaging Results for orders placed or performed during the hospital encounter of 08/02/17 (from the past 48 hour(s))  Urinalysis, Routine w reflex microscopic      Status: Abnormal   Collection Time: 08/02/17  4:03 PM  Result Value Ref Range   Color, Urine YELLOW YELLOW   APPearance CLOUDY (A) CLEAR   Specific Gravity, Urine 1.036 (H) 1.005 - 1.030   pH 5.0 5.0 - 8.0   Glucose, UA NEGATIVE NEGATIVE mg/dL   Hgb urine dipstick LARGE (A) NEGATIVE   Bilirubin Urine NEGATIVE NEGATIVE   Ketones, ur 5 (A) NEGATIVE mg/dL   Protein, ur 100 (A) NEGATIVE mg/dL   Nitrite NEGATIVE NEGATIVE   Leukocytes, UA MODERATE (A) NEGATIVE   RBC / HPF TOO NUMEROUS TO COUNT 0 - 5 RBC/hpf   WBC, UA TOO NUMEROUS TO COUNT 0 - 5 WBC/hpf   Bacteria, UA NONE SEEN NONE SEEN   Squamous Epithelial / LPF 0-5 (A) NONE SEEN   WBC Clumps PRESENT    Mucus PRESENT    Hyaline Casts, UA PRESENT   CBC with Differential     Status: Abnormal   Collection Time: 08/02/17  4:10 PM  Result Value Ref Range   WBC 10.6 (H) 4.0 - 10.5 K/uL   RBC 4.37 4.22 - 5.81 MIL/uL   Hemoglobin 14.3 13.0 - 17.0 g/dL   HCT 41.1 39.0 - 52.0 %   MCV 94.1 78.0 - 100.0 fL   MCH 32.7 26.0 - 34.0 pg   MCHC 34.8 30.0 - 36.0 g/dL   RDW 14.2 11.5 - 15.5 %   Platelets 266 150 - 400 K/uL   Neutrophils Relative % 81 %   Neutro Abs 8.6 (H) 1.7 - 7.7 K/uL   Lymphocytes Relative 8 %   Lymphs Abs 0.9 0.7 - 4.0 K/uL   Monocytes Relative 11 %   Monocytes Absolute 1.1 (H) 0.1 - 1.0 K/uL   Eosinophils Relative 0 %   Eosinophils Absolute 0.0 0.0 - 0.7 K/uL   Basophils Relative 0 %   Basophils Absolute 0.0 0.0 - 0.1 K/uL  Comprehensive metabolic panel     Status: Abnormal   Collection Time: 08/02/17  4:10 PM  Result Value Ref Range   Sodium 141 135 - 145 mmol/L   Potassium 3.8 3.5 - 5.1 mmol/L   Chloride 96 (L) 101 - 111 mmol/L   CO2 26 22 - 32 mmol/L   Glucose, Bld 93 65 - 99 mg/dL   BUN 46 (H) 6 - 20 mg/dL   Creatinine, Ser 1.48 (H) 0.61 - 1.24 mg/dL   Calcium 9.9 8.9 - 10.3 mg/dL   Total Protein 5.9 (L) 6.5 - 8.1 g/dL   Albumin 3.0 (L) 3.5 - 5.0 g/dL   AST 75 (H) 15 - 41 U/L  ALT 29 17 - 63 U/L    Alkaline Phosphatase 63 38 - 126 U/L   Total Bilirubin 1.0 0.3 - 1.2 mg/dL   GFR calc non Af Amer 40 (L) >60 mL/min   GFR calc Af Amer 46 (L) >60 mL/min    Comment: (NOTE) The eGFR has been calculated using the CKD EPI equation. This calculation has not been validated in all clinical situations. eGFR's persistently <60 mL/min signify possible Chronic Kidney Disease.    Anion gap 19 (H) 5 - 15  Lipase, blood     Status: None   Collection Time: 08/02/17  4:10 PM  Result Value Ref Range   Lipase 24 11 - 51 U/L  I-Stat CG4 Lactic Acid, ED     Status: Abnormal   Collection Time: 08/02/17  4:22 PM  Result Value Ref Range   Lactic Acid, Venous 5.65 (HH) 0.5 - 1.9 mmol/L   Comment NOTIFIED PHYSICIAN   I-Stat CG4 Lactic Acid, ED     Status: Abnormal   Collection Time: 08/02/17  7:25 PM  Result Value Ref Range   Lactic Acid, Venous 3.90 (HH) 0.5 - 1.9 mmol/L   Comment NOTIFIED PHYSICIAN    Ct Angio Chest Pe W/cm &/or Wo Cm  Result Date: 08/02/2017 CLINICAL DATA:  83-year-old male with acute shortness of breath, abdominal and pelvic distention and unexpected weight loss. History of bladder cancer undergoing BCG treatment. History of non-Hodgkin's lymphoma. EXAM: CT ANGIOGRAPHY CHEST CT ABDOMEN AND PELVIS WITH CONTRAST TECHNIQUE: Multidetector CT imaging of the chest was performed using the standard protocol during bolus administration of intravenous contrast. Multiplanar CT image reconstructions and MIPs were obtained to evaluate the vascular anatomy. Multidetector CT imaging of the abdomen and pelvis was performed using the standard protocol during bolus administration of intravenous contrast. CONTRAST:  80 cc intravenous Isovue 370 COMPARISON:  06/25/2017 abdominal and pelvic CT, 12/21/2008 chest CT and other studies. FINDINGS: CTA CHEST FINDINGS Cardiovascular: This is a technically satisfactory study. No pulmonary emboli are identified. Cardiomegaly and heavy coronary artery calcifications again  noted. Diffuse aneurysm of the ascending aorta again noted measuring 4 cm in greatest diameter. Aortic atherosclerotic calcifications are again noted. A small pericardial effusion is now identified. Mediastinum/Nodes: New mildly enlarged enlarged mediastinal and bilateral hilar lymph nodes are identified as follows (series 5): A 1.2 cm AP window node (image 28) A 1.6 x 2.5 cm subcarinal node (image 37) a 1.5 x 2.5 cm lower left hilar node (image 42) Other new much smaller lymph nodes within the mediastinum are present. Lungs/Pleura: A moderate to large left pleural effusion and small right pleural effusion are identified. Bilateral lower lobe atelectasis identified.No definite pulmonary mass is noted. There is no evidence of pneumothorax. Musculoskeletal: No acute or suspicious abnormality. Left shoulder arthroplasty and degenerative changes in the right shoulder are noted. Review of the MIP images confirms the above findings. CT ABDOMEN and PELVIS FINDINGS Hepatobiliary: No acute or focal hepatic abnormality. Cholelithiasis identified. No biliary dilatation. Pancreas: Pancreatic atrophy without other significant abnormality Spleen: Unremarkable Adrenals/Urinary Tract: Bilateral ureteral stents are now identified with tips in the renal pelvis and bladder. Severe right and moderate left hydronephrosis again noted and not significantly changed. Bilateral renal cortical atrophy again identified. The adrenal glands are unremarkable. Diffuse bladder wall thickening is again noted. Stomach/Bowel: Irregular wall thickening of the proximal sigmoid colon appears increased since the prior study. There is no evidence of bowel obstruction, pneumoperitoneum or focal abscess. Vascular/Lymphatic: Aortic atherosclerosis. New and enlarging adenopathy   within the retroperitoneum, abdomen and mesenteric noted as follows (series 6): A 2.1 cm left retroperitoneal node (image 33), previously 1.1 cm. A 2 cm left retroperitoneal node (image  41), previously 1.4 cm. Reproductive: Prostate unchanged Other: Increasing diffuse ill-defined soft tissue within the pelvic fat, omentum and mesenteric noted. A small amount of ascites adjacent to the liver now noted. Heavy calcification within the mesenteric again identified. Musculoskeletal: No acute abnormalities. Review of the MIP images confirms the above findings. IMPRESSION: 1. Increasing and new adenopathy within the chest, abdomen and pelvis and new ill-defined soft tissue opacities within the abdomen, omentum, mesenteric and pelvis - highly suspicious for increasing malignancy/metastatic disease and/or lymphoma. Diffuse bladder wall thickening again noted. 2. Moderate left pleural effusion, small right pleural effusion, small pericardial effusion and small amount of ascites. 3. Increasing diffuse wall thickening of the proximal sigmoid colon -favor metastatic disease over primary malignancy. 4. No evidence of pulmonary emboli. 5. Unchanged 4 cm ascending thoracic aortic aneurysm 6. Bilateral urinary stents with unchanged severe right and moderate left hydronephrosis. 7. Cardiomegaly and coronary artery disease. Electronically Signed   By: Margarette Canada M.D.   On: 08/02/2017 18:17   Ct Abdomen Pelvis W Contrast  Result Date: 08/02/2017 CLINICAL DATA:  83 year old male with acute shortness of breath, abdominal and pelvic distention and unexpected weight loss. History of bladder cancer undergoing BCG treatment. History of non-Hodgkin's lymphoma. EXAM: CT ANGIOGRAPHY CHEST CT ABDOMEN AND PELVIS WITH CONTRAST TECHNIQUE: Multidetector CT imaging of the chest was performed using the standard protocol during bolus administration of intravenous contrast. Multiplanar CT image reconstructions and MIPs were obtained to evaluate the vascular anatomy. Multidetector CT imaging of the abdomen and pelvis was performed using the standard protocol during bolus administration of intravenous contrast. CONTRAST:  80 cc  intravenous Isovue 370 COMPARISON:  06/25/2017 abdominal and pelvic CT, 12/21/2008 chest CT and other studies. FINDINGS: CTA CHEST FINDINGS Cardiovascular: This is a technically satisfactory study. No pulmonary emboli are identified. Cardiomegaly and heavy coronary artery calcifications again noted. Diffuse aneurysm of the ascending aorta again noted measuring 4 cm in greatest diameter. Aortic atherosclerotic calcifications are again noted. A small pericardial effusion is now identified. Mediastinum/Nodes: New mildly enlarged enlarged mediastinal and bilateral hilar lymph nodes are identified as follows (series 5): A 1.2 cm AP window node (image 28) A 1.6 x 2.5 cm subcarinal node (image 37) a 1.5 x 2.5 cm lower left hilar node (image 42) Other new much smaller lymph nodes within the mediastinum are present. Lungs/Pleura: A moderate to large left pleural effusion and small right pleural effusion are identified. Bilateral lower lobe atelectasis identified.No definite pulmonary mass is noted. There is no evidence of pneumothorax. Musculoskeletal: No acute or suspicious abnormality. Left shoulder arthroplasty and degenerative changes in the right shoulder are noted. Review of the MIP images confirms the above findings. CT ABDOMEN and PELVIS FINDINGS Hepatobiliary: No acute or focal hepatic abnormality. Cholelithiasis identified. No biliary dilatation. Pancreas: Pancreatic atrophy without other significant abnormality Spleen: Unremarkable Adrenals/Urinary Tract: Bilateral ureteral stents are now identified with tips in the renal pelvis and bladder. Severe right and moderate left hydronephrosis again noted and not significantly changed. Bilateral renal cortical atrophy again identified. The adrenal glands are unremarkable. Diffuse bladder wall thickening is again noted. Stomach/Bowel: Irregular wall thickening of the proximal sigmoid colon appears increased since the prior study. There is no evidence of bowel obstruction,  pneumoperitoneum or focal abscess. Vascular/Lymphatic: Aortic atherosclerosis. New and enlarging adenopathy within the retroperitoneum, abdomen and  mesenteric noted as follows (series 6): A 2.1 cm left retroperitoneal node (image 33), previously 1.1 cm. A 2 cm left retroperitoneal node (image 41), previously 1.4 cm. Reproductive: Prostate unchanged Other: Increasing diffuse ill-defined soft tissue within the pelvic fat, omentum and mesenteric noted. A small amount of ascites adjacent to the liver now noted. Heavy calcification within the mesenteric again identified. Musculoskeletal: No acute abnormalities. Review of the MIP images confirms the above findings. IMPRESSION: 1. Increasing and new adenopathy within the chest, abdomen and pelvis and new ill-defined soft tissue opacities within the abdomen, omentum, mesenteric and pelvis - highly suspicious for increasing malignancy/metastatic disease and/or lymphoma. Diffuse bladder wall thickening again noted. 2. Moderate left pleural effusion, small right pleural effusion, small pericardial effusion and small amount of ascites. 3. Increasing diffuse wall thickening of the proximal sigmoid colon -favor metastatic disease over primary malignancy. 4. No evidence of pulmonary emboli. 5. Unchanged 4 cm ascending thoracic aortic aneurysm 6. Bilateral urinary stents with unchanged severe right and moderate left hydronephrosis. 7. Cardiomegaly and coronary artery disease. Electronically Signed   By: Margarette Canada M.D.   On: 08/02/2017 18:17    Pending Labs [employer]    Start     Ordered   08/02/17 2229  Culture, blood (x 2)  BLOOD CULTURE X 2,   STAT    Comments:  INITIATE ANTIBIOTICS WITHIN 1 HOUR AFTER BLOOD CULTURES DRAWN.  If unable to obtain blood cultures, call MD immediately regarding antibiotic instructions.    08/02/17 2229   08/02/17 2229  Lactic acid, plasma  STAT Now then every 3 hours,   STAT     08/02/17 2229   08/02/17 2229  Procalcitonin  STAT,    R     08/02/17 2229   08/02/17 2045  Urine culture  Add-on,   STAT     08/02/17 2046   Signed and Held  Creatinine, serum  (enoxaparin (LOVENOX)    CrCl >/= 30 ml/min)  Weekly,   R    Comments:  while on enoxaparin therapy    Signed and Held   Signed and Held  Basic metabolic panel  Tomorrow morning,   R     Signed and Held   Signed and Held  CBC  Tomorrow morning,   R     Signed and Held   Signed and Held  Lactate dehydrogenase  Tomorrow morning,   R     Signed and Held      Vitals/Pain Today's Vitals   08/02/17 2130 08/02/17 2200 08/02/17 2230 08/02/17 2300  BP: (!) 152/75 105/68 101/73 119/72  Pulse: 83 82 83 86  Resp: 15 (!) 23 (!) 21 18  Temp:      TempSrc:      SpO2: 98% 98% 98% 98%  Weight:      Height:        Isolation Precautions No active isolations  Medications Medications  ceFEPIme (MAXIPIME) 2 g in dextrose 5 % 50 mL IVPB (not administered)  sodium chloride 0.9 % bolus 250 mL (0 mLs Intravenous Stopped 08/02/17 1710)  iopamidol (ISOVUE-370) 76 % injection 80 mL (80 mLs Intravenous Contrast Given 08/02/17 1712)  sodium chloride 0.9 % bolus 1,000 mL (1,000 mLs Intravenous New Bag/Given 08/02/17 2302)    Mobility walks with device

## 2017-08-02 NOTE — ED Notes (Signed)
Patient transported to CT 

## 2017-08-02 NOTE — ED Provider Notes (Signed)
[facility] DEPT Provider Note   CSN: [id number] Arrival date & time: 08/02/17  1437     History   Chief Complaint Chief Complaint  Patient presents with  . Fluid Retention  . Shortness of Breath    HPI Derrick Rivera is a 83 y.o. male presenting with abdominal distention and shortness of breath.  Patient states that since surgery on 7/30, he has had issues with abdominal distention. He was evaluated in the emergency room on 8/25 for shortness of breath, and admitted to the hospital for possible pneumonia versus CHF. Patient discharged home with instructions to follow-up with cardiology. On cardiology follow-up, patient was found to have abd irmness. Cards ordered CT abdomen on 9/4.  Patient presenting today for continued shortness of breath, abdominal distention, and decreased appetite. Patient states he tries to make himself eat, but he cannot handle more than a few swallows at a time. He has had persistent weight loss (~1lb/day x 5 days). He reports sweating in the afternoons and at night, but denies fevers or chills. He has had hematuria x2 days, and dysuria, which is baseline. Decreased frequency of urination. Decreased BMs. Pt had a suppository today which prompted a BM just PTA, no blood in stool. Stool was reported to be runny. He denies chest pain, cough, sore throat, nausea, vomiting. Family states today patient appeared more altered and tired.  HPI  Past Medical History:  Diagnosis Date  . Allergic rhinitis   . Bilateral carotid artery stenosis    per last duplex [id number]  RICA 09-32%,  LICA 6-71%  . Bladder cancer [facility]) urologist-  dr Alyson Ingles   Non-invasise High Grade  . Chronic cough    ? lisinopril intolerence  . Coronary artery disease cardiologiat-  dr Shelva Majestic   hx PCI and stenting 2003/  per cardiac cath total occlusion RCA w/ collaterals 1995  . Diverticulosis of colon   . History of adenomatous polyp of colon    tubular adenoma's 1992; 1993; 1997; 2007   . History of colonic polyps   . History of nonmelanoma skin cancer    BCC and SCC right side nose  s/p moh's reseciton and reconstruction 2013/  left ear  01/ 2017/   06/ 2017 in office excision 2 areas on scalp  . History of TIAs takes ASA   pt states has a few what he thinks ia tia's, states last time with symptoms of veritgo and thought process was 04/ 2017  . HTN (hypertension)   . Hypercholesterolemia   . Non-Hodgkin lymphoma, unspecified, unspecified site [facility]) oncologist-  dr Benay Spice--  pt in clinical remission since 2000   dx 1996  no specific therapy since 2000  . OA (osteoarthritis)   . OSA (obstructive sleep apnea)    moderate per study 08-10-2013--  intolerant cpap  . Prostate nodule   . Thrombocytopenia (HCC)    chronic mild  . Wears glasses     Patient Active Problem List   Diagnosis Date Noted  . SOB (shortness of breath) 07/31/2017  . Diastolic CHF, chronic ([place])   . Pneumonia 07/25/2017  . Bladder cancer ([place]) 06/29/2017  . Prostate nodule 08/10/2015  . Obstructive sleep apnea hypopnea, moderate 09/16/2014  . Hyperlipidemia LDL goal <70 09/16/2014  . Basal cell carcinoma 07/31/2014  . Laceration 06/13/2014  . Herpes zoster 03/30/2012  . NON-HODGKIN'S LYMPHOMA 07/09/2008  . THROMBOCYTOPENIA 07/09/2008  . Allergic rhinitis 06/27/2008  . COLONIC POLYPS 06/26/2008  . Essential hypertension 06/26/2008  . Coronary artery disease  06/26/2008  . Chronic venous insufficiency 06/26/2008  . GERD 06/26/2008  . DEGENERATIVE JOINT DISEASE 06/26/2008    Past Surgical History:  Procedure Laterality Date  . CARDIOVASCULAR STRESS TEST  07-25-2016 dr Shelva Majestic   Intermediate risk  nuclear study w/ a medium defect of moderate severity present in the basal inferolateral, mid inferlateral , and apical inferior location-- Findings consistent w/ ischemia and prior MI w/ per-infarct ischemia-- Per Dr [name] mild inferior ischemia; similar defect in 2014 study but less scar on  present study:  Nuclear stress EF 52%  . CATARACT EXTRACTION W/ INTRAOCULAR LENS  IMPLANT, BILATERAL  2013  . CORONARY ANGIOPLASTY  1995   total occlusion RCA w/ collaterals;  PTCA 2nd marginal LCFX  . CORONARY ANGIOPLASTY WITH STENT PLACEMENT  01-05-2002  dr Shelva Majestic   Complex high-speed rotational atherectomy of entire LAD multiple sites and stenting proximal and mid LAD/  no re-stenosis in prior CFX marginal dilatation site and RCA occluded w/ left to right collaterals,  ef 57%  . CYSTOSCOPY W/ RETROGRADES Bilateral 12/22/2016   Procedure: CYSTOSCOPY WITH RETROGRADE PYELOGRAM;  Surgeon: Cleon Gustin, MD;  Location: [facility];  Service: Urology;  Laterality: Bilateral;  . CYSTOSCOPY W/ URETERAL STENT PLACEMENT Bilateral 06/29/2017   Procedure: CYSTOSCOPY WITH RETROGRADE PYELOGRAM/URETERAL BILATERAL STENT PLACEMENT;  Surgeon: Cleon Gustin, MD;  Location: [facility];  Service: Urology;  Laterality: Bilateral;  . CYSTOSCOPY WITH BIOPSY N/A 06/29/2017   Procedure: CYSTOSCOPY WITH BIOPSY;  Surgeon: Cleon Gustin, MD;  Location: [facility];  Service: Urology;  Laterality: N/A;  . CYSTOSCOPY/RETROGRADE/URETEROSCOPY/STONE EXTRACTION WITH BASKET Bilateral 05/05/2016   Procedure: CYSTOSCOPY WITH RETROGRADE PYELOGRAM;  Surgeon: Cleon Gustin, MD;  Location: [facility];  Service: Urology;  Laterality: Bilateral;  . MOHS SURGERY  2013   at [facility]   nose -- w/ Reconstructive surgery same year  . MOHS SURGERY  01/ 2017   left ear  . REFRACTIVE SURGERY Left 05/ 2017  . TONSILLECTOMY  age 35  . TOTAL KNEE ARTHROPLASTY Left 03-07-2002  . TOTAL SHOULDER ARTHROPLASTY Left 04-30-2005  . TRANSTHORACIC ECHOCARDIOGRAM  04-29-2012   borderline inferior wall hypokinesis, ef 65%/  mild dilated RV/  mild to moderate AV calcification without stenosis/  mild AR, TR, and  MR/  moderate PR/  mild aortic root dilatation  . TRANSURETHRAL RESECTION OF BLADDER TUMOR N/A 12/22/2016   Procedure:  TRANSURETHRAL RESECTION OF BLADDER TUMOR (TURBT);  Surgeon: Cleon Gustin, MD;  Location: [facility];  Service: Urology;  Laterality: N/A;  . TRANSURETHRAL RESECTION OF BLADDER TUMOR WITH GYRUS (TURBT-GYRUS) N/A 05/05/2016   Procedure: TRANSURETHRAL RESECTION OF BLADDER TUMOR WITH GYRUS (TURBT-GYRUS);  Surgeon: Cleon Gustin, MD;  Location: [facility];  Service: Urology;  Laterality: N/A;  . TRANSURETHRAL RESECTION OF BLADDER TUMOR WITH GYRUS (TURBT-GYRUS) N/A 06/09/2016   Procedure: TRANSURETHRAL RESECTION OF BLADDER TUMOR WITH GYRUS (TURBT-GYRUS);  Surgeon: Cleon Gustin, MD;  Location: [facility];  Service: Urology;  Laterality: N/A;       Home Medications    Prior to Admission medications   Medication Sig Start Date End Date Taking? Authorizing Provider  acetaminophen (TYLENOL) 500 MG tablet Take 250 mg by mouth 2 (two) times daily.    [provider]  Ascorbic Acid (VITAMIN C) 500 MG tablet Take 500 mg by mouth daily.     [provider]  aspirin EC 325 MG tablet Take 162.5 mg by mouth every evening.  [provider]  atorvastatin (LIPITOR) 20 MG tablet TAKE 1 TABLET (20 MG TOTAL) BY MOUTH DAILY. Patient taking differently: TAKE 1 TABLET (20 MG TOTAL) BY MOUTH DAILY AT NIGHT 04/02/17   Troy Sine, MD  Azelastine-Fluticasone 137-50 MCG/ACT SUSP Place 1 spray into the nose daily as needed. 01/23/17   [name], MD  feeding supplement, ENSURE ENLIVE, (ENSURE ENLIVE) LIQD Take 237 mLs by mouth 2 (two) times daily between meals. 07/29/17   Lavina Hamman, MD  furosemide (LASIX) 40 MG tablet Take 1 tablet (40 mg total) by mouth daily. 07/30/17   Lavina Hamman, MD  prochlorperazine (COMPAZINE) 10 MG tablet Take 1 tablet (10 mg total) by mouth every 6 (six) hours as needed for nausea or vomiting. 07/15/17   Wyatt Portela, MD  tamsulosin (FLOMAX) 0.4 MG CAPS capsule Take 1 capsule (0.4 mg total) by mouth daily after  supper. 07/20/17   Hayden Pedro, PA-C  traMADol (ULTRAM) 50 MG tablet Take 1 tablet (50 mg total) by mouth every 4 (four) hours as needed. 06/29/17 06/29/18  Cleon Gustin, MD  verapamil (CALAN-SR) 180 MG CR tablet Take 1 tablet (180 mg total) by mouth at bedtime. 02/16/17   Troy Sine, MD    Family History Family History  Problem Relation Age of Onset  . Pancreatic cancer Mother   . Heart disease Father     Social History Social History  Substance Use Topics  . Smoking status: Former Smoker    Packs/day: 2.00    Years: 20.00    Types: Cigarettes    Quit date: 12/01/1966  . Smokeless tobacco: Never Used  . Alcohol use No     Allergies   Penicillins   Review of Systems Review of Systems  Constitutional: Positive for appetite change and unexpected weight change. Negative for chills.  HENT: Negative for congestion and sore throat.   Eyes: Negative for photophobia and visual disturbance.  Respiratory: Positive for shortness of breath. Negative for cough and chest tightness.   Cardiovascular: Negative for chest pain, palpitations and leg swelling.  Gastrointestinal: Positive for abdominal distention, constipation and diarrhea. Negative for abdominal pain, blood in stool, nausea and vomiting.  Genitourinary: Positive for decreased urine volume, dysuria (baseline) and hematuria. Negative for frequency.  Musculoskeletal: Negative for back pain and neck pain.  Skin: Negative for wound.  Neurological: Positive for weakness. Negative for dizziness and light-headedness.  Hematological: Does not bruise/bleed easily.  Psychiatric/Behavioral: Negative for agitation, confusion and decreased concentration.     Physical Exam Updated Vital Signs BP 104/72   Pulse 90   Temp (!) 97.5 F (36.4 C) (Oral)   Resp 17   Ht 5\' 10"  (1.778 m)   Wt 73.5 kg (162 lb)   SpO2 96%   BMI 23.24 kg/m   Physical Exam  Constitutional: He is oriented to person, place, and time. He  appears well-developed and well-nourished. No distress.  HENT:  Head: Normocephalic and atraumatic.  Mouth/Throat: Uvula is midline and oropharynx is clear and moist. Mucous membranes are dry.  Eyes: EOM are normal.  Neck: Normal range of motion.  Cardiovascular: Normal rate, regular rhythm and intact distal pulses.   Pulmonary/Chest: Effort normal and breath sounds normal. No respiratory distress. He has no wheezes. He has no rales. He exhibits no tenderness.  Abdominal: Soft. Bowel sounds are normal. He exhibits distension. There is tenderness. There is no rebound, no CVA tenderness, no tenderness at McBurney's point and negative Murphy's  sign.    Musculoskeletal: Normal range of motion.  Neurological: He is alert and oriented to person, place, and time.  Skin: Skin is warm and dry.  Psychiatric: He has a normal mood and affect.  Nursing note and vitals reviewed.    ED Treatments / Results  Labs (all labs ordered are listed, but only abnormal results are displayed) Labs Reviewed  CBC WITH DIFFERENTIAL/PLATELET - Abnormal; Notable for the following:       Result Value   WBC 10.6 (*)    Neutro Abs 8.6 (*)    Monocytes Absolute 1.1 (*)    All other components within normal limits  COMPREHENSIVE METABOLIC PANEL - Abnormal; Notable for the following:    Chloride 96 (*)    BUN 46 (*)    Creatinine, Ser 1.48 (*)    Total Protein 5.9 (*)    Albumin 3.0 (*)    AST 75 (*)    GFR calc non Af Amer 40 (*)    GFR calc Af Amer 46 (*)    Anion gap 19 (*)    All other components within normal limits  URINALYSIS, ROUTINE W REFLEX MICROSCOPIC - Abnormal; Notable for the following:    APPearance CLOUDY (*)    Specific Gravity, Urine 1.036 (*)    Hgb urine dipstick LARGE (*)    Ketones, ur 5 (*)    Protein, ur 100 (*)    Leukocytes, UA MODERATE (*)    Squamous Epithelial / LPF 0-5 (*)    All other components within normal limits  LACTIC ACID, PLASMA - Abnormal; Notable for the  following:    Lactic Acid, Venous 3.5 (*)    All other components within normal limits  I-STAT CG4 LACTIC ACID, ED - Abnormal; Notable for the following:    Lactic Acid, Venous 5.65 (*)    All other components within normal limits  I-STAT CG4 LACTIC ACID, ED - Abnormal; Notable for the following:    Lactic Acid, Venous 3.90 (*)    All other components within normal limits  URINE CULTURE  CULTURE, BLOOD (ROUTINE X 2)  CULTURE, BLOOD (ROUTINE X 2)  LIPASE, BLOOD  PROCALCITONIN  LACTIC ACID, PLASMA  BASIC METABOLIC PANEL  CBC  LACTATE DEHYDROGENASE    EKG  EKG Interpretation None       Radiology Ct Angio Chest Pe W/cm &/or Wo Cm  Result Date: 08/02/2017 CLINICAL DATA:  83 year old male with acute shortness of breath, abdominal and pelvic distention and unexpected weight loss. History of bladder cancer undergoing BCG treatment. History of non-Hodgkin's lymphoma. EXAM: CT ANGIOGRAPHY CHEST CT ABDOMEN AND PELVIS WITH CONTRAST TECHNIQUE: Multidetector CT imaging of the chest was performed using the standard protocol during bolus administration of intravenous contrast. Multiplanar CT image reconstructions and MIPs were obtained to evaluate the vascular anatomy. Multidetector CT imaging of the abdomen and pelvis was performed using the standard protocol during bolus administration of intravenous contrast. CONTRAST:  80 cc intravenous Isovue 370 COMPARISON:  06/25/2017 abdominal and pelvic CT, 12/21/2008 chest CT and other studies. FINDINGS: CTA CHEST FINDINGS Cardiovascular: This is a technically satisfactory study. No pulmonary emboli are identified. Cardiomegaly and heavy coronary artery calcifications again noted. Diffuse aneurysm of the ascending aorta again noted measuring 4 cm in greatest diameter. Aortic atherosclerotic calcifications are again noted. A small pericardial effusion is now identified. Mediastinum/Nodes: New mildly enlarged enlarged mediastinal and bilateral hilar lymph nodes  are identified as follows (series 5): A 1.2 cm AP window node (image  28) A 1.6 x 2.5 cm subcarinal node (image 37) a 1.5 x 2.5 cm lower left hilar node (image 42) Other new much smaller lymph nodes within the mediastinum are present. Lungs/Pleura: A moderate to large left pleural effusion and small right pleural effusion are identified. Bilateral lower lobe atelectasis identified.No definite pulmonary mass is noted. There is no evidence of pneumothorax. Musculoskeletal: No acute or suspicious abnormality. Left shoulder arthroplasty and degenerative changes in the right shoulder are noted. Review of the MIP images confirms the above findings. CT ABDOMEN and PELVIS FINDINGS Hepatobiliary: No acute or focal hepatic abnormality. Cholelithiasis identified. No biliary dilatation. Pancreas: Pancreatic atrophy without other significant abnormality Spleen: Unremarkable Adrenals/Urinary Tract: Bilateral ureteral stents are now identified with tips in the renal pelvis and bladder. Severe right and moderate left hydronephrosis again noted and not significantly changed. Bilateral renal cortical atrophy again identified. The adrenal glands are unremarkable. Diffuse bladder wall thickening is again noted. Stomach/Bowel: Irregular wall thickening of the proximal sigmoid colon appears increased since the prior study. There is no evidence of bowel obstruction, pneumoperitoneum or focal abscess. Vascular/Lymphatic: Aortic atherosclerosis. New and enlarging adenopathy within the retroperitoneum, abdomen and mesenteric noted as follows (series 6): A 2.1 cm left retroperitoneal node (image 33), previously 1.1 cm. A 2 cm left retroperitoneal node (image 41), previously 1.4 cm. Reproductive: Prostate unchanged Other: Increasing diffuse ill-defined soft tissue within the pelvic fat, omentum and mesenteric noted. A small amount of ascites adjacent to the liver now noted. Heavy calcification within the mesenteric again identified.  Musculoskeletal: No acute abnormalities. Review of the MIP images confirms the above findings. IMPRESSION: 1. Increasing and new adenopathy within the chest, abdomen and pelvis and new ill-defined soft tissue opacities within the abdomen, omentum, mesenteric and pelvis - highly suspicious for increasing malignancy/metastatic disease and/or lymphoma. Diffuse bladder wall thickening again noted. 2. Moderate left pleural effusion, small right pleural effusion, small pericardial effusion and small amount of ascites. 3. Increasing diffuse wall thickening of the proximal sigmoid colon -favor metastatic disease over primary malignancy. 4. No evidence of pulmonary emboli. 5. Unchanged 4 cm ascending thoracic aortic aneurysm 6. Bilateral urinary stents with unchanged severe right and moderate left hydronephrosis. 7. Cardiomegaly and coronary artery disease. Electronically Signed   By: Margarette Canada M.D.   On: 08/02/2017 18:17   Ct Abdomen Pelvis W Contrast  Result Date: 08/02/2017 CLINICAL DATA:  83 year old male with acute shortness of breath, abdominal and pelvic distention and unexpected weight loss. History of bladder cancer undergoing BCG treatment. History of non-Hodgkin's lymphoma. EXAM: CT ANGIOGRAPHY CHEST CT ABDOMEN AND PELVIS WITH CONTRAST TECHNIQUE: Multidetector CT imaging of the chest was performed using the standard protocol during bolus administration of intravenous contrast. Multiplanar CT image reconstructions and MIPs were obtained to evaluate the vascular anatomy. Multidetector CT imaging of the abdomen and pelvis was performed using the standard protocol during bolus administration of intravenous contrast. CONTRAST:  80 cc intravenous Isovue 370 COMPARISON:  06/25/2017 abdominal and pelvic CT, 12/21/2008 chest CT and other studies. FINDINGS: CTA CHEST FINDINGS Cardiovascular: This is a technically satisfactory study. No pulmonary emboli are identified. Cardiomegaly and heavy coronary artery  calcifications again noted. Diffuse aneurysm of the ascending aorta again noted measuring 4 cm in greatest diameter. Aortic atherosclerotic calcifications are again noted. A small pericardial effusion is now identified. Mediastinum/Nodes: New mildly enlarged enlarged mediastinal and bilateral hilar lymph nodes are identified as follows (series 5): A 1.2 cm AP window node (image 28) A 1.6 x 2.5 cm  subcarinal node (image 37) a 1.5 x 2.5 cm lower left hilar node (image 42) Other new much smaller lymph nodes within the mediastinum are present. Lungs/Pleura: A moderate to large left pleural effusion and small right pleural effusion are identified. Bilateral lower lobe atelectasis identified.No definite pulmonary mass is noted. There is no evidence of pneumothorax. Musculoskeletal: No acute or suspicious abnormality. Left shoulder arthroplasty and degenerative changes in the right shoulder are noted. Review of the MIP images confirms the above findings. CT ABDOMEN and PELVIS FINDINGS Hepatobiliary: No acute or focal hepatic abnormality. Cholelithiasis identified. No biliary dilatation. Pancreas: Pancreatic atrophy without other significant abnormality Spleen: Unremarkable Adrenals/Urinary Tract: Bilateral ureteral stents are now identified with tips in the renal pelvis and bladder. Severe right and moderate left hydronephrosis again noted and not significantly changed. Bilateral renal cortical atrophy again identified. The adrenal glands are unremarkable. Diffuse bladder wall thickening is again noted. Stomach/Bowel: Irregular wall thickening of the proximal sigmoid colon appears increased since the prior study. There is no evidence of bowel obstruction, pneumoperitoneum or focal abscess. Vascular/Lymphatic: Aortic atherosclerosis. New and enlarging adenopathy within the retroperitoneum, abdomen and mesenteric noted as follows (series 6): A 2.1 cm left retroperitoneal node (image 33), previously 1.1 cm. A 2 cm left  retroperitoneal node (image 41), previously 1.4 cm. Reproductive: Prostate unchanged Other: Increasing diffuse ill-defined soft tissue within the pelvic fat, omentum and mesenteric noted. A small amount of ascites adjacent to the liver now noted. Heavy calcification within the mesenteric again identified. Musculoskeletal: No acute abnormalities. Review of the MIP images confirms the above findings. IMPRESSION: 1. Increasing and new adenopathy within the chest, abdomen and pelvis and new ill-defined soft tissue opacities within the abdomen, omentum, mesenteric and pelvis - highly suspicious for increasing malignancy/metastatic disease and/or lymphoma. Diffuse bladder wall thickening again noted. 2. Moderate left pleural effusion, small right pleural effusion, small pericardial effusion and small amount of ascites. 3. Increasing diffuse wall thickening of the proximal sigmoid colon -favor metastatic disease over primary malignancy. 4. No evidence of pulmonary emboli. 5. Unchanged 4 cm ascending thoracic aortic aneurysm 6. Bilateral urinary stents with unchanged severe right and moderate left hydronephrosis. 7. Cardiomegaly and coronary artery disease. Electronically Signed   By: Margarette Canada M.D.   On: 08/02/2017 18:17    Procedures Procedures (including critical care time)  Medications Ordered in ED Medications  feeding supplement (ENSURE ENLIVE) (ENSURE ENLIVE) liquid 237 mL (not administered)  tamsulosin (FLOMAX) capsule 0.4 mg (not administered)  aspirin EC tablet 162.5 mg (not administered)  atorvastatin (LIPITOR) tablet 20 mg (not administered)  verapamil (CALAN-SR) CR tablet 180 mg (not administered)  acetaminophen (TYLENOL) tablet 650 mg (not administered)    Or  acetaminophen (TYLENOL) suppository 650 mg (not administered)  ondansetron (ZOFRAN) tablet 4 mg (not administered)    Or  ondansetron (ZOFRAN) injection 4 mg (not administered)  ceFEPIme (MAXIPIME) 2 g in dextrose 5 % 50 mL IVPB (not  administered)  sodium chloride 0.9 % bolus 250 mL (0 mLs Intravenous Stopped 08/02/17 1710)  iopamidol (ISOVUE-370) 76 % injection 80 mL (80 mLs Intravenous Contrast Given 08/02/17 1712)  ceFEPIme (MAXIPIME) 2 g in dextrose 5 % 50 mL IVPB (0 g Intravenous Stopped 08/02/17 2346)  sodium chloride 0.9 % bolus 1,000 mL (1,000 mLs Intravenous New Bag/Given 08/02/17 2302)     Initial Impression / Assessment and Plan / ED Course  I have reviewed the triage vital signs and the nursing notes.  Pertinent labs & imaging results that were  available during my care of the patient were reviewed by me and considered in my medical decision making (see chart for details).     Patient presenting with continued abdominal distention, shortness of breath, and decreased appetite. Physical exam shows patient in a sexually tachycardic with a soft blood pressure. Obvious distention of abdomen with tenderness and firmness of mid abdominal area. Will order CBC, CMP, lactate, UA, and lipase. Per chart review, patient had elevated d-dimer at last cardiology appointment, will order CTA to rule out PE. CT abdomen and pelvis for further evaluation of abdomen.   Lactate elevated at greater than 5. CMP shows mild worsening of kidney function. CBC shows slight elevation of white count at 10.6. UA shows moderate leuks without bacteria or nitrites, doubt UTI.  CTA negative for PE. CT abdomen shows worsening adenopathy and soft tissue opacities, likely increase malignancies assessment static disease; Moderate left pleural effusion with a small right pleural effusion and small amount of ascites; Thickening of proximal sigmoid colon, possibly metastatic disease; and Unchanged size of ascending thoracic aneurysm and hydronephrosis.  Case discussed with attending, Dr. Alvino Chapel evaluated the patient. Will consult hospitalist.  Hospitalist evaluated the patient, patient to be admitted.   Final Clinical Impressions(s) / ED Diagnoses   Final  diagnoses:  SOB (shortness of breath)  Fluid retention  Pleural effusion  AKI (acute kidney injury) [facility])    New Prescriptions New Prescriptions   No medications on file     Franchot Heidelberg, PA-C 08/03/17 0121    Davonna Belling, MD 08/03/17 [phone]

## 2017-08-02 NOTE — ED Notes (Signed)
Please call report to [place], [phone] at [phone]

## 2017-08-02 NOTE — ED Notes (Signed)
PA at bedside.

## 2017-08-03 LAB — BASIC METABOLIC PANEL
ANION GAP: 13 (ref 5–15)
BUN: 44 mg/dL — ABNORMAL HIGH (ref 6–20)
CALCIUM: 8.8 mg/dL — AB (ref 8.9–10.3)
CO2: 27 mmol/L (ref 22–32)
Chloride: 102 mmol/L (ref 101–111)
Creatinine, Ser: 1.27 mg/dL — ABNORMAL HIGH (ref 0.61–1.24)
GFR, EST AFRICAN AMERICAN: 56 mL/min — AB (ref >60)
GFR, EST NON AFRICAN AMERICAN: 48 mL/min — AB (ref >60)
GLUCOSE: 63 mg/dL — AB (ref 65–99)
POTASSIUM: 3.4 mmol/L — AB (ref 3.5–5.1)
Sodium: 142 mmol/L (ref 135–145)

## 2017-08-03 LAB — CBC
HEMATOCRIT: 39 % (ref 39.0–52.0)
HEMOGLOBIN: 13.1 g/dL (ref 13.0–17.0)
MCH: 31.6 pg (ref 26.0–34.0)
MCHC: 33.6 g/dL (ref 30.0–36.0)
MCV: 94 fL (ref 78.0–100.0)
Platelets: 213 10*3/uL (ref 150–400)
RBC: 4.15 MIL/uL — AB (ref 4.22–5.81)
RDW: 14.5 % (ref 11.5–15.5)
WBC: 10.3 10*3/uL (ref 4.0–10.5)

## 2017-08-03 LAB — LACTIC ACID, PLASMA
LACTIC ACID, VENOUS: 3 mmol/L — AB (ref 0.5–1.9)
LACTIC ACID, VENOUS: 3.6 mmol/L — AB (ref 0.5–1.9)

## 2017-08-03 LAB — PROCALCITONIN: PROCALCITONIN: 0.1 ng/mL

## 2017-08-03 LAB — LACTATE DEHYDROGENASE: LDH: 627 U/L — AB (ref 98–192)

## 2017-08-03 MED ORDER — POTASSIUM CHLORIDE 10 MEQ/100ML IV SOLN
10.0000 meq | INTRAVENOUS | Status: DC
  Filled 2017-08-03 (×3): qty 100

## 2017-08-03 MED ORDER — SODIUM CHLORIDE 0.9 % IV BOLUS (SEPSIS)
1000.0000 mL | Freq: Once | INTRAVENOUS | Status: AC
  Administered 2017-08-03: 1000 mL via INTRAVENOUS

## 2017-08-03 MED ORDER — POTASSIUM CHLORIDE CRYS ER 20 MEQ PO TBCR
40.0000 meq | EXTENDED_RELEASE_TABLET | Freq: Once | ORAL | Status: AC
  Administered 2017-08-03: 40 meq via ORAL
  Filled 2017-08-03: qty 2

## 2017-08-03 NOTE — Progress Notes (Signed)
CRITICAL VALUE ALERT  Critical Value:  lactic acid 3.0  Date & Time Notied:  07/03/17@0546   Provider Notified: Yes  Orders Received/Actions taken: None

## 2017-08-03 NOTE — Progress Notes (Signed)
CRITICAL VALUE ALERT  Critical Value:  3.5  Date & Time Notied:  08/02/17@2343   Provider Notified: Yes  Orders Received/Actions taken: Intervention in progress

## 2017-08-03 NOTE — Progress Notes (Signed)
CRITICAL VALUE ALERT  Critical Value:  Lactic acid 3.6  Date & Time Notied:  08/03/17@0131   Provider Notified: yes  Orders Received/Actions taken: new order

## 2017-08-03 NOTE — Progress Notes (Signed)
Patient ID: Derrick Rivera, male   DOB: March 13, 1924, 83 y.o.   MRN: [id number]  PROGRESS NOTE    Derrick Rivera  VZC:[id number] DOB: 1924/04/14 DOA: 08/02/2017  PCP: [name], MD   Brief Narrative:  83 year old male with medical history significant for CAD status post PCI 2003, hypertension, heart failure with preserved ejection fraction, TIA, remote history of NHL in remission since 2000. Patient also has more recent diagnosis of bladder cancer in July of this year. He was supposed to start chemotherapy and radiation therapy this coming up week. He started to develop more weakness and shortness of breath as well as lack of appetite. He was recently hospitalized 07/25/2017 through 07/29/2017 for CHF and pneumonia. On admission, patient was slightly hypothermic, tachycardic and tachypneic. Blood work was significant for a lactic acid of 5.65. Creatinine was 1.5. Urinalysis showed moderate leukocytes. CT scan of the chest and pelvis showed progression, doubling in size of retroperitoneal adenopathy and large bladder mass, there is also left pleural effusion. Patient was started on empiric cefepime while awaiting culture results.   Assessment & Plan:   Sepsis / Lactic acidosis / UTI - Sepsis criteria met on admission with hypothermia, tachycardia, tachypnea, leukocytosis, lactic acidosis - Source of infection likely UTI - Continue to trend lactic acid level, lactic acid is 3 this morning - Pro calcitonin is 0.1 - Blood cultures and urine culture are pending - Continue cefepime  Moderate left pleural effusion - Plan for US thoracentesis today   Acute kidney injury - Due to sepsis - Cr improving with IV fluids   Hypokalemia - Due to acute infection - Supplemented - Follow up BMP  Dyslipidemia - Continue Lipitor  Essential hypertension - Continue verapamil  BPH - Continue Flomax   DVT prophylaxis: SCD's  Code Status: full code  Family Communication: no family at the bedside    Disposition Plan: plan for thoracentesis this am   Consultants:   PCT  Procedures:   None   Antimicrobials:   Cefepime 08/02/2017 -->    Subjective: No overnight events.  Objective: Vitals:   08/02/17 2230 08/02/17 2300 08/02/17 2351 08/03/17 0506  BP: 101/73 119/72 130/71 116/74  Pulse: 83 86 85 83  Resp: (!) _0 Temp:   98 F (36.7 C) 97.7 F (36.5 C)  TempSrc:   Axillary Oral  SpO2: 98% 98% 100% 100%  Weight:   73.4 kg (161 lb 13.1 oz) 73.4 kg (161 lb 13.1 oz)  Height:   _1  (1.778 m)     Intake/Output Summary (Last 24 hours) at 08/03/17 0910 Last data filed at 08/02/17 1710  Gross per 24 hour  Intake              250 ml  Output                0 ml  Net              250 ml   Filed Weights   08/02/17 1546 08/02/17 2351 08/03/17 0506  Weight: 73.5 kg (162 lb) 73.4 kg (161 lb 13.1 oz) 73.4 kg (161 lb 13.1 oz)    Examination:  General exam: Appears calm and comfortable  Respiratory system: diminished breath sounds on left, no wheezing  Cardiovascular system: S1 & S2 heard, Rate controlled  Gastrointestinal system: Abdomen is nondistended, soft and nontender.  Central nervous system: No focal deficit Extremities: No tenderness, no edema Skin: No rashes, lesions or ulcers Psychiatry: Not agitated  or restless   Data Reviewed: I have personally reviewed following labs and imaging studies  CBC:  Recent Labs Lab 07/29/17 0347 08/02/17 1610 08/03/17 0444  WBC 8.9 10.6* 10.3  NEUTROABS  --  8.6*  --   HGB 13.3 14.3 13.1  HCT 39.0 41.1 39.0  MCV 94.4 94.1 94.0  PLT 209 266 751   Basic Metabolic Panel:  Recent Labs Lab 07/28/17 0346 07/29/17 0347 08/02/17 1610 08/03/17 0444  NA 138 140 141 142  K 3.8 3.8 3.8 3.4*  CL 99* 100* 96* 102  CO2 _0 GLUCOSE 86 83 93 63*  BUN 28* 26* 46* 44*  CREATININE 1.08 1.04 1.48* 1.27*  CALCIUM 9.1 9.2 9.9 8.8*   GFR: Estimated Creatinine Clearance: 39.9 mL/min (A) (by C-G formula  based on SCr of 1.27 mg/dL (H)). Liver Function Tests:  Recent Labs Lab 08/02/17 1610  AST 75*  ALT 29  ALKPHOS 63  BILITOT 1.0  PROT 5.9*  ALBUMIN 3.0*    Recent Labs Lab 08/02/17 1610  LIPASE 24   No results for input(s): AMMONIA in the last 168 hours. Coagulation Profile: No results for input(s): INR, PROTIME in the last 168 hours. Cardiac Enzymes: No results for input(s): CKTOTAL, CKMB, CKMBINDEX, TROPONINI in the last 168 hours. BNP (last 3 results) No results for input(s): PROBNP in the last 8760 hours. HbA1C: No results for input(s): HGBA1C in the last 72 hours. CBG: No results for input(s): GLUCAP in the last 168 hours. Lipid Profile: No results for input(s): CHOL, HDL, LDLCALC, TRIG, CHOLHDL, LDLDIRECT in the last 72 hours. Thyroid Function Tests: No results for input(s): TSH, T4TOTAL, FREET4, T3FREE, THYROIDAB in the last 72 hours. Anemia Panel: No results for input(s): VITAMINB12, FOLATE, FERRITIN, TIBC, IRON, RETICCTPCT in the last 72 hours. Urine analysis:    Component Value Date/Time   COLORURINE YELLOW 08/02/2017 1603   APPEARANCEUR CLOUDY (A) 08/02/2017 1603   LABSPEC 1.036 (H) 08/02/2017 1603   LABSPEC 1.030 07/20/2017 1427   PHURINE 5.0 08/02/2017 1603   GLUCOSEU NEGATIVE 08/02/2017 1603   GLUCOSEU Negative 07/20/2017 1427   HGBUR LARGE (A) 08/02/2017 1603   BILIRUBINUR NEGATIVE 08/02/2017 1603   BILIRUBINUR Negative 07/20/2017 1427   KETONESUR 5 (A) 08/02/2017 1603   PROTEINUR 100 (A) 08/02/2017 1603   UROBILINOGEN 0.2 07/20/2017 1427   NITRITE NEGATIVE 08/02/2017 1603   LEUKOCYTESUR MODERATE (A) 08/02/2017 1603   LEUKOCYTESUR Moderate 07/20/2017 1427   Sepsis Labs: _1 (procalcitonin:4,lacticidven:4)   )No results found for this or any previous visit (from the past 240 hour(s)).    Radiology Studies: Ct Angio Chest Pe W/cm &/or Wo Cm Result Date: 08/02/2017 1. Increasing and new adenopathy within the chest, abdomen and pelvis  and new ill-defined soft tissue opacities within the abdomen, omentum, mesenteric and pelvis - highly suspicious for increasing malignancy/metastatic disease and/or lymphoma. Diffuse bladder wall thickening again noted. 2. Moderate left pleural effusion, small right pleural effusion, small pericardial effusion and small amount of ascites. 3. Increasing diffuse wall thickening of the proximal sigmoid colon -favor metastatic disease over primary malignancy. 4. No evidence of pulmonary emboli. 5. Unchanged 4 cm ascending thoracic aortic aneurysm 6. Bilateral urinary stents with unchanged severe right and moderate left hydronephrosis. 7. Cardiomegaly and coronary artery disease. Electronically Signed   By: Margarette Canada M.D.   On: 08/02/2017 18:17   Ct Abdomen Pelvis W Contrast Result Date: 08/02/2017 1. Increasing and new adenopathy within the chest, abdomen and pelvis and new  ill-defined soft tissue opacities within the abdomen, omentum, mesenteric and pelvis - highly suspicious for increasing malignancy/metastatic disease and/or lymphoma. Diffuse bladder wall thickening again noted. 2. Moderate left pleural effusion, small right pleural effusion, small pericardial effusion and small amount of ascites. 3. Increasing diffuse wall thickening of the proximal sigmoid colon -favor metastatic disease over primary malignancy. 4. No evidence of pulmonary emboli. 5. Unchanged 4 cm ascending thoracic aortic aneurysm 6. Bilateral urinary stents with unchanged severe right and moderate left hydronephrosis. 7. Cardiomegaly and coronary artery disease. Electronically Signed   By: Margarette Canada M.D.   On: 08/02/2017 18:17     Scheduled Meds: . aspirin EC  162.5 mg Oral QPM  . atorvastatin  20 mg Oral q1800  . tamsulosin  0.4 mg Oral QPC supper  . verapamil  180 mg Oral QHS   Continuous Infusions: . ceFEPime (MAXIPIME) IV       LOS: 1 day    Time spent: 25 minutes  Greater than 50% of the time spent on counseling and  coordinating the care.   Leisa Lenz, MD [facility] Pager [phone]  If 7PM-7AM, please contact night-coverage [email or web address] Password [email or web address] 08/03/2017, 9:10 AM

## 2017-08-04 ENCOUNTER — Telehealth: Payer: Self-pay | Admitting: Family Medicine

## 2017-08-04 ENCOUNTER — Ambulatory Visit (HOSPITAL_COMMUNITY): Payer: Medicare HMO

## 2017-08-04 ENCOUNTER — Inpatient Hospital Stay (HOSPITAL_COMMUNITY): Payer: Medicare HMO

## 2017-08-04 DIAGNOSIS — Z7189 Other specified counseling: Secondary | ICD-10-CM

## 2017-08-04 DIAGNOSIS — J90 Pleural effusion, not elsewhere classified: Secondary | ICD-10-CM

## 2017-08-04 DIAGNOSIS — Z515 Encounter for palliative care: Secondary | ICD-10-CM

## 2017-08-04 LAB — BASIC METABOLIC PANEL
ANION GAP: 14 (ref 5–15)
BUN: 42 mg/dL — ABNORMAL HIGH (ref 6–20)
CALCIUM: 9 mg/dL (ref 8.9–10.3)
CO2: 23 mmol/L (ref 22–32)
Chloride: 103 mmol/L (ref 101–111)
Creatinine, Ser: 1.29 mg/dL — ABNORMAL HIGH (ref 0.61–1.24)
GFR calc Af Amer: 55 mL/min — ABNORMAL LOW (ref >60)
GFR calc non Af Amer: 47 mL/min — ABNORMAL LOW (ref >60)
GLUCOSE: 80 mg/dL (ref 65–99)
Potassium: 4.2 mmol/L (ref 3.5–5.1)
Sodium: 140 mmol/L (ref 135–145)

## 2017-08-04 LAB — CBC
HEMATOCRIT: 42.2 % (ref 39.0–52.0)
HEMOGLOBIN: 14.2 g/dL (ref 13.0–17.0)
MCH: 31.7 pg (ref 26.0–34.0)
MCHC: 33.6 g/dL (ref 30.0–36.0)
MCV: 94.2 fL (ref 78.0–100.0)
Platelets: 237 10*3/uL (ref 150–400)
RBC: 4.48 MIL/uL (ref 4.22–5.81)
RDW: 14.5 % (ref 11.5–15.5)
WBC: 12.5 10*3/uL — ABNORMAL HIGH (ref 4.0–10.5)

## 2017-08-04 LAB — LACTATE DEHYDROGENASE, PLEURAL OR PERITONEAL FLUID: LD FL: 5259 U/L — AB (ref 3–23)

## 2017-08-04 LAB — BODY FLUID CELL COUNT WITH DIFFERENTIAL
LYMPHS FL: 10 %
MONOCYTE-MACROPHAGE-SEROUS FLUID: 88 % (ref 50–90)
NEUTROPHIL FLUID: 2 % (ref 0–25)
WBC FLUID: 6800 uL — AB (ref 0–1000)

## 2017-08-04 LAB — URINE CULTURE: CULTURE: NO GROWTH

## 2017-08-04 LAB — GRAM STAIN

## 2017-08-04 NOTE — Care Management Note (Signed)
Case Management Note  Patient Details  Name: Derrick Rivera MRN: [id number] Date of Birth: March 20, 1924  Subjective/Objective: 83 y/o m admitted w/retroperitoneal adenopathy,large bladder mass, pleural effusion. From home. Daughter support. Full code-palliative care following. PT cons-await recc.                   Action/Plan:d/c plan SNF.   Expected Discharge Date:                  Expected Discharge Plan:  Skilled Nursing Facility  In-House Referral:  Clinical Social Work  Discharge planning Services  CM Consult  Post Acute Care Choice:    Choice offered to:     DME Arranged:    DME Agency:     HH Arranged:    [place] Agency:     Status of Service:  In process, will continue to follow  If discussed at Long Length of Stay Meetings, dates discussed:    Additional Comments:  Dessa Phi, RN 08/04/2017, 3:47 PM

## 2017-08-04 NOTE — Progress Notes (Addendum)
Patient ID: Derrick Rivera, male   DOB: 17-May-1924, 83 y.o.   MRN: [id number]  PROGRESS NOTE    Derrick Rivera  WPV:[id number] DOB: 08/18/1924 DOA: 08/02/2017  PCP: [name], MD   Brief Narrative:  83 year old male with medical history significant for CAD status post PCI 2003, hypertension, heart failure with preserved ejection fraction, TIA, remote history of NHL in remission since 2000. Patient also has more recent diagnosis of bladder cancer in July of this year. He was supposed to start chemotherapy and radiation therapy this coming up week. He started to develop more weakness and shortness of breath as well as lack of appetite. He was recently hospitalized 07/25/2017 through 07/29/2017 for CHF and pneumonia. On admission, patient was slightly hypothermic, tachycardic and tachypneic. Blood work was significant for a lactic acid of 5.65. Creatinine was 1.5. Urinalysis showed moderate leukocytes. CT scan of the chest and pelvis showed progression, doubling in size of retroperitoneal adenopathy and large bladder mass, there is also left pleural effusion. Patient was started on empiric cefepime while awaiting culture results.   Assessment & Plan:   Sepsis / Lactic acidosis / UTI - Sepsis criteria met on admission with hypothermia, tachycardia, tachypnea, leukocytosis, lactic acidosis - Source of infection is likely UTI - Follow up lactic acid in am - Urine cx showed no growth - Blood cx pending - Continue cefepime   Moderate left pleural effusion - IR consulted for thoracentesis for left pleural effusion   History of bladder cancer  - Now based on CT scan seems there is a progression of the disease with increasing and new adenopathy within the chest, abdomen and pelvis and new ill-defined soft tissue opacities within the abdomen, omentum, mesenteric and pelvis - highly suspicious for increasing malignancy/metastatic disease and/or lymphoma. Diffuse bladder wall thickening again noted.  Increasing diffuse wall thickening of the proximal sigmoid colon -favor metastatic disease over primary malignancy.  - Palliative consulted for goals of care - He was supposed to start chemo and radiation but pt even says he does not feel he is strong enough for chemo at this time - I sent note to Dr. Alen Blew that pt is in hospital   Acute kidney injury - Due to sepsis - Cr improved since admission with fluids   Hypokalemia - Due to acute infection - Supplemented and WNL  Dyslipidemia - Continue Lipitor  Essential hypertension - Continue verapamil  BPH - Continue Flomax   DVT prophylaxis: SCD's Code Status: full code Family Communication: daughter at the bedside this am Disposition Plan: plan for thoracentesis (depending on IR schedule)   Consultants:   PCT  IR  Procedures:   None, plan for US thoracentesis   Antimicrobials:   Cefepime 08/02/2017 -->    Subjective: Says he is better since admission but still feels short of breath.   Objective: Vitals:   08/03/17 1452 08/03/17 2010 08/04/17 0522 08/04/17 0523  BP: 140/83 115/79 118/82 118/82  Pulse: 93 98 89 98  Resp: _0 Temp: 97.7 F (36.5 C) 98.3 F (36.8 C) 97.8 F (36.6 C) 97.8 F (36.6 C)  TempSrc: Oral Oral Oral Oral  SpO2: 99% 98% 98% 98%  Weight:    72.8 kg (160 lb 7.9 oz)  Height:        Intake/Output Summary (Last 24 hours) at 08/04/17 1028 Last data filed at 08/04/17 0500  Gross per 24 hour  Intake  170 ml  Output              300 ml  Net             -130 ml   Filed Weights   08/02/17 2351 08/03/17 0506 08/04/17 0523  Weight: 73.4 kg (161 lb 13.1 oz) 73.4 kg (161 lb 13.1 oz) 72.8 kg (160 lb 7.9 oz)    Physical Exam  Constitutional: Appears well-developed, no distress  CVS: RRR, S1/S2 + Pulmonary: diminished breath sounds bilaterally, left more so than right, no wheezing  Abdominal: Soft. BS +,  no distension, tenderness, rebound or guarding.  Musculoskeletal:  Normal range of motion. No tenderness.  Neuro: Alert. Normal reflexes, muscle tone coordination. No cranial nerve deficit. Skin: Skin is warm and dry.  Psychiatric: Normal mood and affect. Behavior, judgment, thought content normal.     Data Reviewed: I have personally reviewed following labs and imaging studies  CBC:  Recent Labs Lab 07/29/17 0347 08/02/17 1610 08/03/17 0444 08/04/17 0425  WBC 8.9 10.6* 10.3 12.5*  NEUTROABS  --  8.6*  --   --   HGB 13.3 14.3 13.1 14.2  HCT 39.0 41.1 39.0 42.2  MCV 94.4 94.1 94.0 94.2  PLT 209 266 213 998   Basic Metabolic Panel:  Recent Labs Lab 07/29/17 0347 08/02/17 1610 08/03/17 0444 08/04/17 0425  NA 140 141 142 140  K 3.8 3.8 3.4* 4.2  CL 100* 96* 102 103  CO2 _0 GLUCOSE 83 93 63* 80  BUN 26* 46* 44* 42*  CREATININE 1.04 1.48* 1.27* 1.29*  CALCIUM 9.2 9.9 8.8* 9.0   GFR: Estimated Creatinine Clearance: 39.2 mL/min (A) (by C-G formula based on SCr of 1.29 mg/dL (H)). Liver Function Tests:  Recent Labs Lab 08/02/17 1610  AST 75*  ALT 29  ALKPHOS 63  BILITOT 1.0  PROT 5.9*  ALBUMIN 3.0*    Recent Labs Lab 08/02/17 1610  LIPASE 24   No results for input(s): AMMONIA in the last 168 hours. Coagulation Profile: No results for input(s): INR, PROTIME in the last 168 hours. Cardiac Enzymes: No results for input(s): CKTOTAL, CKMB, CKMBINDEX, TROPONINI in the last 168 hours. BNP (last 3 results) No results for input(s): PROBNP in the last 8760 hours. HbA1C: No results for input(s): HGBA1C in the last 72 hours. CBG: No results for input(s): GLUCAP in the last 168 hours. Lipid Profile: No results for input(s): CHOL, HDL, LDLCALC, TRIG, CHOLHDL, LDLDIRECT in the last 72 hours. Thyroid Function Tests: No results for input(s): TSH, T4TOTAL, FREET4, T3FREE, THYROIDAB in the last 72 hours. Anemia Panel: No results for input(s): VITAMINB12, FOLATE, FERRITIN, TIBC, IRON, RETICCTPCT in the last 72 hours. Urine  analysis:    Component Value Date/Time   COLORURINE YELLOW 08/02/2017 1603   APPEARANCEUR CLOUDY (A) 08/02/2017 1603   LABSPEC 1.036 (H) 08/02/2017 1603   LABSPEC 1.030 07/20/2017 1427   PHURINE 5.0 08/02/2017 1603   GLUCOSEU NEGATIVE 08/02/2017 1603   GLUCOSEU Negative 07/20/2017 1427   HGBUR LARGE (A) 08/02/2017 1603   BILIRUBINUR NEGATIVE 08/02/2017 1603   BILIRUBINUR Negative 07/20/2017 1427   KETONESUR 5 (A) 08/02/2017 1603   PROTEINUR 100 (A) 08/02/2017 1603   UROBILINOGEN 0.2 07/20/2017 1427   NITRITE NEGATIVE 08/02/2017 1603   LEUKOCYTESUR MODERATE (A) 08/02/2017 1603   LEUKOCYTESUR Moderate 07/20/2017 1427   Sepsis Labs: _1 (procalcitonin:4,lacticidven:4)   ) Recent Results (from the past 240 hour(s))  Urine culture     Status: None  Collection Time: 08/02/17  7:50 PM  Result Value Ref Range Status   Specimen Description URINE, RANDOM  Final   Special Requests NONE  Final   Culture   Final    NO GROWTH Performed at [facility], [place] [address], [place], [place] [address]    Report Status 08/04/2017 FINAL  Final      Radiology Studies: Ct Angio Chest Pe W/cm &/or Wo Cm Result Date: 08/02/2017 1. Increasing and new adenopathy within the chest, abdomen and pelvis and new ill-defined soft tissue opacities within the abdomen, omentum, mesenteric and pelvis - highly suspicious for increasing malignancy/metastatic disease and/or lymphoma. Diffuse bladder wall thickening again noted. 2. Moderate left pleural effusion, small right pleural effusion, small pericardial effusion and small amount of ascites. 3. Increasing diffuse wall thickening of the proximal sigmoid colon -favor metastatic disease over primary malignancy. 4. No evidence of pulmonary emboli. 5. Unchanged 4 cm ascending thoracic aortic aneurysm 6. Bilateral urinary stents with unchanged severe right and moderate left hydronephrosis. 7. Cardiomegaly and coronary artery disease. Electronically Signed    By: Margarette Canada M.D.   On: 08/02/2017 18:17   Ct Abdomen Pelvis W Contrast Result Date: 08/02/2017 1. Increasing and new adenopathy within the chest, abdomen and pelvis and new ill-defined soft tissue opacities within the abdomen, omentum, mesenteric and pelvis - highly suspicious for increasing malignancy/metastatic disease and/or lymphoma. Diffuse bladder wall thickening again noted. 2. Moderate left pleural effusion, small right pleural effusion, small pericardial effusion and small amount of ascites. 3. Increasing diffuse wall thickening of the proximal sigmoid colon -favor metastatic disease over primary malignancy. 4. No evidence of pulmonary emboli. 5. Unchanged 4 cm ascending thoracic aortic aneurysm 6. Bilateral urinary stents with unchanged severe right and moderate left hydronephrosis. 7. Cardiomegaly and coronary artery disease. Electronically Signed   By: Margarette Canada M.D.   On: 08/02/2017 18:17     Scheduled Meds: . aspirin EC  162.5 mg Oral QPM  . atorvastatin  20 mg Oral q1800  . tamsulosin  0.4 mg Oral QPC supper  . verapamil  180 mg Oral QHS   Continuous Infusions: . ceFEPime (MAXIPIME) IV Stopped (08/03/17 2045)     LOS: 2 days    Time spent: 25 minutes  Greater than 50% of the time spent on counseling and coordinating the care.   Leisa Lenz, MD [facility] Pager [phone]  If 7PM-7AM, please contact night-coverage [email or web address] Password [email or web address] 08/04/2017, 10:28 AM

## 2017-08-04 NOTE — Telephone Encounter (Signed)
Pt currently re-admitted.

## 2017-08-04 NOTE — Telephone Encounter (Signed)
°  Maggie Schwalbe, a PT from [facility] called to state that this patient has been admitted to [facility]

## 2017-08-04 NOTE — Consult Note (Signed)
Consultation Note Date: 08/04/2017   Patient Name: Derrick Rivera  DOB: [birth year]  MRN: [id number]  Age / Sex: 83 y.o., male  PCP: [name], MD Referring Physician: Robbie Lis, MD  Reason for Consultation: Establishing goals of care  HPI/Patient Profile: 83 y.o. male   admitted on 08/02/2017  83 year old male with medical history significant for CAD status post PCI 2003, hypertension, heart failure with preserved ejection fraction, TIA, remote history of NHL in remission since 2000. Patient also has more recent diagnosis of bladder cancer in July of this year. He was supposed to start chemotherapy and radiation therapy this coming up week. He started to develop more weakness and shortness of breath as well as lack of appetite. He was recently hospitalized 07/25/2017 through 07/29/2017 for CHF and pneumonia. On admission, patient was slightly hypothermic, tachycardic and tachypneic. Blood work was significant for a lactic acid of 5.65. Creatinine was 1.5. Urinalysis showed moderate leukocytes. CT scan of the chest and pelvis showed progression, doubling in size of retroperitoneal adenopathy and large bladder mass, there is also left pleural effusion. Patient was started on empiric cefepime while awaiting culture results.  CT scan appears to show a progression of the disease with increasing and new adenopathy within the chest, abdomen and pelvis and new ill-defined soft tissue opacities within the abdomen, omentum, mesenteric and pelvis - highly suspicious for increasing malignancy/metastatic disease and/or lymphoma. Diffuse bladder wall thickening again noted. Increasing diffuse wall thickening of the proximal sigmoid colon -favor metastatic disease over primary malignancy.   A palliative consult has been requested for goals of care discussions.   Clinical Assessment and Goals of Care:  the patient is  resting in bed, he is awake alert, attempting to eat his breakfast, he is awaiting his thoracentesis, scheduled for today, he does have pain in his abdomen, generalized, exacerbated on moving, he also has some mild shortness of breath, even at rest, he complains of decreased appetite, he is awake alert, able to answer all questions appropriately, his wife and pastor are at the bedside.   I introduced myself and palliative care as follows: Palliative medicine is specialized medical care for people living with serious illness. It focuses on providing relief from the symptoms and stress of a serious illness. The goal is to improve quality of life for both the patient and the family.  Patient wishes to have his thoracentesis, he is still hopeful that he will be able to start his radiation treatments, he complains of having had a lot of weakness lately. We discussed about goals wishes and values.   NEXT OF KIN  wife, 2 daughters.   SUMMARY OF RECOMMENDATIONS    full code for now.  Patient has completed HCPOA paperwork, designating his 2 daughters to be his co HCPOA agents.  Patient is hopeful that he will be a candidate for ongoing treatments in the near future, with regards to his disease, offered active listening and supportive care.   I then met with his daughter, outside the room,  discussed with daughter Jackelyn Poling about his current condition, recent scans. Jackelyn Poling is well aware of the patient's condition, she had several questions for me regarding the role of hospice services in the future, depending on the patient's trajectory. We discussed hospice philosophy of care in detail. All of her questions answered to the best of my ability, we also discussed about DNR DNI. She wishes to observe the patient's disease trajectory for the next few weeks, review the patient's current condition with his oncologist, and then continue to make appropriate decisions. Offered active listening and supportive care.    Continue current mode of care for now, home with home health versus SNF rehab with palliative on discharge.   Code Status/Advance Care Planning:  Full code    Symptom Management:    as above   Palliative Prophylaxis:   Bowel Regimen  Additional Recommendations (Limitations, Scope, Preferences):  Full Scope Treatment  Psycho-social/Spiritual:   Desire for further Chaplaincy support:yes  Additional Recommendations: Caregiving  Support/Resources  Prognosis:   Unable to determine  Discharge Planning: Home with Home Health      Primary Diagnoses: Present on Admission: . Dyspnea . Bladder cancer ([place]) . Diastolic CHF, chronic ([place]) . Essential hypertension . Elevated serum creatinine   I have reviewed the medical record, interviewed the patient and family, and examined the patient. The following aspects are pertinent.  Past Medical History:  Diagnosis Date  . Allergic rhinitis   . Bilateral carotid artery stenosis    per last duplex [id number]  RICA 16-01%,  LICA 0-93%  . Bladder cancer [facility]) urologist-  dr Alyson Ingles   Non-invasise High Grade  . Chronic cough    ? lisinopril intolerence  . Coronary artery disease cardiologiat-  dr Shelva Majestic   hx PCI and stenting 2003/  per cardiac cath total occlusion RCA w/ collaterals 1995  . Diverticulosis of colon   . History of adenomatous polyp of colon    tubular adenoma's 1992; 1993; 1997; 2007  . History of colonic polyps   . History of nonmelanoma skin cancer    BCC and SCC right side nose  s/p moh's reseciton and reconstruction 2013/  left ear  01/ 2017/   06/ 2017 in office excision 2 areas on scalp  . History of TIAs takes ASA   pt states has a few what he thinks ia tia's, states last time with symptoms of veritgo and thought process was 04/ 2017  . HTN (hypertension)   . Hypercholesterolemia   . Non-Hodgkin lymphoma, unspecified, unspecified site [facility]) oncologist-  dr Benay Spice--  pt in clinical remission  since 2000   dx 1996  no specific therapy since 2000  . OA (osteoarthritis)   . OSA (obstructive sleep apnea)    moderate per study 08-10-2013--  intolerant cpap  . Prostate nodule   . Thrombocytopenia ([facility])    chronic mild  . Wears glasses    Social History   Social History  . Marital status: Married    Spouse name: Statistician  . Number of children: 3  . Years of education: N/A   Occupational History  . retired-sales Chief Financial Officer    Social History Main Topics  . Smoking status: Former Smoker    Packs/day: 2.00    Years: 20.00    Types: Cigarettes    Quit date: 12/01/1966  . Smokeless tobacco: Never Used  . Alcohol use No  . Drug use: No  . Sexual activity: No   Other Topics Concern  . None  Social History Narrative   Married 61 years in 2015. 3 kids. 5 grandkids.       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 Pancreatic cancer Mother   . Heart disease Father    Scheduled Meds: . aspirin EC  162.5 mg Oral QPM  . atorvastatin  20 mg Oral q1800  . tamsulosin  0.4 mg Oral QPC supper  . verapamil  180 mg Oral QHS   Continuous Infusions: . ceFEPime (MAXIPIME) IV Stopped (08/03/17 2045)   PRN Meds:.acetaminophen **OR** acetaminophen, feeding supplement (ENSURE ENLIVE), ondansetron **OR** ondansetron (ZOFRAN) IV Medications Prior to Admission:  Prior to Admission medications   Medication Sig Start Date End Date Taking? Authorizing Provider  acetaminophen (TYLENOL) 500 MG tablet Take 250 mg by mouth 2 (two) times daily.   Yes [provider]  aspirin EC 325 MG tablet Take 162.5 mg by mouth every evening.    Yes [provider]  atorvastatin (LIPITOR) 20 MG tablet TAKE 1 TABLET (20  MG TOTAL) BY MOUTH DAILY. Patient taking differently: TAKE 1 TABLET (20 MG TOTAL) BY MOUTH DAILY AT NIGHT 04/02/17  Yes Troy Sine, MD  furosemide (LASIX) 40 MG tablet Take 1 tablet (40 mg total) by mouth daily. 07/30/17  Yes Lavina Hamman, MD  prochlorperazine (COMPAZINE) 10 MG tablet Take 1 tablet (10 mg total) by mouth every 6 (six) hours as needed for nausea or vomiting. 07/15/17  Yes Wyatt Portela, MD  tamsulosin (FLOMAX) 0.4 MG CAPS capsule Take 1 capsule (0.4 mg total) by mouth daily after supper. 07/20/17  Yes Hayden Pedro, PA-C  traMADol (ULTRAM) 50 MG tablet Take 1 tablet (50 mg total) by mouth every 4 (four) hours as needed. 06/29/17 06/29/18 Yes McKenzie, Candee Furbish, MD  verapamil (CALAN-SR) 180 MG CR tablet Take 1 tablet (180 mg total) by mouth at bedtime. 02/16/17  Yes Troy Sine, MD  atenolol (TENORMIN) 50 MG tablet Take 50 mg by mouth daily. 07/11/17   [provider]  Azelastine-Fluticasone 137-50 MCG/ACT SUSP Place 1 spray into the nose daily as needed. 01/23/17   [name], MD  feeding supplement, ENSURE ENLIVE, (ENSURE ENLIVE) LIQD Take 237 mLs by mouth 2 (two) times daily between meals. Patient not taking: Reported on 08/02/2017 07/29/17   Lavina Hamman, MD  hydrochlorothiazide (MICROZIDE) 12.5 MG capsule Take 12.5 mg by mouth daily. 07/01/17   [provider]  isosorbide mononitrate (IMDUR) 60 MG 24 hr tablet Take 90 mg by mouth daily. 07/23/17   [provider]   Allergies  Allergen Reactions  . Penicillins Other (See Comments)    Did not help with strep throat--Unknown reaction  Has patient had a PCN reaction causing immediate rash, facial/tongue/throat swelling, SOB or lightheadedness with hypotension: No Has patient had a PCN reaction causing severe rash involving mucus membranes or skin necrosis: No Has patient had a PCN reaction that required hospitalization: No Has patient had a PCN reaction occurring within the last 10  years: No If all of the above answers are "NO", then may proceed with Cephalosporin use.     Review of Systems +pain in abdomen.   Physical Exam Constitutional:  Appears well-developed, no distress  CVS: RRR, S1/S2 + Pulmonary: diminished breath sounds bilaterally, Abdominal: Soft. BS +,  no distension, tenderness, rebound or guarding.  Musculoskeletal: Normal range of motion. No tenderness.  Neuro: Alert. Normal reflexes, muscle tone coordination. No cranial nerve deficit. Skin: Skin is warm and dry.  Psychiatric: Normal mood and affect. Behavior, judgment, thought content normal.   Vital Signs: BP 109/72   Pulse 98   Temp 97.8 F (36.6 C) (Oral)   Resp 17   Ht _0  (1.778 m)   Wt 72.8 kg (160 lb 7.9 oz)   SpO2 98%   BMI 23.03 kg/m  Pain Assessment: No/denies pain   Pain Score: 0-No pain   SpO2: SpO2: 98 % O2 Device:SpO2: 98 % O2 Flow Rate: .O2 Flow Rate (L/min): 2 L/min  IO: Intake/output summary:  Intake/Output Summary (Last 24 hours) at 08/04/17 1529 Last data filed at 08/04/17 0500  Gross per 24 hour  Intake               50 ml  Output                0 ml  Net               50 ml    LBM: Last BM Date: 08/03/17 Baseline Weight: Weight: 73.5 kg (162 lb) Most recent weight: Weight: 72.8 kg (160 lb 7.9 oz)     Palliative Assessment/Data:   Flowsheet Rows     Most Recent Value  Intake Tab  Referral Department  Hospitalist  Unit at Time of Referral  Oncology Unit  Palliative Care Primary Diagnosis  Cancer  Palliative Care Type  New Palliative care  Reason for referral  Clarify Goals of Care, Counsel Regarding Hospice  Date first seen by Palliative Care  08/04/17  Clinical Assessment  Palliative Performance Scale Score  40%  Pain Max last 24 hours  4  Pain Min Last 24 hours  3  Dyspnea Max Last 24 Hours  3  Dyspnea Min Last 24 hours  2  Nausea Max Last 24 Hours  3  Nausea Min Last 24 Hours  2  Anxiety Max Last 24 Hours  3  Anxiety Min Last 24  Hours  2  Psychosocial & Spiritual Assessment  Palliative Care Outcomes  Patient/Family meeting held?  Yes  Who was at the meeting?  patient and daughter   Palliative Care Outcomes  Clarified goals of care     PPS 40% Time In:  8 Time Out:9   Time Total60:   Greater than 50%  of this time was spent counseling and coordinating care related to the above assessment and plan.  Signed by: Loistine Chance, MD  [phone].  Please contact Palliative Medicine Team phone at [phone] for questions and concerns.  For individual provider: See Shea Evans

## 2017-08-04 NOTE — Procedures (Signed)
Ultrasound-guided diagnostic and therapeutic left thoracentesis performed yielding 500 cc of turbid, yellow fluid. No immediate complications. Follow-up chest x-ray pending. A portion of the fluid was submitted to the laboratory for preordered studies.

## 2017-08-04 NOTE — Progress Notes (Signed)
Initial Nutrition Assessment  DOCUMENTATION CODES:   Not applicable  INTERVENTION:   Ensure Enlive po BID, each supplement provides 350 kcal and 20 grams of protein  Magic cup TID with meals, each supplement provides 290 kcal and 9 grams of protein  NUTRITION DIAGNOSIS:   Inadequate oral intake related to poor appetite as evidenced by per patient/family report.  GOAL:   Patient will meet greater than or equal to 90% of their needs  MONITOR:   PO intake, Supplement acceptance, Labs, Weight trends  REASON FOR ASSESSMENT:   Malnutrition Screening Tool    ASSESSMENT:   Pt with PMH of CAD s/p PCI 2003, diverticulosis, HTN, non-hodgkin lymphoma in remission since 200, and recent diagnosis of bladder cancer. Was recently hospitalized 07/25/2017 through 07/29/2017 for CHF and pneumonia. Presents this admission with sepsis and moderate left pleural effusion. US thoracentesis planned 9/3.   Spoke with pt and daughter at bedside. Pt eating 3-4 small portions per day related to feelings of fullness and loss in appetite. Typically pt eats oatmeal for breakfast and chicken with broccoli for dinner. Pt started drinking vanilla ensure with orange sherbet, but could not tolerate them for the past three weeks. Pt and daughter would like to try them this hospital stay after his schedule thoracentesis.   Records indicate pt has lost 11% of body wt in 4 months. This is significant though hard to tell actual dry wt loss vs fluid loss.  Nutrition-Focused physical exam completed. Findings are  No fat depletion, moderate muscle depletion in temple and clavicle region, and moderate edema. Pt's stomach shows to be distended with moderate edema in BLE. Will monitor wt trends post thoracentesis and provide supplementation as requested.   Medications reviewed and include: IV abx Labs reviewed: BUN 42 (H) Creatinine 1.29 (H) Albumin 3.0 (L) AST 75 (H)   Diet Order:  Diet Heart Room service appropriate? Yes;  Fluid consistency: Thin  Skin:  Reviewed, no issues  Last BM:  08/03/17  Height:   Ht Readings from Last 1 Encounters:  08/02/17 5\' 10"  (1.778 m)    Weight:   Wt Readings from Last 1 Encounters:  08/04/17 160 lb 7.9 oz (72.8 kg)    Ideal Body Weight:  75.5 kg  BMI:  Body mass index is 23.03 kg/m.  Estimated Nutritional Needs:   Kcal:  1900-2100 (26-29 kcal/kg)  Protein:  90-100 (1.2-1.4 g/kg)  Fluid:  1.9 L/day  EDUCATION NEEDS:   No education needs identified at this time  [place], LDN Clinical Nutrition Pager # - [phone]

## 2017-08-04 NOTE — Progress Notes (Signed)
PT Cancellation Note  Patient Details Name: Derrick Rivera MRN: [id number] DOB: 15-May-1924   Cancelled Treatment:    Reason Eval/Treat Not Completed: Patient at procedure or test/unavailable thoracentesis.  Will check back as schedule permits   Deland Slocumb,KATHrine E 08/04/2017, 2:47 PM Carmelia Bake, PT, DPT 08/04/2017 Pager: [phone]

## 2017-08-04 NOTE — Telephone Encounter (Signed)
I am aware. thanks

## 2017-08-05 ENCOUNTER — Encounter: Payer: Self-pay | Admitting: Radiation Oncology

## 2017-08-05 ENCOUNTER — Ambulatory Visit: Payer: Medicare HMO | Admitting: Physician Assistant

## 2017-08-05 DIAGNOSIS — Z9889 Other specified postprocedural states: Secondary | ICD-10-CM

## 2017-08-05 DIAGNOSIS — C679 Malignant neoplasm of bladder, unspecified: Secondary | ICD-10-CM

## 2017-08-05 DIAGNOSIS — Z7189 Other specified counseling: Secondary | ICD-10-CM

## 2017-08-05 DIAGNOSIS — R0602 Shortness of breath: Secondary | ICD-10-CM

## 2017-08-05 DIAGNOSIS — Z515 Encounter for palliative care: Secondary | ICD-10-CM

## 2017-08-05 DIAGNOSIS — C778 Secondary and unspecified malignant neoplasm of lymph nodes of multiple regions: Secondary | ICD-10-CM

## 2017-08-05 DIAGNOSIS — N179 Acute kidney failure, unspecified: Secondary | ICD-10-CM

## 2017-08-05 LAB — BASIC METABOLIC PANEL
ANION GAP: 13 (ref 5–15)
BUN: 47 mg/dL — ABNORMAL HIGH (ref 6–20)
CHLORIDE: 102 mmol/L (ref 101–111)
CO2: 24 mmol/L (ref 22–32)
Calcium: 9 mg/dL (ref 8.9–10.3)
Creatinine, Ser: 1.45 mg/dL — ABNORMAL HIGH (ref 0.61–1.24)
GFR calc non Af Amer: 41 mL/min — ABNORMAL LOW (ref >60)
GFR, EST AFRICAN AMERICAN: 47 mL/min — AB (ref >60)
Glucose, Bld: 88 mg/dL (ref 65–99)
POTASSIUM: 4.3 mmol/L (ref 3.5–5.1)
Sodium: 139 mmol/L (ref 135–145)

## 2017-08-05 LAB — CBC
HEMATOCRIT: 42.1 % (ref 39.0–52.0)
HEMOGLOBIN: 14.2 g/dL (ref 13.0–17.0)
MCH: 32.3 pg (ref 26.0–34.0)
MCHC: 33.7 g/dL (ref 30.0–36.0)
MCV: 95.7 fL (ref 78.0–100.0)
Platelets: 226 10*3/uL (ref 150–400)
RBC: 4.4 MIL/uL (ref 4.22–5.81)
RDW: 14.6 % (ref 11.5–15.5)
WBC: 12.2 10*3/uL — AB (ref 4.0–10.5)

## 2017-08-05 MED ORDER — ACETAMINOPHEN 325 MG PO TABS: 650.0000 mg | ORAL_TABLET | Freq: Four times a day (QID) | ORAL | Status: AC | PRN

## 2017-08-05 MED ORDER — MORPHINE SULFATE (CONCENTRATE) 10 MG /0.5 ML PO SOLN: 5.0000 mg | ORAL | 0 refills | Status: AC | PRN

## 2017-08-05 NOTE — Consult Note (Signed)
[facility] Received request from [place] for family interest in [facility]. Chart reviewed and paper work completed for transfer today. Dr. Alen Blew came by and visited with family at end of this visit. Dr. Orpah Melter to assume care per family preference.   Please send discharge summary to [phone].  RN please call report to [phone].  Thank you. Erling Conte, [place]

## 2017-08-05 NOTE — Progress Notes (Signed)
Patient going to [facility] residential hospice. PTAR contacted, family notified. Patient's RN can call report to [phone]. CSW faxed discharge summary to [facility]. CSW signing off, no other needs identified at this time.  Abundio Miu, [place] Social Worker [facility] Cell#: [phone]

## 2017-08-05 NOTE — Progress Notes (Signed)
Pt remains A&Ox4, condom catheter in place. Report called to Arbie Cookey at [facility] [phone]. Questions concerns denied.

## 2017-08-05 NOTE — Discharge Summary (Signed)
Physician Discharge Summary  Derrick Rivera UGQ:[id number] DOB: 1924/01/30 DOA: 08/02/2017  PCP: Derrick Olp, MD  Admit date: 08/02/2017 Discharge date: 08/05/2017  Time spent: 35 minutes  Recommendations for Outpatient Follow-up:  1. Discharge to [facility] for end of life care   Discharge Diagnoses:  Principal Problem:   AKi   Metastatic bladder CA   Essential hypertension   Bladder cancer (HCC)   Diastolic CHF, chronic (HCC)   Elevated serum creatinine   Encounter for palliative care   Goals of care, counseling/discussion   S/P thoracentesis   AKI (acute kidney injury) [name])   Discharge Condition: stable  Diet recommendation: comfort feeds  Filed Weights   08/03/17 0506 08/04/17 0523 08/05/17 0451  Weight: 73.4 kg (161 lb 13.1 oz) 72.8 kg (160 lb 7.9 oz) 74.8 kg (164 lb 14.5 oz)    History of present illness:  83 year old male with medical history significant for CAD status post PCI 2003, hypertension, heart failure with preserved ejection fraction, TIA, remote history of NHL in remission since 2000. Patient also has more recent diagnosis of bladder cancer in July of this year. He was supposed to start chemotherapy and radiation therapy this coming up week. He started to develop more weakness and shortness of breath as well as lack of appetite. He was recently hospitalized 07/25/2017 through 07/29/2017 for CHF and pneumonia  Hospital Course:   History of bladder cancer  - Now based on CT scan seems there is a progression of the disease with increasing and new adenopathy within the chest, abdomen and pelvis and new ill-defined soft tissue opacities within the abdomen, omentum, mesenteric and pelvis - highly suspicious for increasing malignancy/metastatic disease and/or lymphoma.  - Palliative consulted for goals of care and also seen by Dr.Shadad with Oncolgy - per Dr.Shadad he is very debilitated and with rapidly progressing malignancy based on recent scan not  appropriate for aggressive cancer treatment and recommended Hospice -also seen by Palliative care and decision made [facility]  Sepsis / Lactic acidosis / possible UTI - Sepsis criteria met on admission with hypothermia, tachycardia, tachypnea, leukocytosis, lactic acidosis - Source of infection suspected to be UTI - Urine cx showed no growth - Blood cx negative,  -no improvement with IV cefepime x3days -declined as noted above  Moderate left pleural effusion - s/p thoracentesis and 500cc drained -cytology pending, suspect malignant effusion  Acute kidney injury - Due to sepsis - Cr improved since admission with fluids   Hypokalemia - Supplemented   BPH - Continue Flomax   Consultations:  Oncology   Palliative care  Discharge Exam: Vitals:   08/04/17 2230 08/05/17 0451  BP: 119/70 109/72  Pulse: 97 86  Resp: 20 20  Temp: 97.7 F (36.5 C) 97.8 F (36.6 C)  SpO2: 98% 100%    General: AAOx3 Cardiovascular: S1S2/RRR Respiratory: CTAB  Discharge Instructions   Discharge Instructions    Discharge instructions    Complete by:  As directed    Comfort feeds, comfort Care   Increase activity slowly    Complete by:  As directed      Current Discharge Medication List    START taking these medications   Details  Morphine Sulfate (MORPHINE CONCENTRATE) 10 mg / 0.5 ml concentrated solution Take 0.25 mLs (5 mg total) by mouth every 4 (four) hours as needed for severe pain. Qty: 15 mL, Refills: 0      CONTINUE these medications which have CHANGED  Details  acetaminophen (TYLENOL) 325 MG tablet Take 2 tablets (650 mg total) by mouth every 6 (six) hours as needed for mild pain (or Fever >/= 101).      CONTINUE these medications which have NOT CHANGED   Details  prochlorperazine (COMPAZINE) 10 MG tablet Take 1 tablet (10 mg total) by mouth every 6 (six) hours as needed for nausea or vomiting. Qty: 30 tablet, Refills: 0     tamsulosin (FLOMAX) 0.4 MG CAPS capsule Take 1 capsule (0.4 mg total) by mouth daily after supper. Qty: 30 capsule, Refills: 2    atenolol (TENORMIN) 50 MG tablet Take 50 mg by mouth daily.      STOP taking these medications     aspirin EC 325 MG tablet      atorvastatin (LIPITOR) 20 MG tablet      furosemide (LASIX) 40 MG tablet      traMADol (ULTRAM) 50 MG tablet      verapamil (CALAN-SR) 180 MG CR tablet      Azelastine-Fluticasone 137-50 MCG/ACT SUSP      feeding supplement, ENSURE ENLIVE, (ENSURE ENLIVE) LIQD      hydrochlorothiazide (MICROZIDE) 12.5 MG capsule      isosorbide mononitrate (IMDUR) 60 MG 24 hr tablet        Allergies  Allergen Reactions  . Penicillins Other (See Comments)    Did not help with strep throat--Unknown reaction  Has patient had a PCN reaction causing immediate rash, facial/tongue/throat swelling, SOB or lightheadedness with hypotension: No Has patient had a PCN reaction causing severe rash involving mucus membranes or skin necrosis: No Has patient had a PCN reaction that required hospitalization: No Has patient had a PCN reaction occurring within the last 10 years: No If all of the above answers are "NO", then may proceed with Cephalosporin use.        The results of significant diagnostics from this hospitalization (including imaging, microbiology, ancillary and laboratory) are listed below for reference.    Significant Diagnostic Studies: Dg Chest 1 View  Result Date: 08/04/2017 CLINICAL DATA:  Status post left-sided thoracentesis EXAM: CHEST 1 VIEW COMPARISON:  Chest radiograph July 27, 2017 and chest CT August 02, 2017 FINDINGS: No pneumothorax. Left pleural effusion much smaller following thoracentesis. There is also a small right pleural effusion. There is mild bibasilar atelectasis. There is no edema or consolidation. Heart size is normal. Pulmonary vascular is normal. There is aortic atherosclerosis. No adenopathy. There is  a total shoulder replacement on the left. There is advanced osteoarthritis in the right shoulder with remodeling along the proximal right humerus. IMPRESSION: No evident pneumothorax. There are small pleural effusions bilaterally with bibasilar atelectasis. There is aortic atherosclerosis. Heart size normal. Advanced arthropathy right shoulder. Total shoulder replacement on the left. Aortic Atherosclerosis (ICD10-I70.0). Electronically Signed   By: Lowella Grip III M.D.   On: 08/04/2017 15:26   Dg Chest 2 View  Result Date: 07/27/2017 CLINICAL DATA:  Exertional shortness of breath. History of non-Hodgkin's lymphoma, hypertension, coronary artery disease with stent placement, former smoker. EXAM: CHEST  2 VIEW COMPARISON:  Chest x-ray of July 25, 2017 and chest x-ray of December 03, 2016. FINDINGS: The lungs are borderline hypoinflated. There is a small left pleural effusion. There is increased density in the retrocardiac region on the left. There is a trace of pleural fluid on the right subtle increased interstitial density in the right upper lobe persists but is slightly less conspicuous. The heart and pulmonary vascularity are normal. There  is tortuosity of the ascending thoracic aorta. There is calcification in the wall of the aortic arch. 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 There is severe degenerative change of the right shoulder. There is multilevel degenerative disc disease of the thoracic spine. IMPRESSION: Small bilateral pleural effusions. Left basilar atelectasis or pneumonia. Slight interval improvement in interstitial infiltrate in the right upper lobe. Thoracic aortic atherosclerosis. Electronically Signed   By: David  Martinique M.D.   On: 07/27/2017 08:36   Dg Chest 2 View  Result Date: 07/25/2017 CLINICAL DATA:  Shortness of breath for 2 weeks. EXAM: CHEST  2 VIEW COMPARISON:  December 20, 2013 and December 03, 2016 FINDINGS: No pneumothorax. Increased haziness in the medial right upper  lobe was not seen previously. There are small effusions with underlying atelectasis. The cardiomediastinal silhouette is stable with a tortuous thoracic aorta. No other acute abnormalities. IMPRESSION: 1. Small bilateral effusions, left greater than right, with underlying atelectasis. 2. Mild haziness in the right upper lobe is nonspecific but an early infiltrate is not excluded. Recommend follow-up to resolution. Electronically Signed   By: Dorise Bullion III M.D   On: 07/25/2017 09:30   Ct Angio Chest Pe W/cm &/or Wo Cm  Result Date: 08/02/2017 CLINICAL DATA:  83 year old male with acute shortness of breath, abdominal and pelvic distention and unexpected weight loss. History of bladder cancer undergoing BCG treatment. History of non-Hodgkin's lymphoma. EXAM: CT ANGIOGRAPHY CHEST CT ABDOMEN AND PELVIS WITH CONTRAST TECHNIQUE: Multidetector CT imaging of the chest was performed using the standard protocol during bolus administration of intravenous contrast. Multiplanar CT image reconstructions and MIPs were obtained to evaluate the vascular anatomy. Multidetector CT imaging of the abdomen and pelvis was performed using the standard protocol during bolus administration of intravenous contrast. CONTRAST:  80 cc intravenous Isovue 370 COMPARISON:  06/25/2017 abdominal and pelvic CT, 12/21/2008 chest CT and other studies. FINDINGS: CTA CHEST FINDINGS Cardiovascular: This is a technically satisfactory study. No pulmonary emboli are identified. Cardiomegaly and heavy coronary artery calcifications again noted. Diffuse aneurysm of the ascending aorta again noted measuring 4 cm in greatest diameter. Aortic atherosclerotic calcifications are again noted. A small pericardial effusion is now identified. Mediastinum/Nodes: New mildly enlarged enlarged mediastinal and bilateral hilar lymph nodes are identified as follows (series 5): A 1.2 cm AP window node (image 28) A 1.6 x 2.5 cm subcarinal node (image 37) a 1.5 x 2.5 cm  lower left hilar node (image 42) Other new much smaller lymph nodes within the mediastinum are present. Lungs/Pleura: A moderate to large left pleural effusion and small right pleural effusion are identified. Bilateral lower lobe atelectasis identified.No definite pulmonary mass is noted. There is no evidence of pneumothorax. Musculoskeletal: No acute or suspicious abnormality. Left shoulder arthroplasty and degenerative changes in the right shoulder are noted. Review of the MIP images confirms the above findings. CT ABDOMEN and PELVIS FINDINGS Hepatobiliary: No acute or focal hepatic abnormality. Cholelithiasis identified. No biliary dilatation. Pancreas: Pancreatic atrophy without other significant abnormality Spleen: Unremarkable Adrenals/Urinary Tract: Bilateral ureteral stents are now identified with tips in the renal pelvis and bladder. Severe right and moderate left hydronephrosis again noted and not significantly changed. Bilateral renal cortical atrophy again identified. The adrenal glands are unremarkable. Diffuse bladder wall thickening is again noted. Stomach/Bowel: Irregular wall thickening of the proximal sigmoid colon appears increased since the prior study. There is no evidence of bowel obstruction, pneumoperitoneum or focal abscess. Vascular/Lymphatic: Aortic atherosclerosis. New and enlarging adenopathy within the retroperitoneum, abdomen and  mesenteric noted as follows (series 6): A 2.1 cm left retroperitoneal node (image 33), previously 1.1 cm. A 2 cm left retroperitoneal node (image 41), previously 1.4 cm. Reproductive: Prostate unchanged Other: Increasing diffuse ill-defined soft tissue within the pelvic fat, omentum and mesenteric noted. A small amount of ascites adjacent to the liver now noted. Heavy calcification within the mesenteric again identified. Musculoskeletal: No acute abnormalities. Review of the MIP images confirms the above findings. IMPRESSION: 1. Increasing and new adenopathy  within the chest, abdomen and pelvis and new ill-defined soft tissue opacities within the abdomen, omentum, mesenteric and pelvis - highly suspicious for increasing malignancy/metastatic disease and/or lymphoma. Diffuse bladder wall thickening again noted. 2. Moderate left pleural effusion, small right pleural effusion, small pericardial effusion and small amount of ascites. 3. Increasing diffuse wall thickening of the proximal sigmoid colon -favor metastatic disease over primary malignancy. 4. No evidence of pulmonary emboli. 5. Unchanged 4 cm ascending thoracic aortic aneurysm 6. Bilateral urinary stents with unchanged severe right and moderate left hydronephrosis. 7. Cardiomegaly and coronary artery disease. Electronically Signed   By: Margarette Canada M.D.   On: 08/02/2017 18:17   Ct Abdomen Pelvis W Contrast  Result Date: 08/02/2017 CLINICAL DATA:  84 year old male with acute shortness of breath, abdominal and pelvic distention and unexpected weight loss. History of bladder cancer undergoing BCG treatment. History of non-Hodgkin's lymphoma. EXAM: CT ANGIOGRAPHY CHEST CT ABDOMEN AND PELVIS WITH CONTRAST TECHNIQUE: Multidetector CT imaging of the chest was performed using the standard protocol during bolus administration of intravenous contrast. Multiplanar CT image reconstructions and MIPs were obtained to evaluate the vascular anatomy. Multidetector CT imaging of the abdomen and pelvis was performed using the standard protocol during bolus administration of intravenous contrast. CONTRAST:  80 cc intravenous Isovue 370 COMPARISON:  06/25/2017 abdominal and pelvic CT, 12/21/2008 chest CT and other studies. FINDINGS: CTA CHEST FINDINGS Cardiovascular: This is a technically satisfactory study. No pulmonary emboli are identified. Cardiomegaly and heavy coronary artery calcifications again noted. Diffuse aneurysm of the ascending aorta again noted measuring 4 cm in greatest diameter. Aortic atherosclerotic  calcifications are again noted. A small pericardial effusion is now identified. Mediastinum/Nodes: New mildly enlarged enlarged mediastinal and bilateral hilar lymph nodes are identified as follows (series 5): A 1.2 cm AP window node (image 28) A 1.6 x 2.5 cm subcarinal node (image 37) a 1.5 x 2.5 cm lower left hilar node (image 42) Other new much smaller lymph nodes within the mediastinum are present. Lungs/Pleura: A moderate to large left pleural effusion and small right pleural effusion are identified. Bilateral lower lobe atelectasis identified.No definite pulmonary mass is noted. There is no evidence of pneumothorax. Musculoskeletal: No acute or suspicious abnormality. Left shoulder arthroplasty and degenerative changes in the right shoulder are noted. Review of the MIP images confirms the above findings. CT ABDOMEN and PELVIS FINDINGS Hepatobiliary: No acute or focal hepatic abnormality. Cholelithiasis identified. No biliary dilatation. Pancreas: Pancreatic atrophy without other significant abnormality Spleen: Unremarkable Adrenals/Urinary Tract: Bilateral ureteral stents are now identified with tips in the renal pelvis and bladder. Severe right and moderate left hydronephrosis again noted and not significantly changed. Bilateral renal cortical atrophy again identified. The adrenal glands are unremarkable. Diffuse bladder wall thickening is again noted. Stomach/Bowel: Irregular wall thickening of the proximal sigmoid colon appears increased since the prior study. There is no evidence of bowel obstruction, pneumoperitoneum or focal abscess. Vascular/Lymphatic: Aortic atherosclerosis. New and enlarging adenopathy within the retroperitoneum, abdomen and mesenteric noted as follows (series 6):  A 2.1 cm left retroperitoneal node (image 33), previously 1.1 cm. A 2 cm left retroperitoneal node (image 41), previously 1.4 cm. Reproductive: Prostate unchanged Other: Increasing diffuse ill-defined soft tissue within the  pelvic fat, omentum and mesenteric noted. A small amount of ascites adjacent to the liver now noted. Heavy calcification within the mesenteric again identified. Musculoskeletal: No acute abnormalities. Review of the MIP images confirms the above findings. IMPRESSION: 1. Increasing and new adenopathy within the chest, abdomen and pelvis and new ill-defined soft tissue opacities within the abdomen, omentum, mesenteric and pelvis - highly suspicious for increasing malignancy/metastatic disease and/or lymphoma. Diffuse bladder wall thickening again noted. 2. Moderate left pleural effusion, small right pleural effusion, small pericardial effusion and small amount of ascites. 3. Increasing diffuse wall thickening of the proximal sigmoid colon -favor metastatic disease over primary malignancy. 4. No evidence of pulmonary emboli. 5. Unchanged 4 cm ascending thoracic aortic aneurysm 6. Bilateral urinary stents with unchanged severe right and moderate left hydronephrosis. 7. Cardiomegaly and coronary artery disease. Electronically Signed   By: Margarette Canada M.D.   On: 08/02/2017 18:17   US Thoracentesis Asp Pleural Space W/img Guide  Result Date: 08/04/2017 INDICATION: Patient with remote history of non-Hodgkin's lymphoma; now with bladder cancer, CHF, pneumonia, dyspnea, left pleural effusion. Request made for diagnostic and therapeutic left thoracentesis. EXAM: ULTRASOUND GUIDED DIAGNOSTIC AND THERAPEUTIC LEFT THORACENTESIS MEDICATIONS: None. COMPLICATIONS: None immediate. PROCEDURE: An ultrasound guided thoracentesis was thoroughly discussed with the patient and questions answered. The benefits, risks, alternatives and complications were also discussed. The patient understands and wishes to proceed with the procedure. Written consent was obtained. Ultrasound was performed to localize and mark an adequate pocket of fluid in the left chest. The area was then prepped and draped in the normal sterile fashion. 1% Lidocaine was  used for local anesthesia. Under ultrasound guidance a Safe-T-Centesis catheter was introduced. Thoracentesis was performed. The catheter was removed and a dressing applied. FINDINGS: A total of approximately 500 cc of turbid, yellow fluid was removed. Samples were sent to the laboratory as requested by the clinical team. IMPRESSION: Successful ultrasound guided diagnostic and therapeutic left thoracentesis yielding 500 cc of pleural fluid. Read by: Rowe Robert, PA-C Electronically Signed   By: Sandi Mariscal M.D.   On: 08/04/2017 15:20    Microbiology: Recent Results (from the past 240 hour(s))  Culture, blood (x 2)     Status: None (Preliminary result)   Collection Time: 08/02/17  4:16 PM  Result Value Ref Range Status   Specimen Description BLOOD LEFT ANTECUBITAL  Final   Special Requests   Final    BOTTLES DRAWN AEROBIC AND ANAEROBIC Blood Culture adequate volume   Culture   Final    NO GROWTH 2 DAYS Performed at [facility], [address] [place],  [address]    Report Status PENDING  Incomplete  Urine culture     Status: None   Collection Time: 08/02/17  7:50 PM  Result Value Ref Range Status   Specimen Description URINE, RANDOM  Final   Special Requests NONE  Final   Culture   Final    NO GROWTH Performed at [facility], [address], [place],  [address]    Report Status 08/04/2017 FINAL  Final  Culture, blood (x 2)     Status: None (Preliminary result)   Collection Time: 08/02/17 10:58 PM  Result Value Ref Range Status   Specimen Description BLOOD LEFT FOREARM  Final  Special Requests   Final    BOTTLES DRAWN AEROBIC AND ANAEROBIC Blood Culture adequate volume   Culture   Final    NO GROWTH 2 DAYS Performed at [facility], [place] [address], [place], [place] [address]    Report Status PENDING  Incomplete  Gram stain     Status: None   Collection Time: 08/04/17  3:04 PM  Result Value Ref Range Status   Specimen Description FLUID PLEURAL   Final   Special Requests NONE  Final   Gram Stain   Final    ABUNDANT WBC PRESENT, PREDOMINANTLY MONONUCLEAR NO ORGANISMS SEEN Performed at [facility], [place] [address], [place], [place] [address]    Report Status 08/04/2017 FINAL  Final     Labs: Basic Metabolic Panel:  Recent Labs Lab 08/02/17 1610 08/03/17 0444 08/04/17 0425 08/05/17 0524  NA 141 142 140 139  K 3.8 3.4* 4.2 4.3  CL 96* 102 103 102  CO2 _0 GLUCOSE 93 63* 80 88  BUN 46* 44* 42* 47*  CREATININE 1.48* 1.27* 1.29* 1.45*  CALCIUM 9.9 8.8* 9.0 9.0   Liver Function Tests:  Recent Labs Lab 08/02/17 1610  AST 75*  ALT 29  ALKPHOS 63  BILITOT 1.0  PROT 5.9*  ALBUMIN 3.0*    Recent Labs Lab 08/02/17 1610  LIPASE 24   No results for input(s): AMMONIA in the last 168 hours. CBC:  Recent Labs Lab 08/02/17 1610 08/03/17 0444 08/04/17 0425 08/05/17 0524  WBC 10.6* 10.3 12.5* 12.2*  NEUTROABS 8.6*  --   --   --   HGB 14.3 13.1 14.2 14.2  HCT 41.1 39.0 42.2 42.1  MCV 94.1 94.0 94.2 95.7  PLT 266 213 237 226   Cardiac Enzymes: No results for input(s): CKTOTAL, CKMB, CKMBINDEX, TROPONINI in the last 168 hours. BNP: BNP (last 3 results)  Recent Labs  07/25/17 0924  BNP 140.5*    ProBNP (last 3 results) No results for input(s): PROBNP in the last 8760 hours.  CBG: No results for input(s): GLUCAP in the last 168 hours.     SignedDomenic Polite MD.  [facility] 08/05/2017, 12:51 PM

## 2017-08-05 NOTE — Progress Notes (Signed)
Daily Progress Note   Patient Name: Derrick Rivera       Date: 08/05/2017 DOB: December 22, 1923  Age: 83 y.o. MRN#: [id number] Attending Physician: Domenic Polite, MD Primary Care Physician: [name], MD Admit Date: 08/02/2017  Reason for Consultation/Follow-up: Establishing goals of care  Subjective:  appears much more weak, appears short of breath Wife and daughter at bedside See below:   Length of Stay: 3  Current Medications: Scheduled Meds:  . aspirin EC  162.5 mg Oral QPM  . atorvastatin  20 mg Oral q1800  . tamsulosin  0.4 mg Oral QPC supper  . verapamil  180 mg Oral QHS    Continuous Infusions: . ceFEPime (MAXIPIME) IV Stopped (08/04/17 2144)    PRN Meds: acetaminophen **OR** acetaminophen, feeding supplement (ENSURE ENLIVE), ondansetron **OR** ondansetron (ZOFRAN) IV  Physical Exam         Frail weak elderly gentleman Diminished breath sounds Abdomen distended and some what firm S1 S2 Trace edema Awake alert non focal  Vital Signs: BP 109/72 (BP Location: Right Arm)   Pulse 86   Temp 97.8 F (36.6 C) (Axillary)   Resp 20   Ht _0  (1.778 m)   Wt 74.8 kg (164 lb 14.5 oz)   SpO2 100%   BMI 23.66 kg/m  SpO2: SpO2: 100 % O2 Device: O2 Device: Nasal Cannula O2 Flow Rate: O2 Flow Rate (L/min): 2 L/min  Intake/output summary:  Intake/Output Summary (Last 24 hours) at 08/05/17 1020 Last data filed at 08/05/17 1008  Gross per 24 hour  Intake              580 ml  Output              200 ml  Net              380 ml   LBM: Last BM Date: 08/03/17 Baseline Weight: Weight: 73.5 kg (162 lb) Most recent weight: Weight: 74.8 kg (164 lb 14.5 oz)       Palliative Assessment/Data: PPS 30%   Flowsheet Rows     Most Recent Value  Intake Tab  Referral  Department  Hospitalist  Unit at Time of Referral  Oncology Unit  Palliative Care Primary Diagnosis  Cancer  Palliative Care Type  New Palliative care  Reason for referral  Clarify Goals of Care, Counsel Regarding Hospice  Date first seen by Palliative Care  08/04/17  Clinical Assessment  Palliative Performance Scale Score  40%  Pain Max last 24 hours  4  Pain Min Last 24 hours  3  Dyspnea Max Last 24 Hours  3  Dyspnea Min Last 24 hours  2  Nausea Max Last 24 Hours  3  Nausea Min Last 24 Hours  2  Anxiety Max Last 24 Hours  3  Anxiety Min Last 24 Hours  2  Psychosocial & Spiritual Assessment  Palliative Care Outcomes  Patient/Family meeting held?  Yes  Who was at the meeting?  patient and daughter   Palliative Care Outcomes  Clarified goals of care      Patient Active Problem List   Diagnosis Date Noted  . Encounter for palliative care   . Goals of care, counseling/discussion   . Dyspnea 08/02/2017  . Elevated serum creatinine 08/02/2017  . SOB (shortness of breath) 07/31/2017  . Diastolic CHF, chronic ([place])   . Pneumonia 07/25/2017  . Bladder cancer ([place]) 06/29/2017  . Prostate nodule 08/10/2015  . Obstructive sleep apnea hypopnea, moderate 09/16/2014  . Hyperlipidemia LDL goal <70 09/16/2014  . Basal cell carcinoma 07/31/2014  . Laceration 06/13/2014  . Herpes zoster 03/30/2012  . NON-HODGKIN'S LYMPHOMA 07/09/2008  . THROMBOCYTOPENIA 07/09/2008  . Allergic rhinitis 06/27/2008  . COLONIC POLYPS 06/26/2008  . Essential hypertension 06/26/2008  . Coronary artery disease 06/26/2008  . Chronic venous insufficiency 06/26/2008  . GERD 06/26/2008  . DEGENERATIVE JOINT DISEASE 06/26/2008    Palliative Care Assessment & Plan   Patient Profile:    Assessment:  shortness of breath Recent pleural effusion requiring thoracentesis, 500 ml fluid removed Ongoing debility and functional status decline Rapidly progressive bladder cancer  Recommendations/Plan:   Agree  with Dr Hazeline Junker input and recommendations, I met with patient,wife and daughter this am, we reviewed Dr Hazeline Junker input and recommendations, also agree with DNR DNI that has been established by Dr Broadus John [facility] MD.   We discussed about hospice philosophy of care in detail, home with hospice versus residential hospice discussed in detail, patient in my opinion is eligible for residential hospice, we discussed about hospice choices, patient and family are familiar with [facility], [place] consult has been placed to help facilitate transfer to residential hospice Goals are for full scope of comfort measures at this time, avoid suffering at end of life. I answered the patient's wife and daughter's questions to the best of my ability. Offered active listening and supportive care.   Goals of Care and Additional Recommendations:  Limitations on Scope of Treatment: Full Comfort Care  Code Status:    Code Status Orders        Start     Ordered   08/05/17 0908  Do not attempt resuscitation (DNR)  Continuous    Question Answer Comment  In the event of cardiac or respiratory ARREST Do not call a "code blue"   In the event of cardiac or respiratory ARREST Do not perform Intubation, CPR, defibrillation or ACLS   In the event of cardiac or respiratory ARREST Use medication by any route, position, wound care, and other measures to relive pain and suffering. May use oxygen, suction and manual treatment of airway obstruction as needed for comfort.      08/05/17 7829    Code Status History    Date Active Date Inactive Code Status Order ID Comments User Context   08/02/2017 11:58  PM 08/05/2017  9:07 AM Full Code [id number]  Edwin Dada, MD Inpatient   07/25/2017  2:50 PM 07/29/2017  6:15 PM Full Code [id number]  Georgette Shell, MD ED   06/29/2017  1:24 PM 06/29/2017 10:43 PM Full Code [id number]  McKenzie, Candee Furbish, MD Inpatient       Prognosis:   < 2 weeks  Discharge Planning:  Hospice  facility  Care plan was discussed with  Patient, wife, daughter  Thank you for allowing the Palliative Medicine Team to assist in the care of this patient.   Time In: 9.30 Time Out: 10.05 Total Time 35 Prolonged Time Billed  no       Greater than 50%  of this time was spent counseling and coordinating care related to the above assessment and plan.  Loistine Chance, MD [phone]  Please contact Palliative Medicine Team phone at [phone] for questions and concerns.

## 2017-08-05 NOTE — Progress Notes (Signed)
IP PROGRESS NOTE  Subjective:   This is a pleasant 83 year old gentleman I saw in consultation on 07/15/2017. He had localize bladder tumor and was under evaluation to start definitive therapy with radiation and chemotherapy. Since that time, he was hospitalized twice and recent CT scan on 08/02/2017 showed widespread of his disease.  Clinically he is quite debilitated and currently bed bound. He continues to have symptoms of dyspnea on exertion and hypoxia. He had a pleural effusion that was drained on 08/04/2017 with 500 mL of fluid was noted. He does not report any pain or discomfort although appears quite dyspneic.  Objective:  Vital signs in last 24 hours: Temp:  [97.7 F (36.5 C)-97.8 F (36.6 C)] 97.8 F (36.6 C) (09/05 0451) Pulse Rate:  [86-99] 86 (09/05 0451) Resp:  [19-20] 20 (09/05 0451) BP: (96-119)/(70-83) 109/72 (09/05 0451) SpO2:  [98 %-100 %] 100 % (09/05 0451) Weight:  [164 lb 14.5 oz (74.8 kg)] 164 lb 14.5 oz (74.8 kg) (09/05 0451) Weight change: 4 lb 6.6 oz (2 kg) Last BM Date: 08/03/17  Intake/Output from previous day: 09/04 0701 - 09/05 0700 In: 82 [P.O.:410; IV Piggyback:50] Out: 200 [Urine:200] Ill-appearing gentleman appeared without distress. Mouth: mucous membranes moist, pharynx normal without lesions Resp: Scattered rhonchi and wheezes bilaterally. Decreased breath sounds in the left. Cardio: regular rate and rhythm, S1, S2 normal, no murmur, click, rub or gallop GI: soft, non-tender; bowel sounds normal; no masses,  no organomegaly Extremities:   Portacath/PICC-without erythema  Lab Results:  Recent Labs  08/04/17 0425 08/05/17 0524  WBC 12.5* 12.2*  HGB 14.2 14.2  HCT 42.2 42.1  PLT 237 226    BMET  Recent Labs  08/04/17 0425 08/05/17 0524  NA 140 139  K 4.2 4.3  CL 103 102  CO2 23 24  GLUCOSE 80 88  BUN 42* 47*  CREATININE 1.29* 1.45*  CALCIUM 9.0 9.0    EXAM: CT ANGIOGRAPHY CHEST  CT ABDOMEN AND PELVIS WITH  CONTRAST  TECHNIQUE: Multidetector CT imaging of the chest was performed using the standard protocol during bolus administration of intravenous contrast. Multiplanar CT image reconstructions and MIPs were obtained to evaluate the vascular anatomy. Multidetector CT imaging of the abdomen and pelvis was performed using the standard protocol during bolus administration of intravenous contrast.  CONTRAST:  80 cc intravenous Isovue 370  COMPARISON:  06/25/2017 abdominal and pelvic CT, 12/21/2008 chest CT and other studies.  FINDINGS: CTA CHEST FINDINGS  Cardiovascular: This is a technically satisfactory study. No pulmonary emboli are identified. Cardiomegaly and heavy coronary artery calcifications again noted. Diffuse aneurysm of the ascending aorta again noted measuring 4 cm in greatest diameter. Aortic atherosclerotic calcifications are again noted. A small pericardial effusion is now identified.  Mediastinum/Nodes: New mildly enlarged enlarged mediastinal and bilateral hilar lymph nodes are identified as follows (series 5):  A 1.2 cm AP window node (image 28)  A 1.6 x 2.5 cm subcarinal node (image 37) a 1.5 x 2.5 cm lower left hilar node (image 42)  Other new much smaller lymph nodes within the mediastinum are present.  Lungs/Pleura: A moderate to large left pleural effusion and small right pleural effusion are identified. Bilateral lower lobe atelectasis identified.No definite pulmonary mass is noted. There is no evidence of pneumothorax.  Musculoskeletal: No acute or suspicious abnormality. Left shoulder arthroplasty and degenerative changes in the right shoulder are noted.  Review of the MIP images confirms the above findings.  CT ABDOMEN and PELVIS FINDINGS  Hepatobiliary: No  acute or focal hepatic abnormality. Cholelithiasis identified. No biliary dilatation.  Pancreas: Pancreatic atrophy without other significant abnormality  Spleen:  Unremarkable  Adrenals/Urinary Tract: Bilateral ureteral stents are now identified with tips in the renal pelvis and bladder. Severe right and moderate left hydronephrosis again noted and not significantly changed. Bilateral renal cortical atrophy again identified. The adrenal glands are unremarkable.  Diffuse bladder wall thickening is again noted.  Stomach/Bowel: Irregular wall thickening of the proximal sigmoid colon appears increased since the prior study. There is no evidence of bowel obstruction, pneumoperitoneum or focal abscess.  Vascular/Lymphatic: Aortic atherosclerosis. New and enlarging adenopathy within the retroperitoneum, abdomen and mesenteric noted as follows (series 6):  A 2.1 cm left retroperitoneal node (image 33), previously 1.1 cm.  A 2 cm left retroperitoneal node (image 41), previously 1.4 cm.  Reproductive: Prostate unchanged  Other: Increasing diffuse ill-defined soft tissue within the pelvic fat, omentum and mesenteric noted. A small amount of ascites adjacent to the liver now noted. Heavy calcification within the mesenteric again identified.  Musculoskeletal: No acute abnormalities.  Review of the MIP images confirms the above findings.  IMPRESSION: 1. Increasing and new adenopathy within the chest, abdomen and pelvis and new ill-defined soft tissue opacities within the abdomen, omentum, mesenteric and pelvis - highly suspicious for increasing malignancy/metastatic disease and/or lymphoma. Diffuse bladder wall thickening again noted. 2. Moderate left pleural effusion, small right pleural effusion, small pericardial effusion and small amount of ascites. 3. Increasing diffuse wall thickening of the proximal sigmoid colon -favor metastatic disease over primary malignancy. 4. No evidence of pulmonary emboli. 5. Unchanged 4 cm ascending thoracic aortic aneurysm 6. Bilateral urinary stents with unchanged severe right and moderate left  hydronephrosis. 7. Cardiomegaly and coronary artery disease.     Medications: I have reviewed the patient's current medications.  Assessment/Plan:  83 year old gentleman with the following issues:  1. Localized bladder cancer diagnosed in July 2018 and currently developed metastatic disease with increased adenopathy in the chest abdomen and pelvis as well as omental and mesenteric involvement of his disease.  The natural course of this disease was reviewed today with the patient. His family is not present in the room but I will discuss that with them separately. He appears to have incurable malignancy that is progressing rapidly. He does not have localized disease anymore.  Given the rapid changes in his malignancy as well as the quite debilitated state he is again, he is no longer candidate for aggressive therapy. I believe palliative chemotherapy will offer very little to alleviate the cancer burden and will create more complications than benefit.  Given these findings I recommended hospice enrollment in the immediate future. He appears to be quite debilitated and might require residential hospice.   2. Prognosis: Very poor with very limited life expectancy probably in few weeks rather than months. I recommended changing his CODE STATUS to DO NOT RESUSCITATE and consider immediate hospice enrollment.  I will return later on today to discuss these findings with his wife and daughters were not present at bedside. He is fully awake and understands everything I told him today I answered all his questions as well.   LOS: 3 days   Zola Button 08/05/2017, 7:46 AM

## 2017-08-05 NOTE — Care Management Note (Signed)
Case Management Note  Patient Details  Name: Derrick Rivera MRN: [id number] Date of Birth: 11-10-1924  Subjective/Objective:  Palliative-residential hospice.CSW managing d/c residential hospice.                  Action/Plan:d/c residential hospice facility.   Expected Discharge Date:  08/05/17               Expected Discharge Plan:  [place]  In-House Referral:  Clinical Social Work  Discharge planning Services  CM Consult  Post Acute Care Choice:    Choice offered to:     DME Arranged:    DME Agency:     HH Arranged:    [place] Agency:     Status of Service:  Completed, signed off  If discussed at [employer], dates discussed:    Additional Comments:  Dessa Phi, RN 08/05/2017, 1:04 PM

## 2017-08-05 NOTE — Progress Notes (Signed)
Pharmacy Antibiotic Note  Derrick Rivera is a 83 y.o. male with SOB related to abdominal/torso fluid retention admitted on 08/02/2017 with UTI.  Pharmacy has been consulted for cefepime dosing.  Today, 08/05/2017 Day #4 Cefepime  Afebrile WBC remain elevated SCr increased 1.45, CrCl 35 ml/min Cultures negative to date  Plan: Continue Cefepime 2 Gm IV q24h F/u scr/cultures  Height: 5\' 10"  (177.8 cm) Weight: 164 lb 14.5 oz (74.8 kg) IBW/kg (Calculated) : 73  Temp (24hrs), Avg:97.7 F (36.5 C), Min:97.7 F (36.5 C), Max:97.8 F (36.6 C)   Recent Labs Lab 08/02/17 1610 08/02/17 1622 08/02/17 1925 08/02/17 2229 08/03/17 0033 08/03/17 0444 08/04/17 0425 08/05/17 0524  WBC 10.6*  --   --   --   --  10.3 12.5* 12.2*  CREATININE 1.48*  --   --   --   --  1.27* 1.29* 1.45*  LATICACIDVEN  --  5.65* 3.90* 3.5* 3.6* 3.0*  --   --     Estimated Creatinine Clearance: 35 mL/min (A) (by C-G formula based on SCr of 1.45 mg/dL (H)).    Allergies  Allergen Reactions  . Penicillins Other (See Comments)    Did not help with strep throat--Unknown reaction  Has patient had a PCN reaction causing immediate rash, facial/tongue/throat swelling, SOB or lightheadedness with hypotension: No Has patient had a PCN reaction causing severe rash involving mucus membranes or skin necrosis: No Has patient had a PCN reaction that required hospitalization: No Has patient had a PCN reaction occurring within the last 10 years: No If all of the above answers are "NO", then may proceed with Cephalosporin use.      Antimicrobials this admission: 9/2 cefepime >>   Dose adjustments this admission:  Microbiology results: 9/2 BCx: ngtd 9/2 UCx: sent 9/4 Pleural fluid: no organisms seen  Thank you for allowing pharmacy to be a part of this patient's care.  Peggyann Juba, PharmD, BCPS Pager: [phone] 08/05/2017 7:31 AM

## 2017-08-05 NOTE — Progress Notes (Signed)
Resumed care of patient. Agree with previous RN's assessment. Will continue to follow plan of care for patient.

## 2017-08-05 NOTE — Clinical Social Work Note (Signed)
Clinical Social Work Assessment  Patient Details  Name: Derrick Rivera MRN: [id number] Date of Birth: 19-Dec-1923  Date of referral:  08/05/17               Reason for consult:  End of Life/Hospice                Permission sought to share information with:  Chartered certified accountant granted to share information::  Yes, Verbal Permission Granted  Name::        Agency::     Relationship::     Contact Information:     Housing/Transportation Living arrangements for the past 2 months:  Single Family Home Source of Information:  Patient, Adult Children, Spouse Patient Interpreter Needed:  None Criminal Activity/Legal Involvement Pertinent to Current Situation/Hospitalization:  No - Comment as needed Significant Relationships:  Adult Children, Spouse Lives with:  Spouse Do you feel safe going back to the place where you live?   (PAtient interested in residential hospice) Need for family participation in patient care:  Yes (Comment)  Care giving concerns:  Patient from home with spouse complains of SOB and has progressive bladder cancer. Palliative consulted and charted patient has a prognosis >2 weeks. Patient/patient's family interested in Residential Hospice.    Social Worker assessment / plan:  CSW spoke with patient, patient's wife and daughter at bedside regarding interest in residential hospice. Patient's wife inquired about the difference between residential and home hospice, CSW explained. Patient's wife reported that they are interested in [facility] because of the close proximity to their home. CSW explained the process and contacted [facility] and notified her of family's interest.   CSW will continue to follow and assist patient with discharge planning.   Patient accepted to [facility] today, MD notified.   Employment status:  Retired Nurse, adult PT Recommendations:  Not assessed at this time Information / Referral to  community resources:  Other (Comment Required) ([place])  Patient/Family's Response to care:  Patient/patient's family agreeable to residential hospice.   Patient/Family's Understanding of and Emotional Response to Diagnosis, Current Treatment, and Prognosis:  Patient and patient's family verbalized understanding of patient's prognosis and plan to discharge to residential hospice. Patient and patient's family presented pleasant and appreciative throughout assessment. CSW inquired about patient's recent discharge home and patient reported that his wife was a great therapist. Patient's wife reported that their daughters have been a great support. CSW provided emotional support and informed patient/patient's family that they could call with any questions/concerns.   Emotional Assessment Appearance:  Appears stated age Attitude/Demeanor/Rapport:  Other (Cooperative) Affect (typically observed):  Calm Orientation:  Oriented to Self, Oriented to Place, Oriented to  Time, Oriented to Situation Alcohol / Substance use:  Not Applicable Psych involvement (Current and /or in the community):  No (Comment)  Discharge Needs  Concerns to be addressed:  No discharge needs identified Readmission within the last 30 days:  Yes Current discharge risk:  None Barriers to Discharge:  No Barriers Identified   Burnis Medin, LCSW 08/05/2017, 12:00 PM

## 2017-08-05 NOTE — Care Management Important Message (Signed)
Important Message  Patient Details  Name: Braedon Sjogren MRN: [id number] Date of Birth: [birth year]   Medicare Important Message Given:  Yes    Kerin Salen 08/05/2017, 10:31 AMImportant Message  Patient Details  Name: Marquice Uddin MRN: [id number] Date of Birth: Mar 02, 1924   Medicare Important Message Given:  Yes    Kerin Salen 08/05/2017, 10:31 AM

## 2017-08-05 NOTE — Progress Notes (Signed)
PT Note / Screen  Patient Details Name: Derrick Rivera MRN: [id number] DOB: 12-13-1923   Cancelled Treatment:    Reason Eval/Treat Not Completed: PT screened, no needs identified, will sign off Per palliative care notes, pt has poor prognosis and rapidly progressing cancer.  Plan is for full comfort measures and d/c plan to residential hospice.  Pt does not appear to have a need for acute PT evaluation at this time.  PT to sign off.   Rea Kalama,KATHrine E 08/05/2017, 12:12 PM Carmelia Bake, PT, DPT 08/05/2017 Pager: [phone]

## 2017-08-05 NOTE — Progress Notes (Signed)
FUll note to follow, discussed code status with patient, he wants to be DNR, Dr.Shadad recommended Hospice  Domenic Polite, MD

## 2017-08-07 ENCOUNTER — Ambulatory Visit: Payer: Medicare HMO

## 2017-08-07 ENCOUNTER — Other Ambulatory Visit: Payer: Medicare HMO

## 2017-08-07 NOTE — Progress Notes (Signed)
°  Radiation Oncology         [phone]) [phone] ________________________________  Name: Derrick Rivera MRN: [id number]  Date: 08/05/2017  DOB: [birth year]  End of Treatment Note  Diagnosis:  83 yo man with localized muscle invasive bladder cancer.     Indication for treatment:  Curative, Chemoradiation       Radiation treatment dates:   None.  Site/dose:   Planned to treat the bladder and pelvic nodes to 45 Gy in 25 fractions, followed by a boost to the bladder tumor to a total dose of 64.8 Gy.  Beams/energy:   Treatment was not started  Narrative: The patient did not begin treatment because he developed rapid progression of disease and enrolled in hospice.  Plan: I advised him to call if he has any questions or concerns. ________________________________  Sheral Apley Tammi Klippel, M.D.   This document serves as a record of services personally performed by Tyler Pita, MD. It was created on his behalf by Arlyce Harman, a trained medical scribe. The creation of this record is based on the scribe's personal observations and the provider's statements to them. This document has been checked and approved by the attending provider.

## 2017-08-08 LAB — CULTURE, BLOOD (ROUTINE X 2)
CULTURE: NO GROWTH
Culture: NO GROWTH
SPECIAL REQUESTS: ADEQUATE
Special Requests: ADEQUATE

## 2017-08-09 LAB — CULTURE, BODY FLUID W GRAM STAIN -BOTTLE

## 2017-08-09 LAB — CULTURE, BODY FLUID-BOTTLE: CULTURE: NO GROWTH

## 2017-08-10 ENCOUNTER — Ambulatory Visit: Payer: Medicare HMO | Admitting: Radiation Oncology

## 2017-08-11 ENCOUNTER — Ambulatory Visit: Payer: Medicare HMO

## 2017-08-12 ENCOUNTER — Ambulatory Visit: Payer: Medicare HMO

## 2017-08-13 ENCOUNTER — Ambulatory Visit: Payer: Medicare HMO

## 2017-08-14 ENCOUNTER — Ambulatory Visit: Payer: Medicare HMO

## 2017-08-14 ENCOUNTER — Other Ambulatory Visit: Payer: Medicare HMO

## 2017-08-14 ENCOUNTER — Ambulatory Visit: Payer: Medicare HMO | Admitting: Oncology

## 2017-08-17 ENCOUNTER — Ambulatory Visit: Payer: Medicare HMO

## 2017-08-18 ENCOUNTER — Ambulatory Visit: Payer: Medicare HMO

## 2017-08-19 ENCOUNTER — Ambulatory Visit: Payer: Medicare HMO

## 2017-08-20 ENCOUNTER — Ambulatory Visit: Payer: Medicare HMO

## 2017-08-21 ENCOUNTER — Ambulatory Visit: Payer: Medicare HMO

## 2017-08-21 ENCOUNTER — Other Ambulatory Visit: Payer: Medicare HMO

## 2017-08-24 ENCOUNTER — Ambulatory Visit: Payer: Medicare HMO

## 2017-08-25 ENCOUNTER — Ambulatory Visit: Payer: Medicare HMO

## 2017-08-26 ENCOUNTER — Ambulatory Visit: Payer: Medicare HMO

## 2017-08-27 ENCOUNTER — Ambulatory Visit: Payer: Medicare HMO

## 2017-08-28 ENCOUNTER — Ambulatory Visit: Payer: Medicare HMO

## 2017-08-28 ENCOUNTER — Other Ambulatory Visit: Payer: Medicare HMO

## 2017-08-31 ENCOUNTER — Ambulatory Visit: Payer: Medicare HMO

## 2017-08-31 DEATH — deceased

## 2017-09-01 ENCOUNTER — Ambulatory Visit: Payer: Medicare HMO

## 2017-09-02 ENCOUNTER — Ambulatory Visit: Payer: Medicare HMO

## 2017-09-03 ENCOUNTER — Ambulatory Visit: Payer: Medicare HMO

## 2017-09-04 ENCOUNTER — Ambulatory Visit: Payer: Medicare HMO

## 2017-09-04 ENCOUNTER — Other Ambulatory Visit: Payer: Medicare HMO

## 2017-09-07 ENCOUNTER — Ambulatory Visit: Payer: Medicare HMO

## 2017-09-08 ENCOUNTER — Ambulatory Visit: Payer: Medicare HMO

## 2017-09-08 ENCOUNTER — Telehealth: Payer: Self-pay | Admitting: Cardiovascular Disease

## 2017-09-08 NOTE — Telephone Encounter (Signed)
Derrick Rivera passed away on Aug 30, 2017

## 2017-09-09 ENCOUNTER — Ambulatory Visit: Payer: Medicare HMO

## 2017-09-09 ENCOUNTER — Ambulatory Visit: Payer: Medicare HMO | Admitting: Cardiovascular Disease

## 2017-09-10 ENCOUNTER — Ambulatory Visit: Payer: Medicare HMO

## 2017-09-11 ENCOUNTER — Ambulatory Visit: Payer: Medicare HMO

## 2017-09-11 ENCOUNTER — Other Ambulatory Visit: Payer: Medicare HMO

## 2017-09-14 ENCOUNTER — Ambulatory Visit: Payer: Medicare HMO

## 2017-09-15 ENCOUNTER — Ambulatory Visit: Payer: Medicare HMO

## 2017-09-16 ENCOUNTER — Ambulatory Visit: Payer: Medicare HMO

## 2017-09-17 ENCOUNTER — Ambulatory Visit: Payer: Medicare HMO

## 2017-09-18 ENCOUNTER — Ambulatory Visit: Payer: Medicare HMO

## 2017-09-21 ENCOUNTER — Ambulatory Visit: Payer: Medicare HMO

## 2017-09-22 ENCOUNTER — Ambulatory Visit: Payer: Medicare HMO

## 2017-09-23 ENCOUNTER — Ambulatory Visit: Payer: Medicare HMO

## 2017-09-24 ENCOUNTER — Ambulatory Visit: Payer: Medicare HMO

## 2017-09-25 ENCOUNTER — Ambulatory Visit: Payer: Medicare HMO

## 2017-09-28 ENCOUNTER — Ambulatory Visit: Payer: Medicare HMO

## 2018-04-03 IMAGING — CT CT L SPINE W/ CM
1 of 6 series · 6 of 14 positions shown, 8 images · non-contrast
Comparison: None

CLINICAL DATA: Low back pain and BILATERAL leg pain. Symptoms are
exacerbated by standing. Intermittent claudication, symptoms for 3
months, but worse recently.
TECHNIQUE: Contiguous axial images were obtained through the Lumbar spine after
the intrathecal infusion of infusion. Coronal and sagittal
reconstructions were obtained of the axial image sets.

[Series 3: l spine soft · axial · 0.31mm/px · z∈[-250,-70]mm · 6 of 84 slices shown, 8 images]
[im 12/84  soft-tissue]
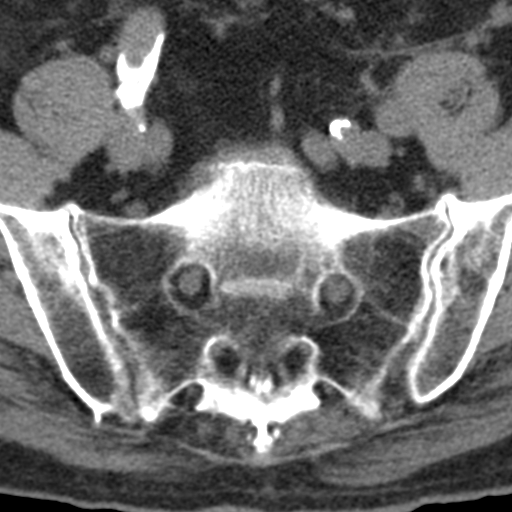
[im 12/84  bone]
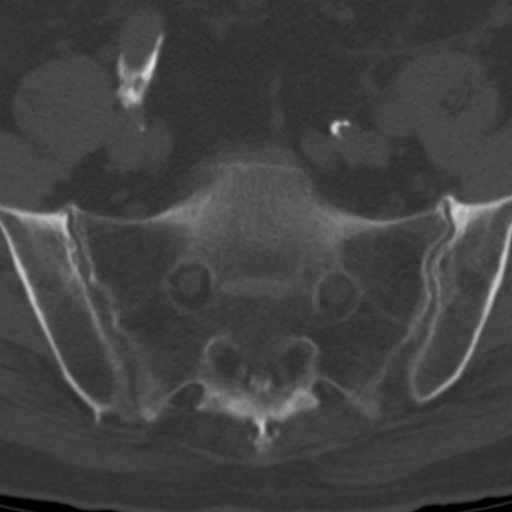
[im 24/84  bone]
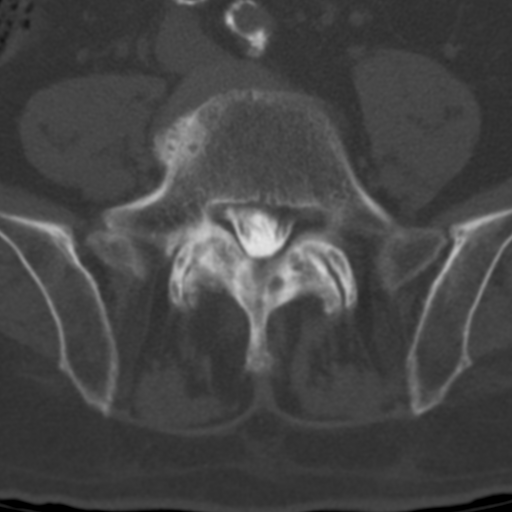
[im 36/84  bone]
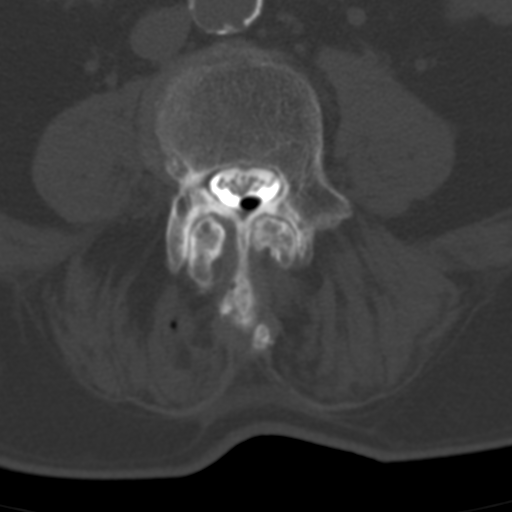
[im 48/84  bone]
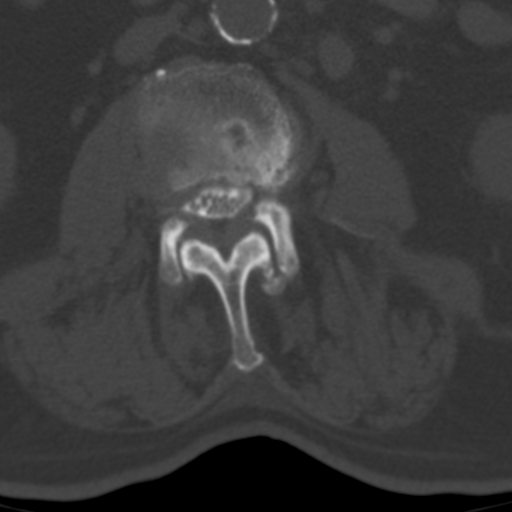
[im 60/84  soft-tissue]
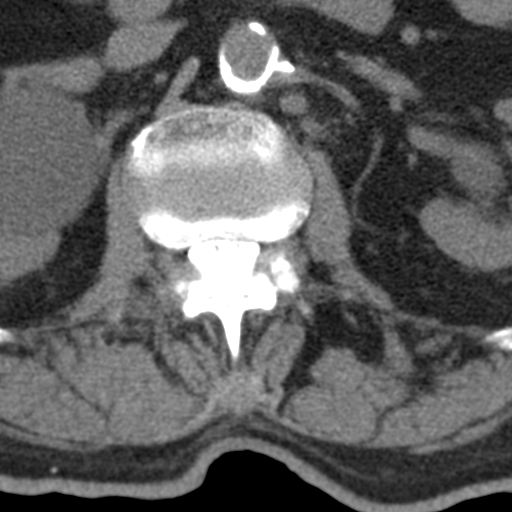
[im 60/84  bone]
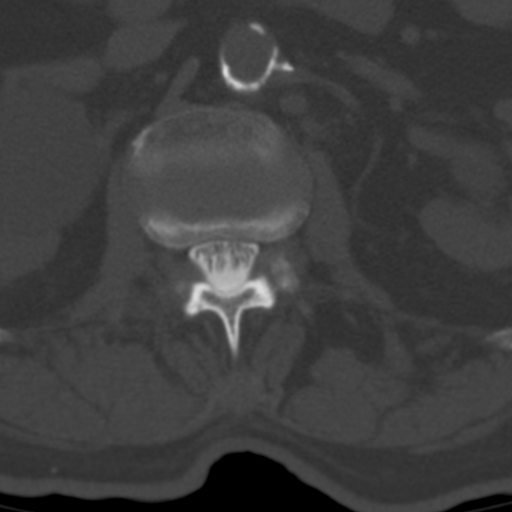
[im 72/84  bone]
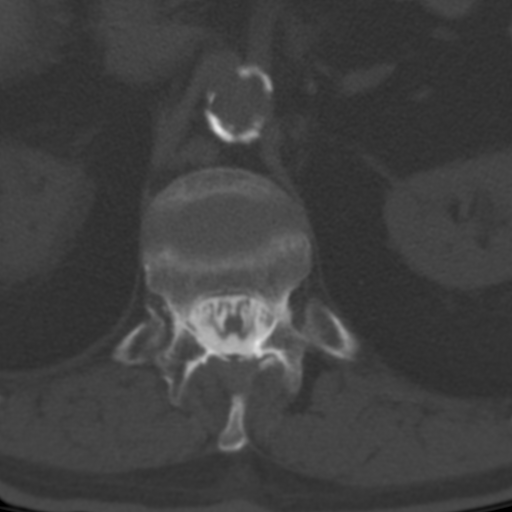

[6 of 14 positions shown; findings below may reference images not displayed]

EXAM:
LUMBAR MYELOGRAM

FLUOROSCOPY TIME:  2 minutes and 7 seconds corresponding to a Dose
Area Product of 477.47?microGy*m2

PROCEDURE:
After thorough discussion of risks and benefits of the procedure
including bleeding, infection, injury to nerves, blood vessels,
adjacent structures as well as headache and CSF leak, written and
oral informed consent was obtained. Consent was obtained by Dr. Sosa
Sundhu. Time out form was completed.

Patient was positioned prone on the fluoroscopy table. Local
anesthesia was provided with 1% lidocaine without epinephrine after
prepped and draped in the usual sterile fashion.

There were initial unsuccessful attempts at L2-3 and L3-4 on the
RIGHT. Successful lumbar puncture was performed at L5-S1 using a 3
1/2 inch 22-gauge spinal needle via LEFT paramedian approach. Using
a single pass through the dura, the needle was placed within the
thecal sac, with return of clear CSF. 15 mL of Isovue-M 200 was
injected into the thecal sac, with normal opacification of the nerve
roots and cauda equina consistent with free flow within the
subarachnoid space.

I personally performed the lumbar puncture and administered the
intrathecal contrast. I also personally supervised acquisition of
the myelogram images.
FINDINGS: LUMBAR MYELOGRAM FINDINGS:

Good opacification lumbar subarachnoid space. There is mild
degenerative scoliosis convex RIGHT mid lumbar region, related to
asymmetric loss of interspace height at L2-3 and L3-4 on the LEFT.
There is severe stenosis at L4-5, moderate stenosis at L3-4, and
mild stenosis at L5-S1 and L2-3. LEFT greater than RIGHT nerve root
impingement is seen at L2-3 and L3-4. BILATERAL RIGHT greater than
LEFT nerve root impingement is seen at L4-5. BILATERAL nerve root
effacement is seen at L5-S1, LEFT greater than RIGHT where there is
also advanced disc space narrowing. There is severe disc space
narrowing also at L3-4.

With patient prone for myelography, anterolisthesis at L4-5 measures
4 mm. With the patient standing in neutral, the anterolisthesis
increases to 6 mm. In upright flexion, the anterolisthesis is
unchanged, measuring 6 mm. In upright extension, the anterolisthesis
measures 5 mm.

CT LUMBAR MYELOGRAM FINDINGS:

Segmentation: Normal.

Alignment:  4 mm anterolisthesis with patient recumbent for CT.

Vertebrae: No worrisome osseous lesion.Scattered Schmorl's nodes.

Conus medullaris: Normal in size and location.

Paraspinal tissues: No evidence for hydronephrosis or paravertebral
mass. Advanced aortic atherosclerosis.

Disc levels:

T11-T12: Disc space narrowing. Shallow central protrusion. Posterior
element hypertrophy. Mild canal stenosis without conus compression.

T12-L1:  Unremarkable.

L1-L2: Annular bulge. Posterior element hypertrophy. BILATERAL
foraminal narrowing could affect either L1 nerve root but there is
no subarticular narrowing or significant stenosis.

L2-L3: Asymmetric loss of interspace height on the LEFT. Osseous
spurring. Annular bulge. Posterior element hypertrophy. Moderate
stenosis. LEFT greater than RIGHT L2 and L3 nerve root impingement.

L3-L4: Moderate to severe disc space narrowing, worse on the LEFT.
Central protrusion. Posterior element hypertrophy. Moderate
stenosis. LEFT greater than RIGHT L3 and L4 nerve root impingement.

L4-L5: 4 mm anterolisthesis. Uncovering of the disc with central
protrusion. Asymmetric loss of interspace height on the RIGHT.
Posterior element hypertrophy. Severe stenosis. RIGHT greater than
LEFT L4 and L5 nerve root impingement.

L5-S1: Advanced disc space narrowing. Central protrusion. Posterior
element hypertrophy. LEFT greater than RIGHT S1 nerve root
effacement. BILATERAL foraminal narrowing could affect either L5
nerve root.
IMPRESSION: LUMBAR MYELOGRAM IMPRESSION:

Dynamic instability, at L4-5, where degenerative spondylolisthesis
increases from 4 to 6 mm in standing flexion.

Severe stenosis at L4-5, with moderate stenosis at L3-4 and L2-3.

Degenerative scoliosis convex RIGHT, related to asymmetric loss of
interspace height at L2-3 and L3-4 on the LEFT.

CT LUMBAR MYELOGRAM IMPRESSION:

Severe stenosis at L4-5 is multifactorial, related to uncovering of
the disc with central protrusion, posterior element hypertrophy,
asymmetric RIGHT-sided with loss of interspace height, 4 mm slip,
and osseous spurring. RIGHT greater than LEFT L4 and L5 nerve root
impingement.

Similar less severe changes, moderate multifactorial stenosis, at
L2-3 and L3-4, worse on the LEFT due to loss of interspace height.

Central and leftward protrusion L5-S1. Advanced disc space
narrowing. BILATERAL foraminal narrowing and LEFT greater than RIGHT
subarticular zone narrowing.

## 2018-07-02 IMAGING — CT CT ABD-PELV W/ CM
1 series · 1 of 2 positions shown · IV contrast (isovue)
Comparison: 06/25/2017 abdominal and pelvic CT, 12/21/2008 chest CT
and other studies.

CLINICAL DATA: [AGE] male with acute shortness of breath,
abdominal and pelvic distention and unexpected weight loss. History
of bladder cancer undergoing BCG treatment. History of non-Hodgkin's
lymphoma.

EXAM:
CT ANGIOGRAPHY CHEST
CT ABDOMEN AND PELVIS WITH CONTRAST
TECHNIQUE: Multidetector CT imaging of the chest was performed using the
standard protocol during bolus administration of intravenous
contrast. Multiplanar CT image reconstructions and MIPs were
obtained to evaluate the vascular anatomy. Multidetector CT imaging
of the abdomen and pelvis was performed using the standard protocol
during bolus administration of intravenous contrast.
CONTRAST:  80 cc intravenous Isovue 370

[Series 1: topogram 0.6 t20f · coronal · 2.00mm/px · 1 of 2 slices shown]
[im 2/2]
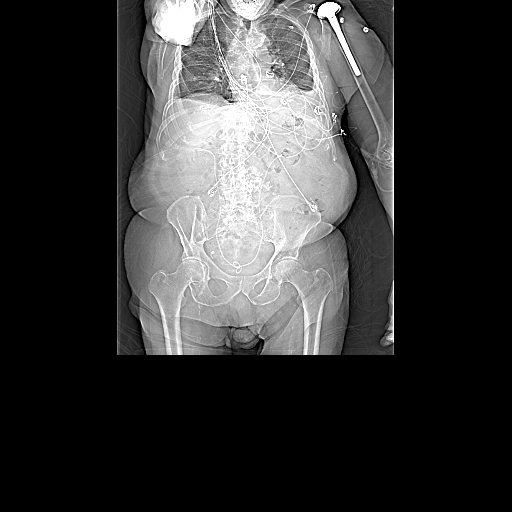

[1 of 2 positions shown; findings below may reference images not displayed]

FINDINGS: CTA CHEST FINDINGS

Cardiovascular: This is a technically satisfactory study. No
pulmonary emboli are identified. Cardiomegaly and heavy coronary
artery calcifications again noted. Diffuse aneurysm of the ascending
aorta again noted measuring 4 cm in greatest diameter. Aortic
atherosclerotic calcifications are again noted. A small pericardial
effusion is now identified.

Mediastinum/Nodes: New mildly enlarged enlarged mediastinal and
bilateral hilar lymph nodes are identified as follows (series 5):

A 1.2 cm AP window node (image 28)

A 1.6 x 2.5 cm subcarinal node (image 37) a 1.5 x 2.5 cm lower left
hilar node (image 42)

Other new much smaller lymph nodes within the mediastinum are
present.

Lungs/Pleura: A moderate to large left pleural effusion and small
right pleural effusion are identified. Bilateral lower lobe
atelectasis identified.No definite pulmonary mass is noted. There is
no evidence of pneumothorax.

Musculoskeletal: No acute or suspicious abnormality. Left shoulder
arthroplasty and degenerative changes in the right shoulder are
noted.

Review of the MIP images confirms the above findings.

CT ABDOMEN and PELVIS FINDINGS

Hepatobiliary: No acute or focal hepatic abnormality. Cholelithiasis
identified. No biliary dilatation.

Pancreas: Pancreatic atrophy without other significant abnormality

Spleen: Unremarkable

Adrenals/Urinary Tract: Bilateral ureteral stents are now identified
with tips in the renal pelvis and bladder. Severe right and moderate
left hydronephrosis again noted and not significantly changed.
Bilateral renal cortical atrophy again identified. The adrenal
glands are unremarkable.

Diffuse bladder wall thickening is again noted.

Stomach/Bowel: Irregular wall thickening of the proximal sigmoid
colon appears increased since the prior study. There is no evidence
of bowel obstruction, pneumoperitoneum or focal abscess.

Vascular/Lymphatic: Aortic atherosclerosis. New and enlarging
adenopathy within the retroperitoneum, abdomen and mesenteric noted
as follows (series 6):

A 2.1 cm left retroperitoneal node (image 33), previously 1.1 cm.

A 2 cm left retroperitoneal node (image 41), previously 1.4 cm.

Reproductive: Prostate unchanged

Other: Increasing diffuse ill-defined soft tissue within the pelvic
fat, omentum and mesenteric noted. A small amount of ascites
adjacent to the liver now noted. Heavy calcification within the
mesenteric again identified.

Musculoskeletal: No acute abnormalities.

Review of the MIP images confirms the above findings.
IMPRESSION: 1. Increasing and new adenopathy within the chest, abdomen and
pelvis and new ill-defined soft tissue opacities within the abdomen,
omentum, mesenteric and pelvis - highly suspicious for increasing
malignancy/metastatic disease and/or lymphoma. Diffuse bladder wall
thickening again noted.
2. Moderate left pleural effusion, small right pleural effusion,
small pericardial effusion and small amount of ascites.
3. Increasing diffuse wall thickening of the proximal sigmoid colon
-favor metastatic disease over primary malignancy.
4. No evidence of pulmonary emboli.
5. Unchanged 4 cm ascending thoracic aortic aneurysm
6. Bilateral urinary stents with unchanged severe right and moderate
left hydronephrosis.
7. Cardiomegaly and coronary artery disease.

## 2018-07-04 IMAGING — DX DG CHEST 1V
1 series · 1 of 1 positions shown · non-contrast
Comparison: Chest radiograph July 27, 2017 [address] chest CT August 02, 2017

CLINICAL DATA: Status post left-sided thoracentesis

EXAM:
CHEST 1 VIEW

[chest ap]
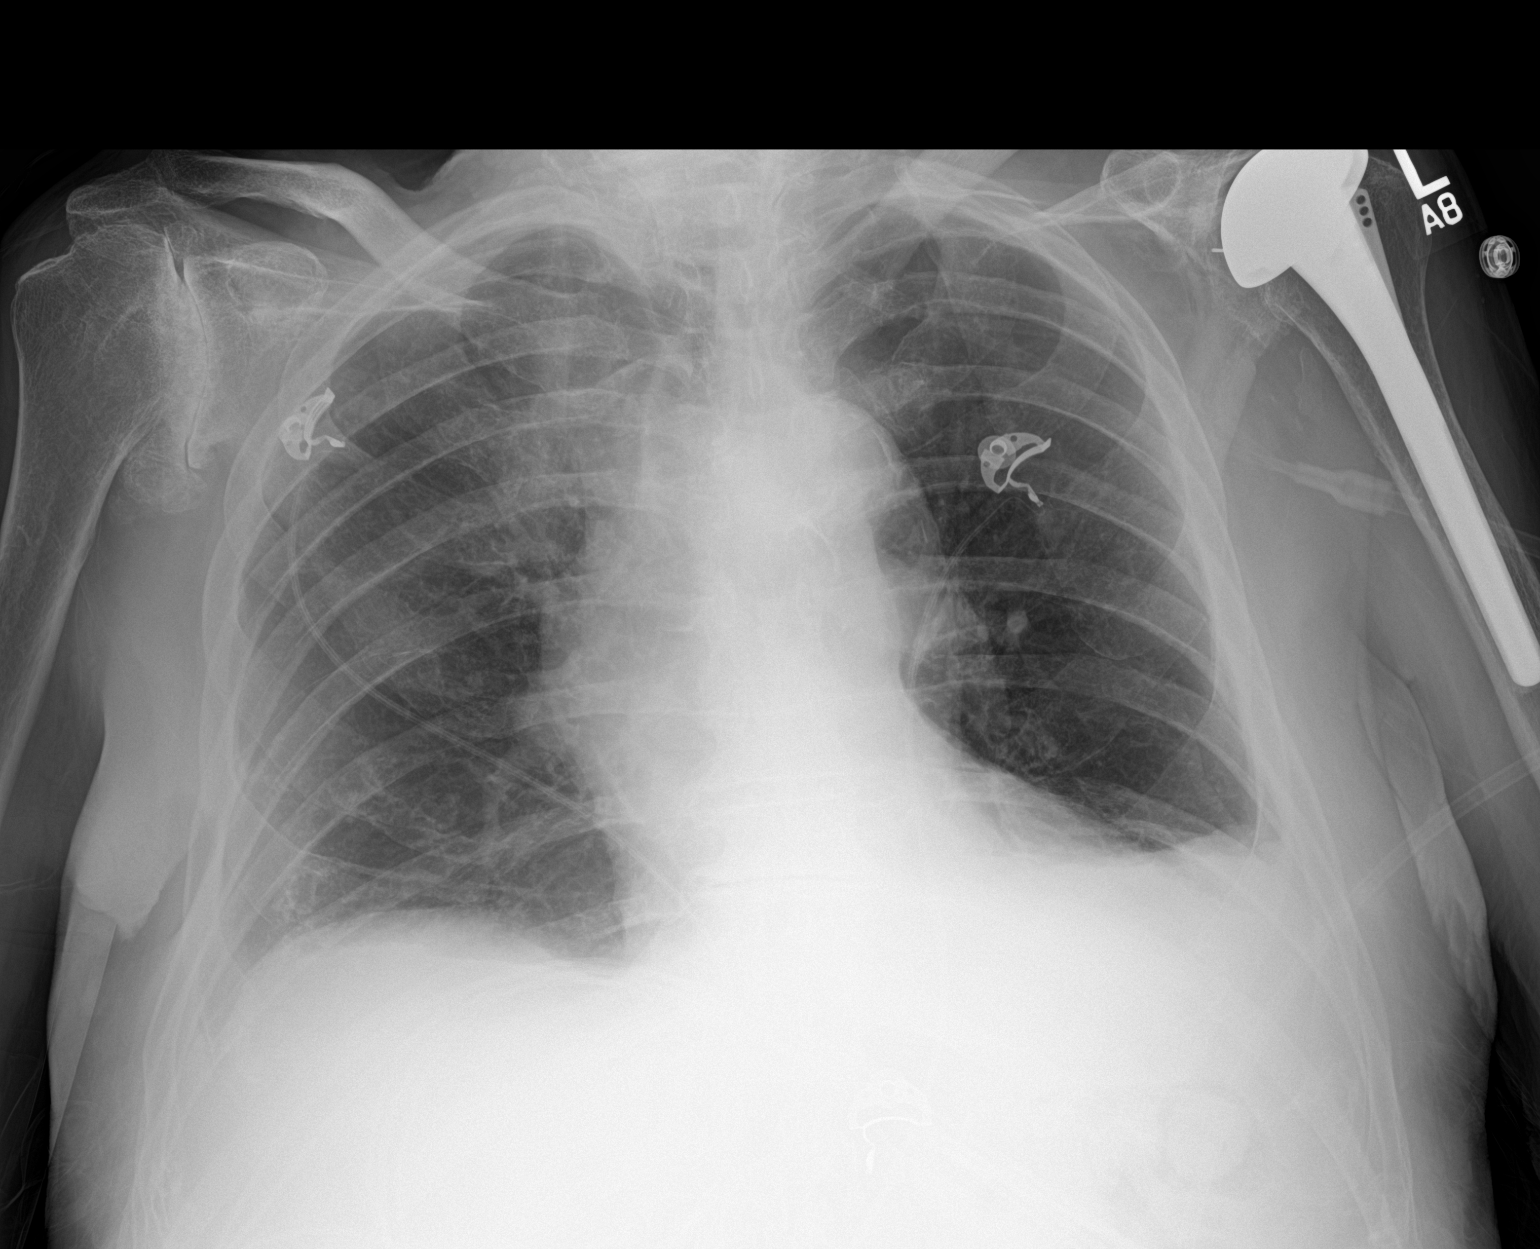

[1 of 1 positions shown; findings below may reference images not displayed]

FINDINGS: No pneumothorax. Left pleural effusion much smaller following
thoracentesis. There is also a small right pleural effusion. There
is mild bibasilar atelectasis.

There is no edema or consolidation. Heart size is normal. Pulmonary
vascular is normal. There is aortic atherosclerosis. No adenopathy.

There is a total shoulder replacement on the left. There is advanced
osteoarthritis in the right shoulder with remodeling along the
proximal right humerus.
IMPRESSION: No evident pneumothorax. There are small pleural effusions
bilaterally with bibasilar atelectasis.

There is aortic atherosclerosis.

Heart size normal.

Advanced arthropathy right shoulder. Total shoulder replacement on
the left.

Aortic Atherosclerosis ([id number]).
